# Patient Record
Sex: Male | Born: 1960 | Race: Black or African American | Hispanic: No | Marital: Single | Smoking: Current every day smoker
Health system: Southern US, Community
[De-identification: ages and names within clinical notes are randomized; demographics above are authoritative.]

## PROBLEM LIST (undated history)

## (undated) DIAGNOSIS — R569 Unspecified convulsions: Secondary | ICD-10-CM

## (undated) DIAGNOSIS — E538 Deficiency of other specified B group vitamins: Secondary | ICD-10-CM

## (undated) HISTORY — PX: NO PAST SURGERIES: SHX2092

---
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
## 2004-04-30 ENCOUNTER — Ambulatory Visit (HOSPITAL_COMMUNITY): Admission: RE | Admit: 2004-04-30 | Discharge: 2004-04-30 | Payer: Self-pay | Admitting: General Surgery

## 2004-12-07 ENCOUNTER — Emergency Department: Payer: Self-pay | Admitting: Emergency Medicine

## 2004-12-07 ENCOUNTER — Other Ambulatory Visit: Payer: Self-pay

## 2007-03-18 ENCOUNTER — Other Ambulatory Visit: Payer: Self-pay

## 2007-03-18 ENCOUNTER — Emergency Department: Payer: Self-pay | Admitting: Emergency Medicine

## 2008-07-16 ENCOUNTER — Ambulatory Visit (HOSPITAL_COMMUNITY): Admission: RE | Admit: 2008-07-16 | Discharge: 2008-07-16 | Payer: Self-pay | Admitting: Family Medicine

## 2008-07-16 IMAGING — CT CT HEAD W/O CM
1 series · 16 of 30 positions shown, 20 images · non-contrast
Comparison: None

CLINICAL DATA: Severe frontal headache.  Seizure disorder.

CT HEAD WITHOUT CONTRAST
TECHNIQUE: Contiguous axial images were obtained from the base of
the skull through the vertex without contrast

[Series 2: headseq 4.8 h37s · axial · 0.43mm/px · z∈[+132,+262]mm · 16 of 30 slices shown, 20 images]
[im 2/30  brain]
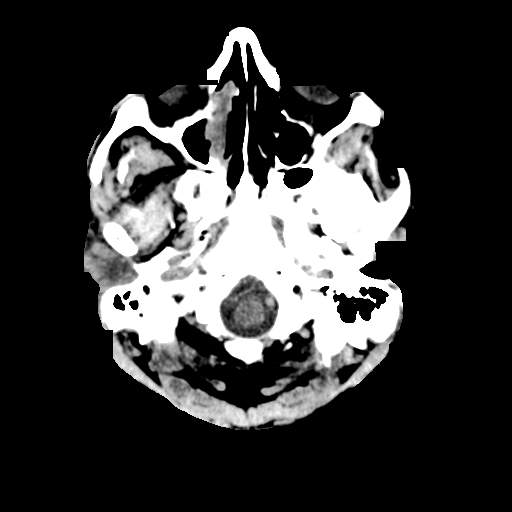
[im 2/30  bone]
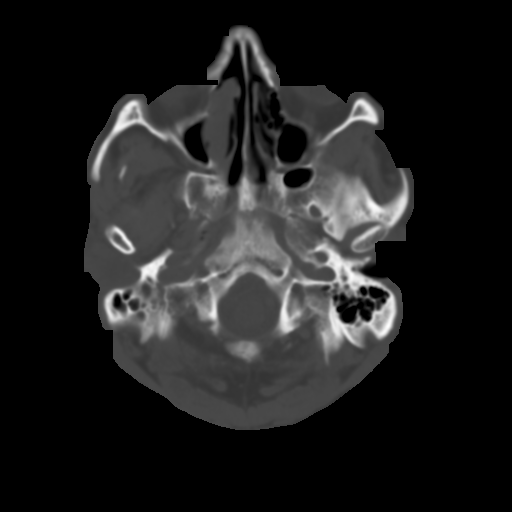
[im 4/30  brain]
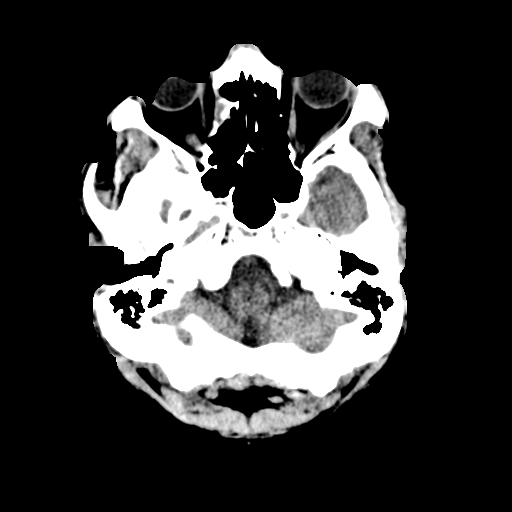
[im 6/30  brain]
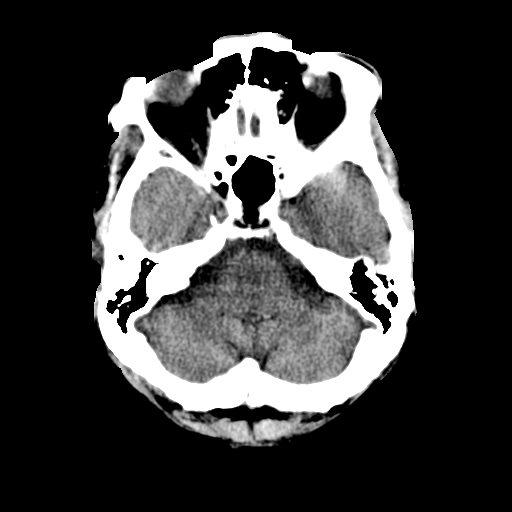
[im 8/30  brain]
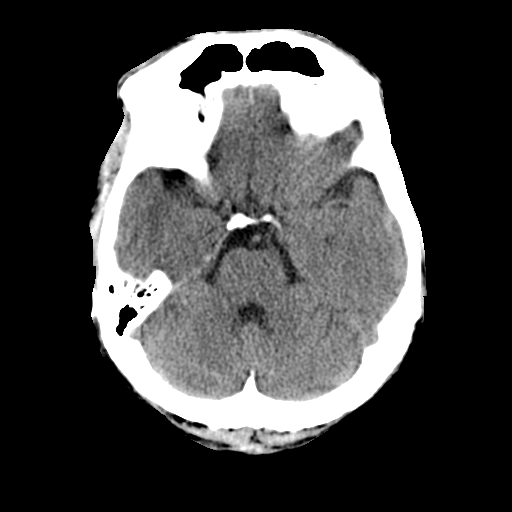
[im 9/30  brain]
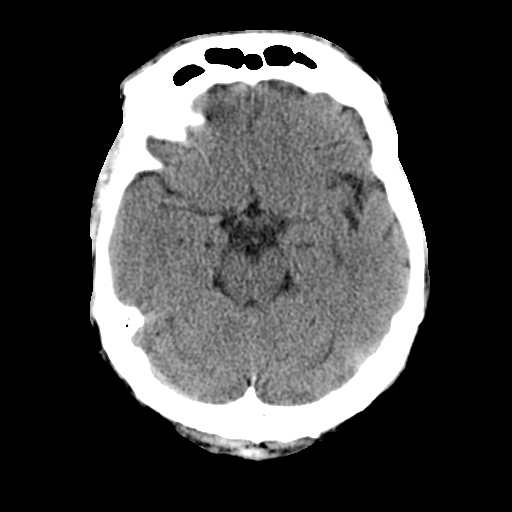
[im 9/30  bone]
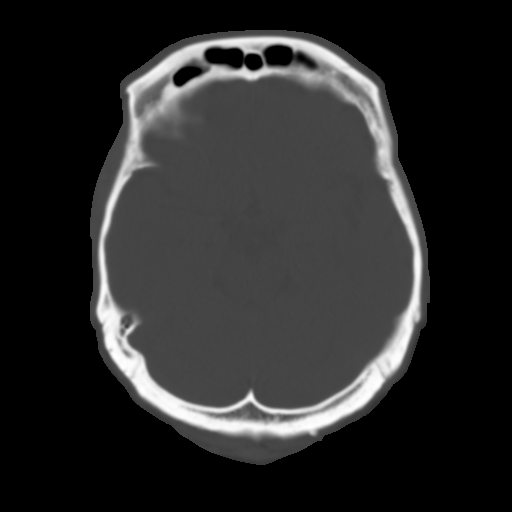
[im 11/30  brain]
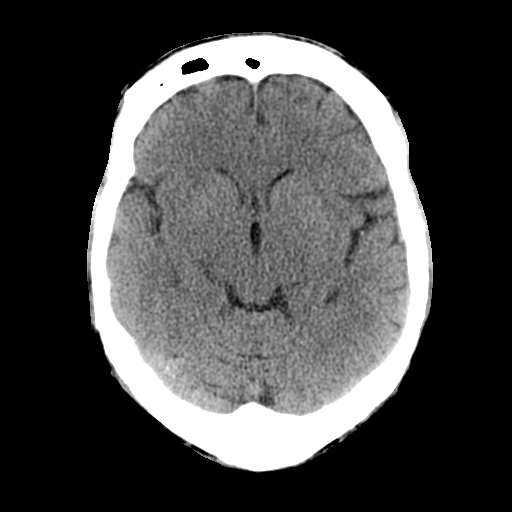
[im 13/30  brain]
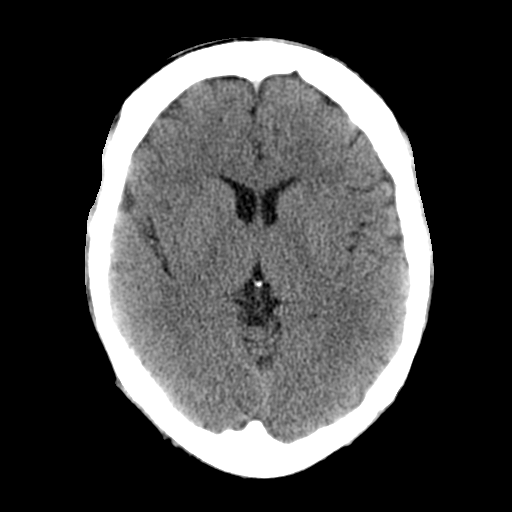
[im 15/30  brain]
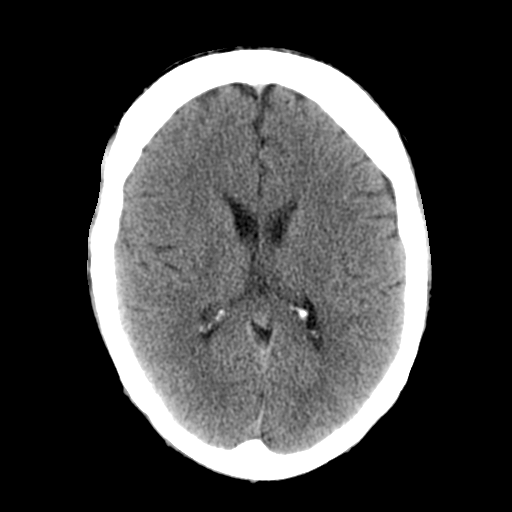
[im 16/30  brain]
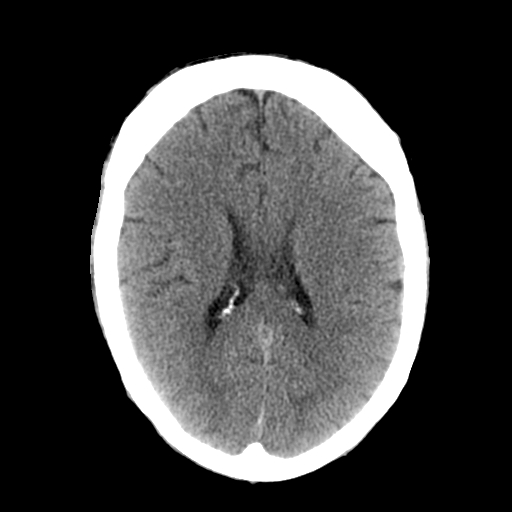
[im 16/30  bone]
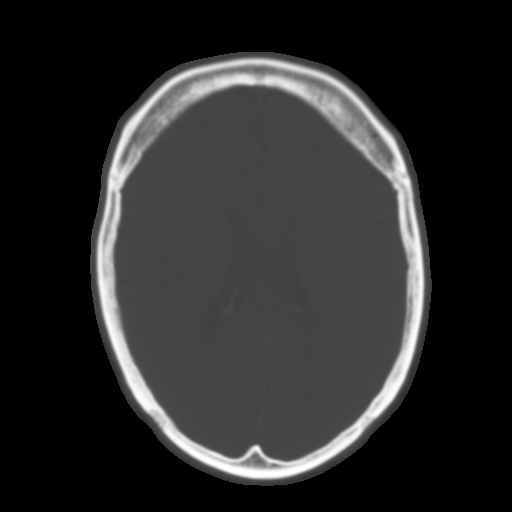
[im 18/30  brain]
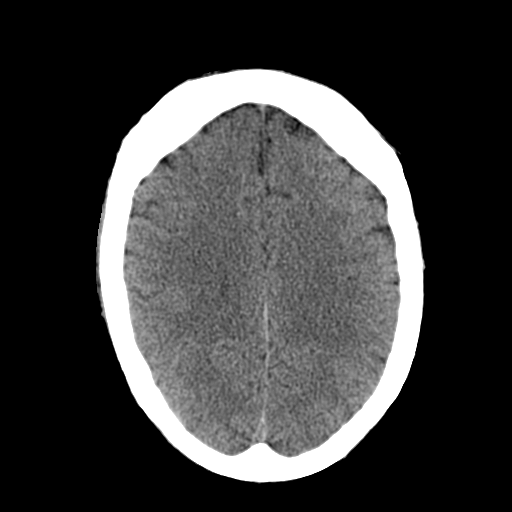
[im 20/30  brain]
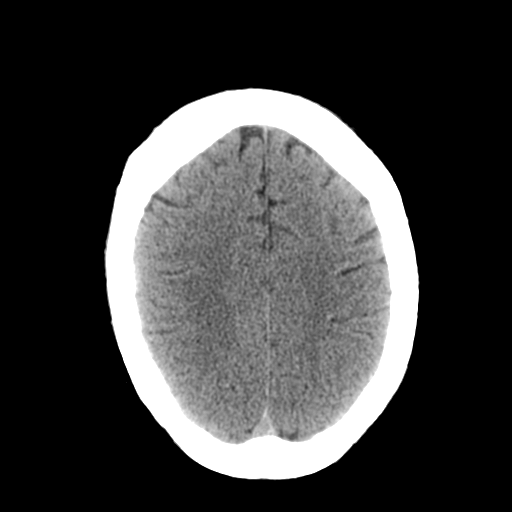
[im 22/30  brain]
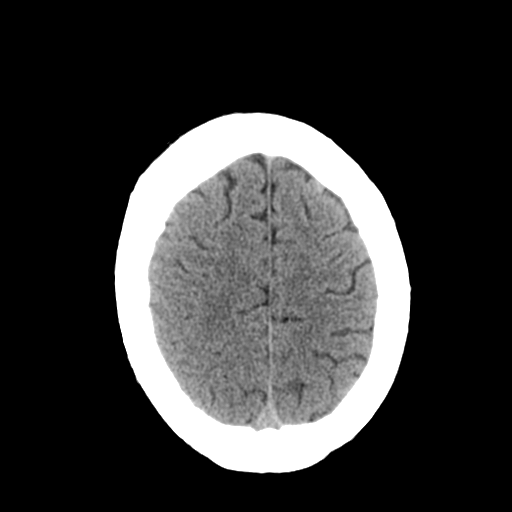
[im 23/30  brain]
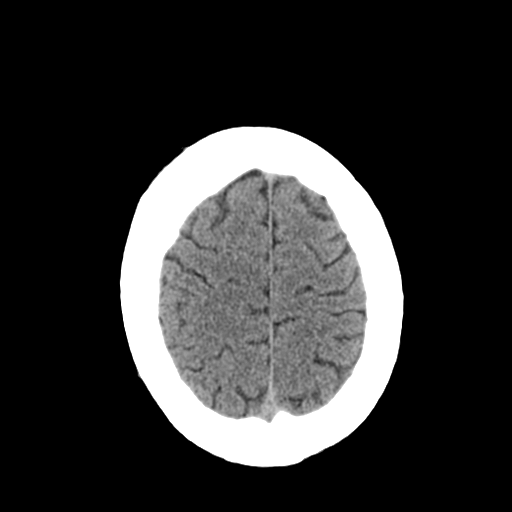
[im 23/30  bone]
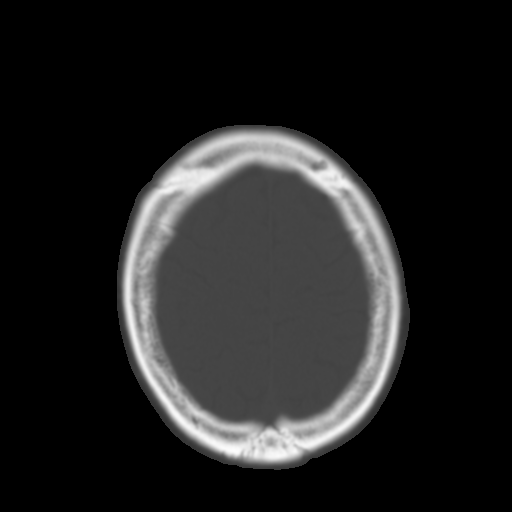
[im 25/30  brain]
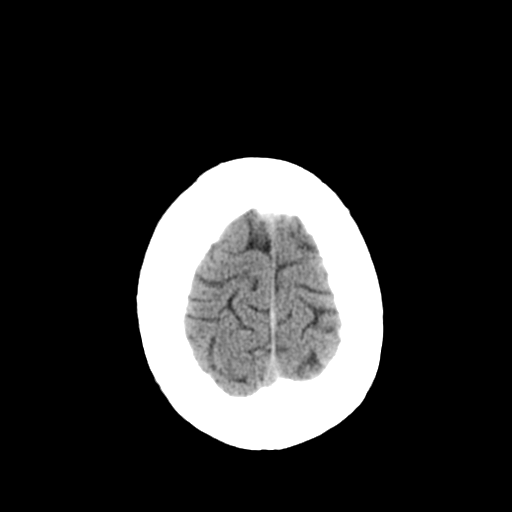
[im 27/30  brain]
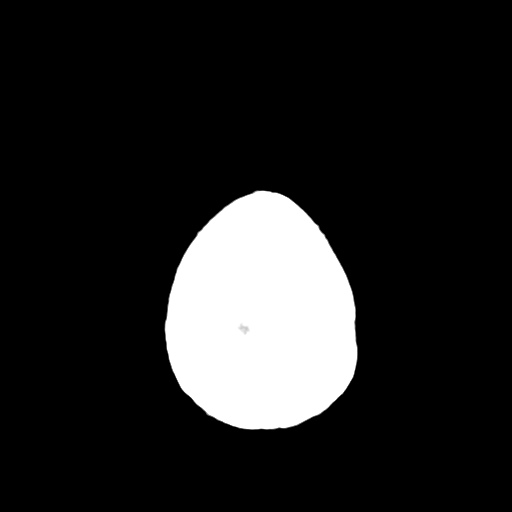
[im 29/30  brain]
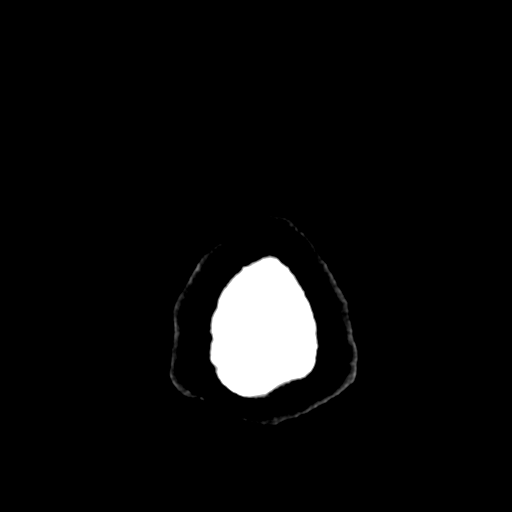

[16 of 30 positions shown; findings below may reference images not displayed]

FINDINGS: There is no evidence of intracranial hemorrhage, brain
edema, or other signs of acute infarction.  There is no evidence of
intracranial mass lesions, or mass effect.  No abnormal extraaxial
fluid collections are identified.  There is no evidence of
hydrocephalus, or other significant intracranial abnormality.  No
skull abnormality identified.
IMPRESSION: Negative non-contrast head CT.

## 2009-01-05 ENCOUNTER — Emergency Department: Payer: Self-pay | Admitting: Unknown Physician Specialty

## 2010-10-03 NOTE — H&P (Signed)
NAMEBROCKTON, MCKESSON NO.:  [id number]   MEDICAL RECORD NO.:  [id number]           PATIENT TYPE:   LOCATION:                                 FACILITY:   PHYSICIAN:  Dalia Heading, M.D.  DATE OF BIRTH:  [birth year]   DATE OF ADMISSION:  04/30/2004  DATE OF DISCHARGE:  LH                                HISTORY & PHYSICAL   CHIEF COMPLAINT:  Enlarging mass, face.   HISTORY OF PRESENT ILLNESS:  The patient is a 50 year old black male who  presents with an enlarging mass on his face.  It has been present for some  time but has recently increased in size.  No drainage has been noted.   PAST MEDICAL HISTORY:  Seizure disorder.  He has had seizures twice a month  since the age of 56.   PAST SURGICAL HISTORY:  Unremarkable.   CURRENT MEDICATIONS:  Dilantin 500 mg p.o. daily.  This was recently  increased.   ALLERGIES:  No known drug allergies.   REVIEW OF SYSTEMS:  The patient does drink and smoke.   PHYSICAL EXAMINATION:  GENERAL:  The patient is a well-developed, well-  nourished black male in no acute distress.  VITAL SIGNS:  He is afebrile, and vital signs are stable.  CHEST:  Lungs are clear to auscultation with equal breath sounds  bilaterally.  CARDIAC:  Regular rate and rhythm without S3, S4, or murmurs.  SKIN:  A 2 cm oval, subcutaneous, soft mass just lateral to the right eye.  A punctum may be present.  No drainage was noted.   IMPRESSION:  Enlarging mass, face.   PLAN:  The patient is scheduled for excision of the mass, face, on April 30, 2004.  The risks and benefits of the procedure, including bleeding,  infection, and seizures, were fully explained to the patient, who gave  informed consent.     Mark   MAJ/MEDQ  D:  04/22/2004  T:  04/22/2004  Job:  [id number]   cc:   [facility]  Fax: [phone]   Patrica Duel, M.D.  [address]  [place]  [place] [address]  Fax: [phone]

## 2010-10-03 NOTE — Op Note (Signed)
NAMERAYNER, ERMAN NO.:  [id number]   MEDICAL RECORD NO.:  [id number]          PATIENT TYPE:  AMB   LOCATION:  DAY                           FACILITY:  [facility]   PHYSICIAN:  Dalia Heading, M.D.  DATE OF BIRTH:  May 07, 1961   DATE OF PROCEDURE:  04/30/2004  DATE OF DISCHARGE:  04/30/2004                                 OPERATIVE REPORT   PREOPERATIVE DIAGNOSIS:  Enlarging mass, face.   POSTOPERATIVE DIAGNOSIS:  Inclusion cyst, face.   PROCEDURE:  Excision of cyst, face.   SURGEON:  Dr. Franky Macho   ANESTHESIA:  General.   INDICATIONS:  The patient is a 50 year old black male who has an enlarging  mass on his face, lateral to the right eye. The risks and benefits of the  procedure including bleeding, infection, and recurrence of the cyst were  fully explained to the patient, who gave informed consent.   PROCEDURE NOTE:  The patient was placed in the supine position. After  general anesthesia was administered, the right side of the face was prepped  and draped using the usual sterile technique with Betadine. Surgical site  confirmation was performed.   Incision was made over the mass. A sebaceous cyst was found. The cyst along  with its wall were removed without difficulty. The specimen was sent to  pathology for further examination.  Any bleeding was controlled using Bovie  electrocautery. The skin was reapproximated using 5-0 nylon interrupted  sutures. Neosporin ointment and dry sterile dressing were applied.   All tape and needle counts correct at the end of the procedure. The patient  was awakened and transferred to PACU in stable condition.   COMPLICATIONS:  None.   SPECIMEN:  Cyst, face.   BLOOD LOSS:  MinimalLoraine Leriche   MAJ/MEDQ  D:  06/03/2004  T:  06/03/2004  Job:  [id number]   cc:   Patrica Duel, M.D.  [address]  [place]  [place] [address]  Fax: [phone]

## 2013-02-27 ENCOUNTER — Ambulatory Visit (HOSPITAL_COMMUNITY)
Admission: RE | Admit: 2013-02-27 | Discharge: 2013-02-27 | Disposition: A | Discharge status: Home / Self Care | Payer: Medicaid Other | Source: Ambulatory Visit | Attending: Neurology | Admitting: Neurology

## 2013-02-27 ENCOUNTER — Other Ambulatory Visit: Payer: Self-pay | Admitting: Neurology

## 2013-02-27 DIAGNOSIS — M7989 Other specified soft tissue disorders: Secondary | ICD-10-CM | POA: Insufficient documentation

## 2013-02-27 DIAGNOSIS — M25562 Pain in left knee: Secondary | ICD-10-CM

## 2013-02-27 DIAGNOSIS — M79609 Pain in unspecified limb: Secondary | ICD-10-CM | POA: Insufficient documentation

## 2013-02-27 IMAGING — US US EXTREM LOW VENOUS*L*
1 series · 14 of 24 positions shown · non-contrast
Comparison: None.

CLINICAL DATA: Left leg pain and swelling

EXAM:
VENOUS DOPPLER ULTRASOUND OF LEFT LOWER EXTREMITY
TECHNIQUE: Gray-scale sonography with graded compression, as well as color
Doppler and duplex ultrasound, were performed to evaluate the deep
venous system from the level of the common femoral vein through the
popliteal and proximal calf veins. Spectral Doppler was utilized to
evaluate flow at rest and with distal augmentation maneuvers.

[Series 1: us extrem low venous*left* · 0.05mm/px · 14 of 28 slices shown]
[im 1/28]
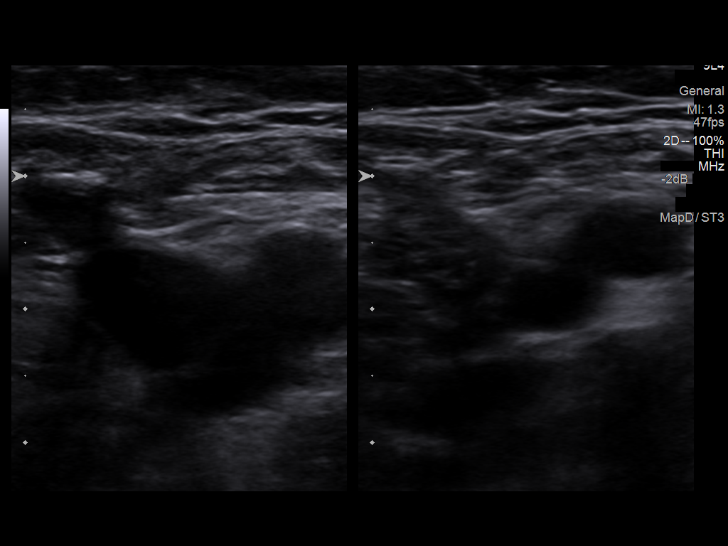
[im 3/28]
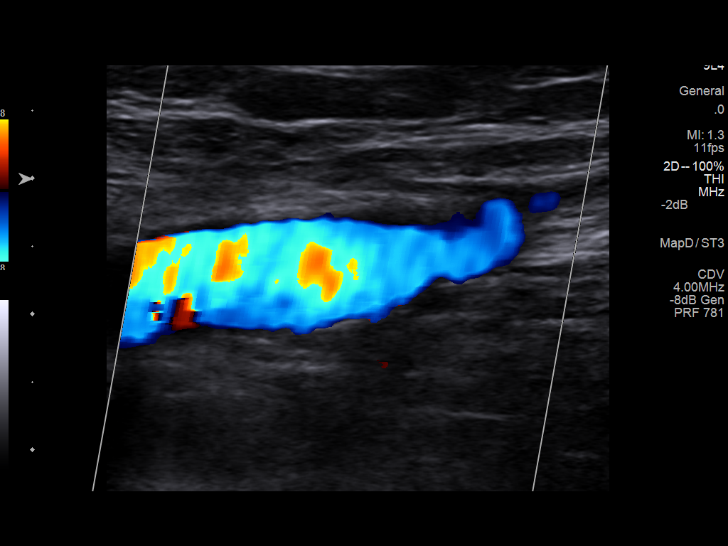
[im 5/28]
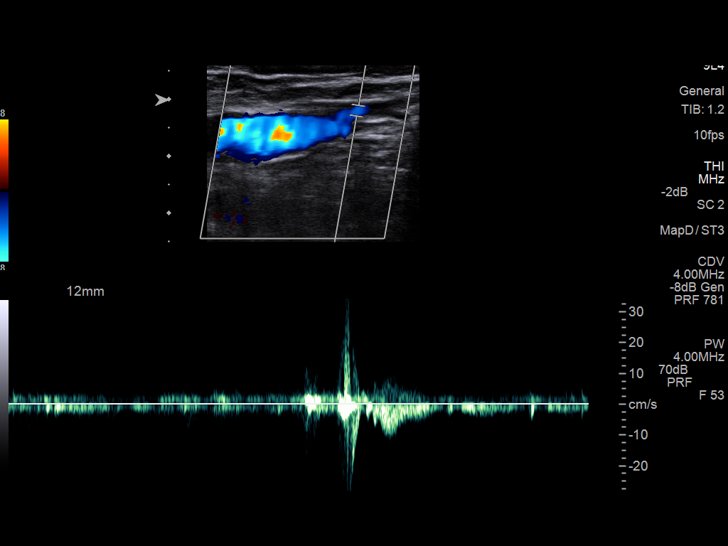
[im 8/28]
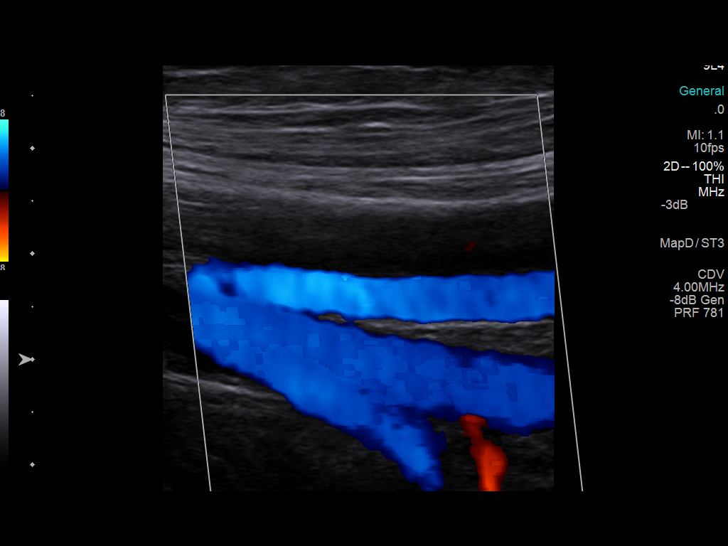
[im 9/28]
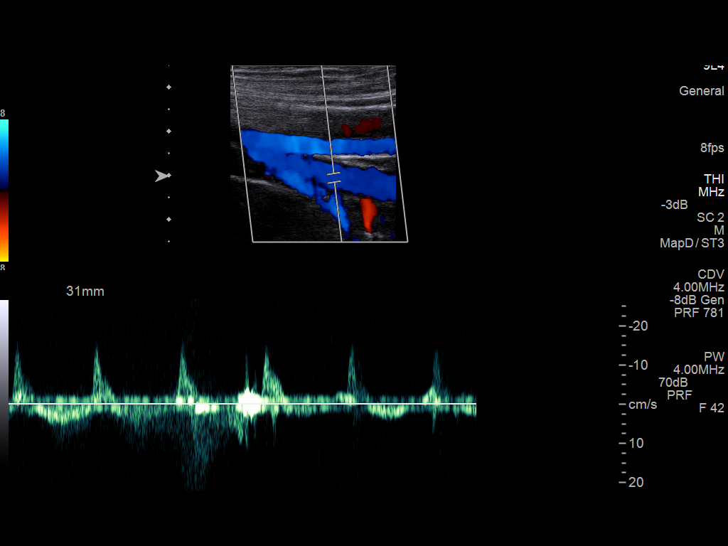
[im 11/28]
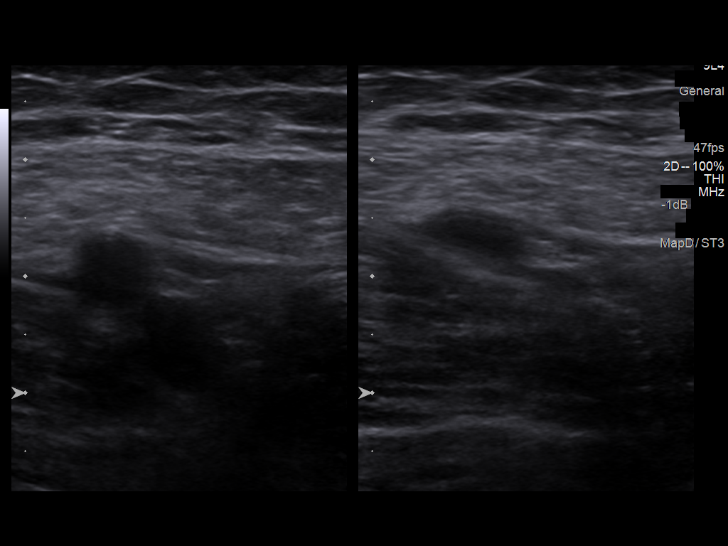
[im 13/28]
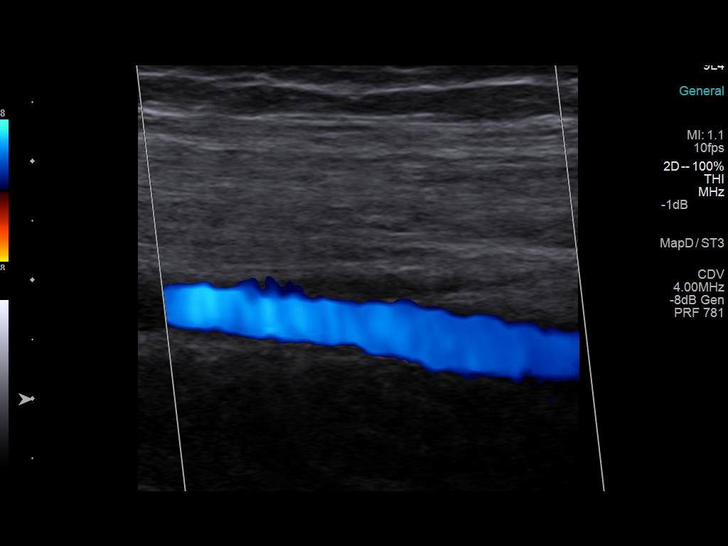
[im 15/28]
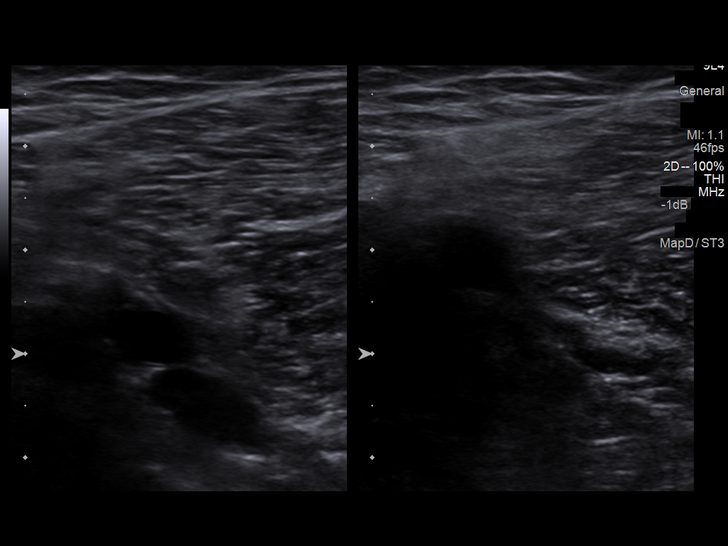
[im 17/28]
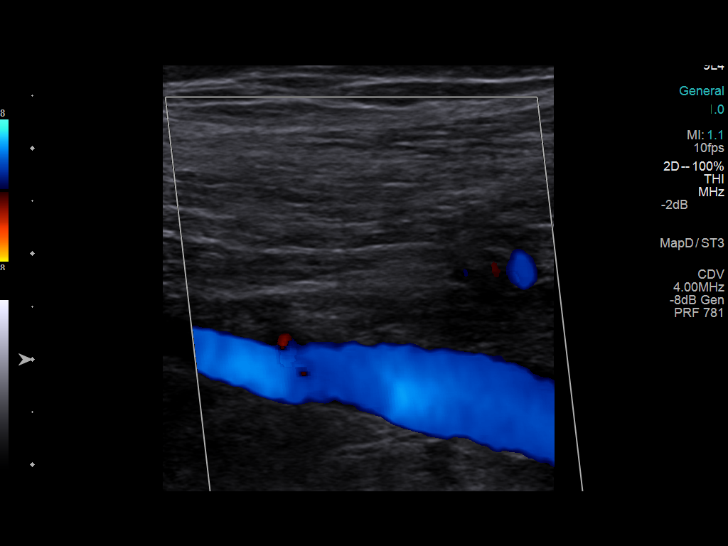
[im 19/28]
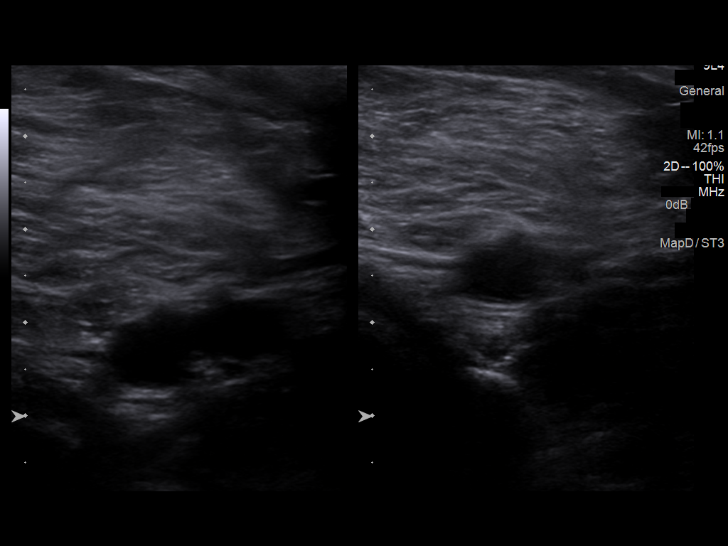
[im 22/28]
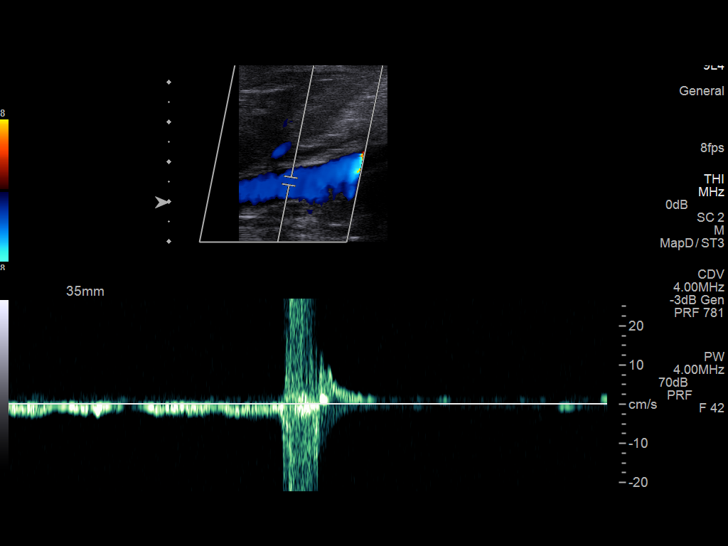
[im 23/28]
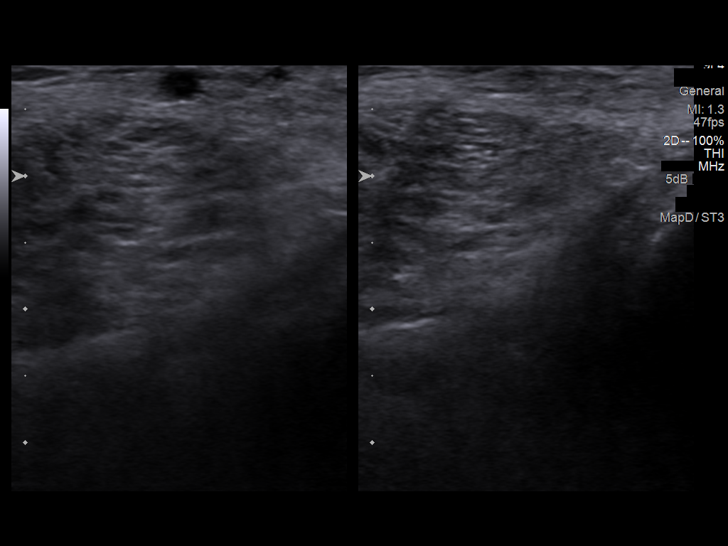
[im 25/28]
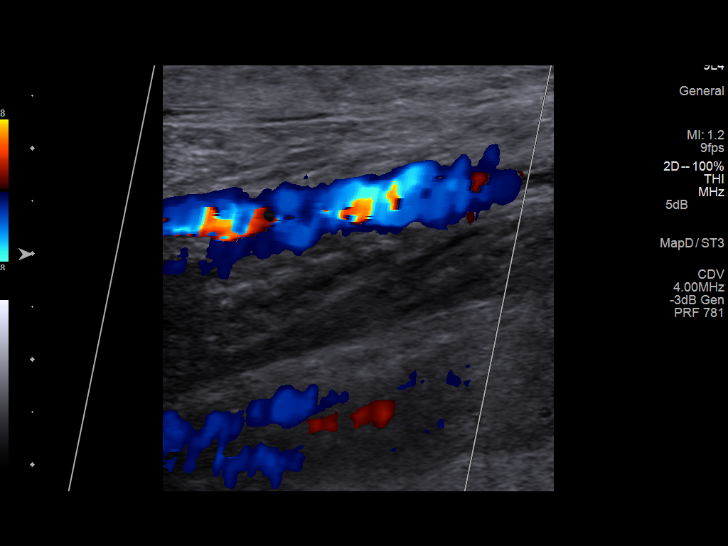
[im 28/28]
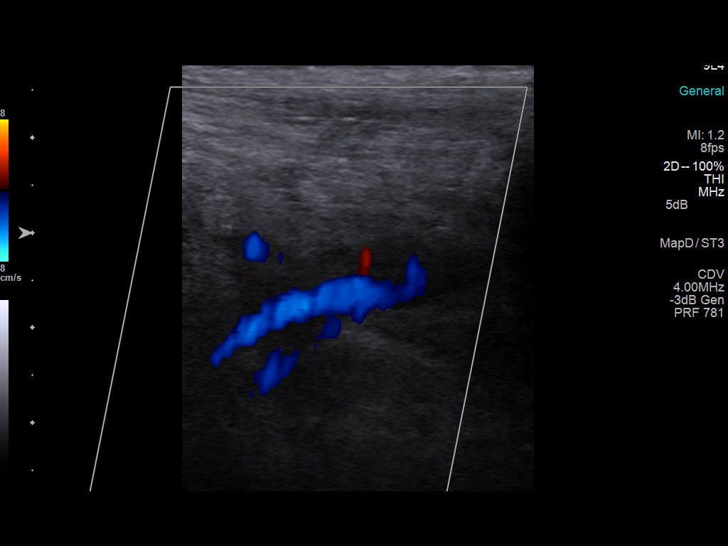

[14 of 24 positions shown; findings below may reference images not displayed]

FINDINGS: Thrombus within deep veins:  None visualized.

Compressibility of deep veins:  Normal.

Duplex waveform respiratory phasicity:  Normal.

Duplex waveform response to augmentation:  Normal.

Venous reflux:  None visualized.

Other findings:  None visualized.
IMPRESSION: No evidence of deep venous thrombosis in the left lower extremity.

## 2018-05-03 IMAGING — US RIGHT LOWER EXTREMITY VENOUS ULTRASOUND
1 series · 13 of 24 positions shown · non-contrast
Comparison: None.

CLINICAL DATA: Right lower extremity swelling for 3 weeks



[Series 1: right lower extremity venous ultrasound · 0.07mm/px · 13 of 35 slices shown]
[im 1/35]
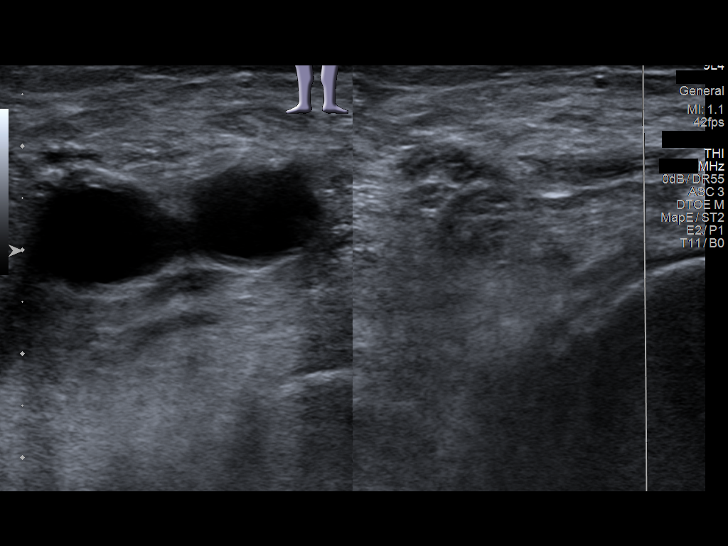
[im 3/35]
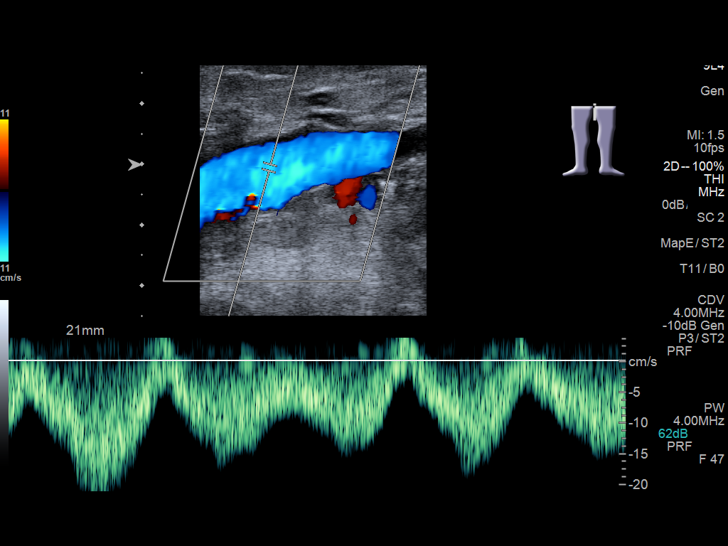
[im 6/35]
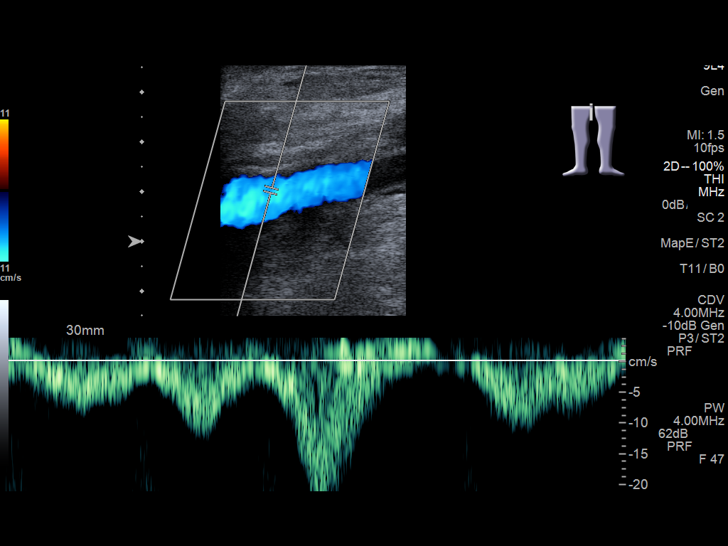
[im 9/35]
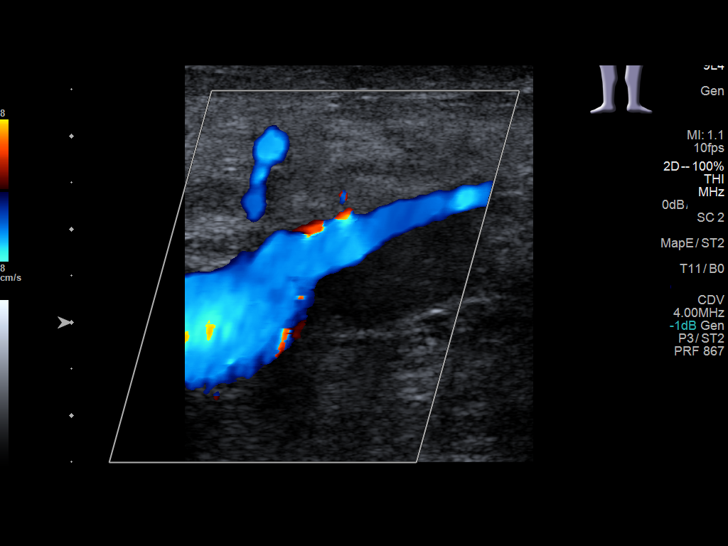
[im 12/35]
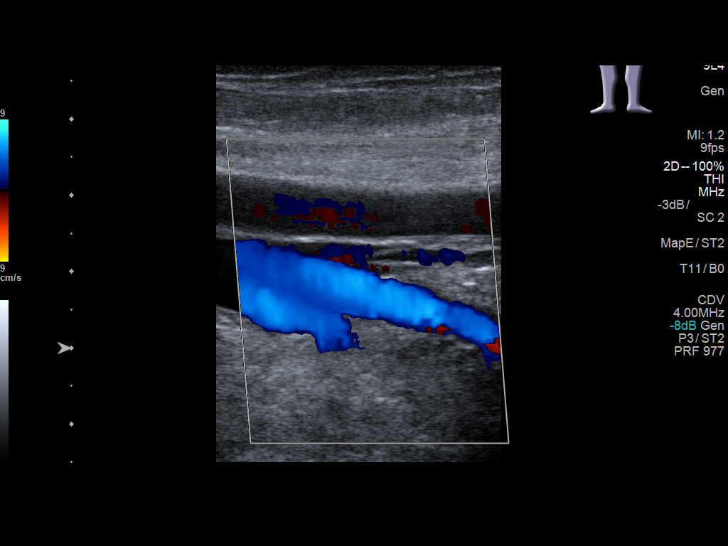
[im 15/35]
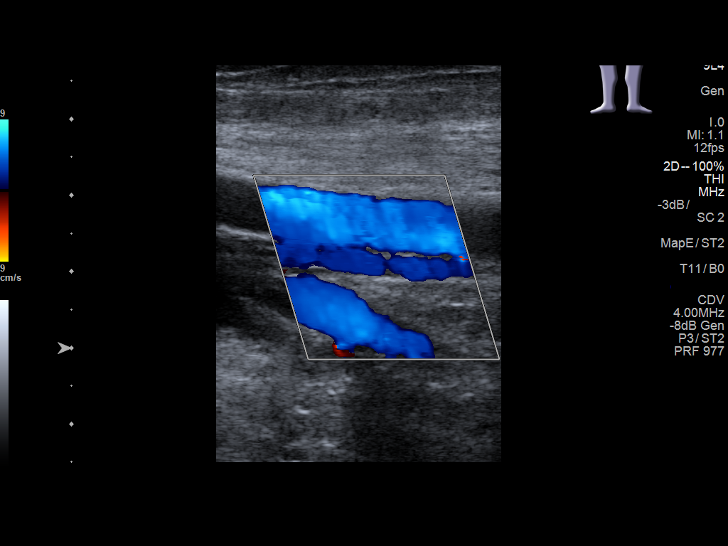
[im 18/35]
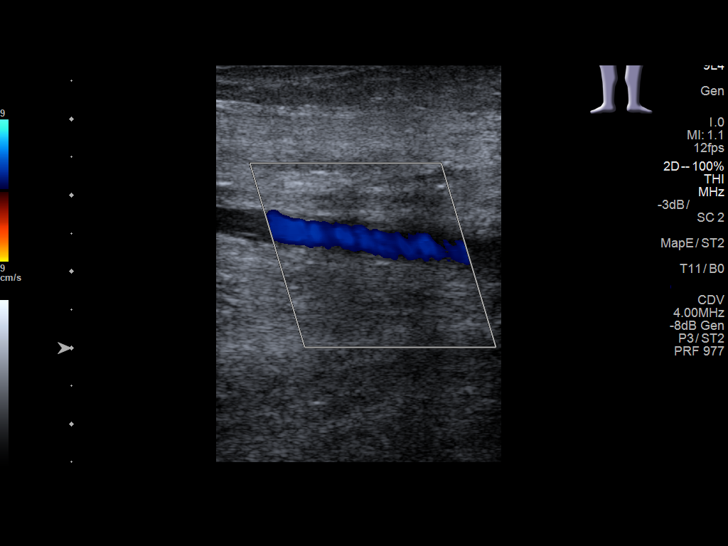
[im 20/35]
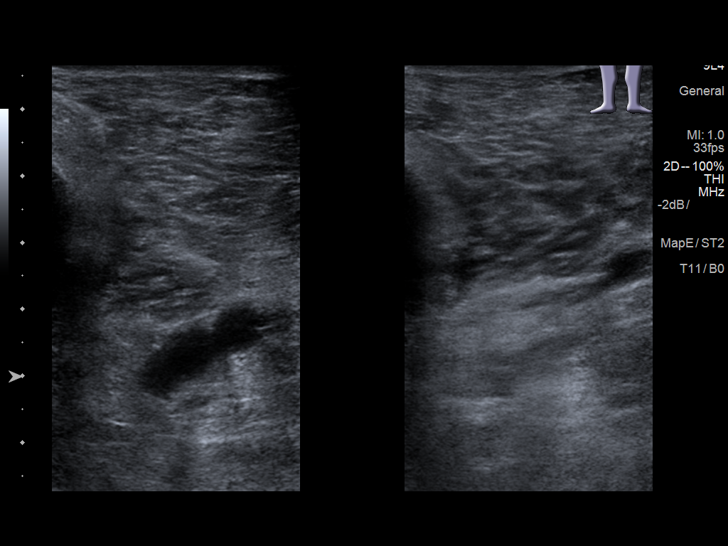
[im 23/35]
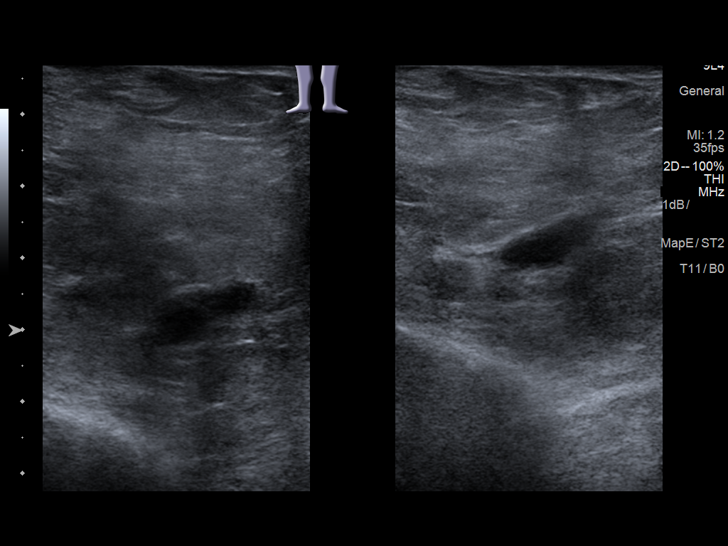
[im 26/35]
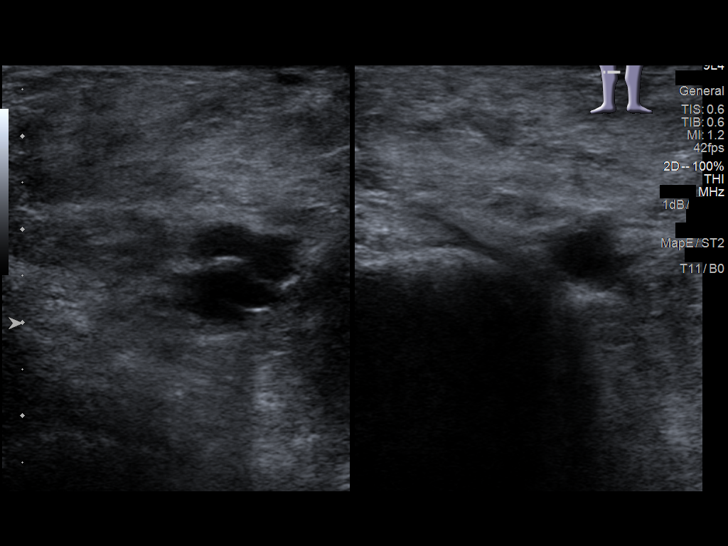
[im 29/35]
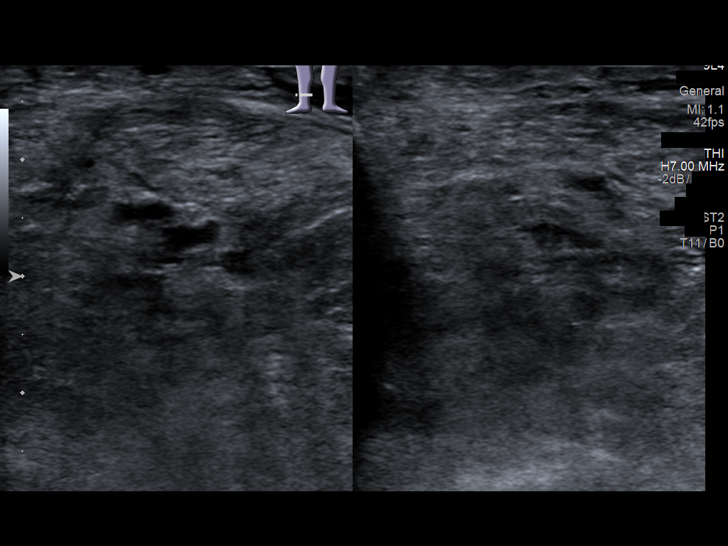
[im 32/35]
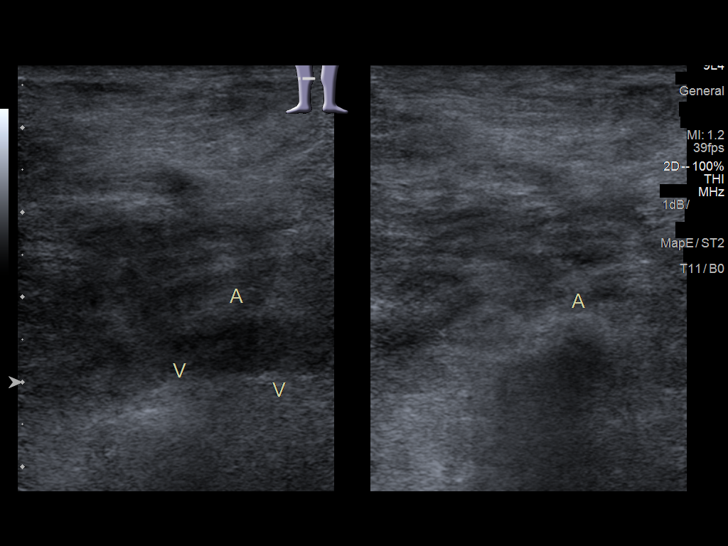
[im 35/35]
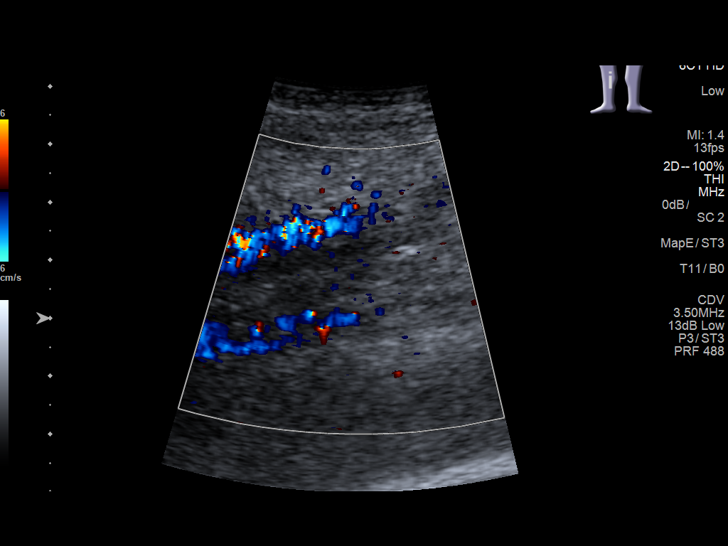

[13 of 24 positions shown; findings below may reference images not displayed]

FINDINGS: Contralateral Common Femoral Vein: Respiratory phasicity is normal
and symmetric with the symptomatic side. No evidence of thrombus.
Normal compressibility.

Common Femoral Vein: No evidence of thrombus. Normal
compressibility, respiratory phasicity and response to augmentation.

Saphenofemoral Junction: No evidence of thrombus. Normal
compressibility and flow on color Doppler imaging.

Profunda Femoral Vein: No evidence of thrombus. Normal
compressibility and flow on color Doppler imaging.

Femoral Vein: No evidence of thrombus. Normal compressibility,
respiratory phasicity and response to augmentation.

Popliteal Vein: No evidence of thrombus. Normal compressibility,
respiratory phasicity and response to augmentation.

Calf Veins: No evidence of thrombus. Normal compressibility and flow
on color Doppler imaging.

Superficial Great Saphenous Vein: No evidence of thrombus. Normal
compressibility.

Venous Reflux:  Not assessed

Other Findings:  None.
IMPRESSION: No evidence of deep venous thrombosis.

## 2018-07-08 ENCOUNTER — Emergency Department: Payer: Medicare Other

## 2018-07-08 ENCOUNTER — Inpatient Hospital Stay
Admission: EM | Admit: 2018-07-08 | Discharge: 2018-07-12 | DRG: 871 | Disposition: A | Discharge status: Home Health | Payer: Medicare Other | Attending: Internal Medicine | Admitting: Internal Medicine

## 2018-07-08 ENCOUNTER — Other Ambulatory Visit: Payer: Self-pay

## 2018-07-08 ENCOUNTER — Encounter: Payer: Self-pay | Admitting: Emergency Medicine

## 2018-07-08 DIAGNOSIS — Y92009 Unspecified place in unspecified non-institutional (private) residence as the place of occurrence of the external cause: Secondary | ICD-10-CM

## 2018-07-08 DIAGNOSIS — R402412 Glasgow coma scale score 13-15, at arrival to emergency department: Secondary | ICD-10-CM | POA: Diagnosis present

## 2018-07-08 DIAGNOSIS — K2210 Ulcer of esophagus without bleeding: Secondary | ICD-10-CM | POA: Diagnosis not present

## 2018-07-08 DIAGNOSIS — R1310 Dysphagia, unspecified: Secondary | ICD-10-CM

## 2018-07-08 DIAGNOSIS — G40909 Epilepsy, unspecified, not intractable, without status epilepticus: Secondary | ICD-10-CM | POA: Diagnosis present

## 2018-07-08 DIAGNOSIS — N390 Urinary tract infection, site not specified: Secondary | ICD-10-CM | POA: Diagnosis present

## 2018-07-08 DIAGNOSIS — E162 Hypoglycemia, unspecified: Secondary | ICD-10-CM | POA: Diagnosis present

## 2018-07-08 DIAGNOSIS — G9349 Other encephalopathy: Secondary | ICD-10-CM | POA: Diagnosis present

## 2018-07-08 DIAGNOSIS — R1319 Other dysphagia: Secondary | ICD-10-CM | POA: Diagnosis present

## 2018-07-08 DIAGNOSIS — F1020 Alcohol dependence, uncomplicated: Secondary | ICD-10-CM | POA: Diagnosis present

## 2018-07-08 DIAGNOSIS — M79651 Pain in right thigh: Secondary | ICD-10-CM | POA: Diagnosis present

## 2018-07-08 DIAGNOSIS — E876 Hypokalemia: Secondary | ICD-10-CM | POA: Diagnosis present

## 2018-07-08 DIAGNOSIS — W19XXXA Unspecified fall, initial encounter: Secondary | ICD-10-CM | POA: Diagnosis present

## 2018-07-08 DIAGNOSIS — S0181XA Laceration without foreign body of other part of head, initial encounter: Secondary | ICD-10-CM | POA: Diagnosis present

## 2018-07-08 DIAGNOSIS — Z91128 Patient's intentional underdosing of medication regimen for other reason: Secondary | ICD-10-CM

## 2018-07-08 DIAGNOSIS — H109 Unspecified conjunctivitis: Secondary | ICD-10-CM | POA: Diagnosis present

## 2018-07-08 DIAGNOSIS — A419 Sepsis, unspecified organism: Secondary | ICD-10-CM | POA: Diagnosis not present

## 2018-07-08 DIAGNOSIS — R296 Repeated falls: Secondary | ICD-10-CM | POA: Diagnosis present

## 2018-07-08 DIAGNOSIS — Z79899 Other long term (current) drug therapy: Secondary | ICD-10-CM | POA: Diagnosis not present

## 2018-07-08 DIAGNOSIS — Z8669 Personal history of other diseases of the nervous system and sense organs: Secondary | ICD-10-CM | POA: Diagnosis present

## 2018-07-08 DIAGNOSIS — J690 Pneumonitis due to inhalation of food and vomit: Secondary | ICD-10-CM | POA: Diagnosis present

## 2018-07-08 DIAGNOSIS — H1089 Other conjunctivitis: Secondary | ICD-10-CM | POA: Diagnosis present

## 2018-07-08 DIAGNOSIS — R4182 Altered mental status, unspecified: Secondary | ICD-10-CM | POA: Diagnosis not present

## 2018-07-08 DIAGNOSIS — Z8619 Personal history of other infectious and parasitic diseases: Secondary | ICD-10-CM | POA: Diagnosis present

## 2018-07-08 DIAGNOSIS — K210 Gastro-esophageal reflux disease with esophagitis: Secondary | ICD-10-CM | POA: Diagnosis present

## 2018-07-08 DIAGNOSIS — R569 Unspecified convulsions: Secondary | ICD-10-CM | POA: Diagnosis not present

## 2018-07-08 DIAGNOSIS — K7460 Unspecified cirrhosis of liver: Secondary | ICD-10-CM | POA: Diagnosis present

## 2018-07-08 DIAGNOSIS — T426X6A Underdosing of other antiepileptic and sedative-hypnotic drugs, initial encounter: Secondary | ICD-10-CM | POA: Diagnosis present

## 2018-07-08 DIAGNOSIS — R0602 Shortness of breath: Secondary | ICD-10-CM

## 2018-07-08 DIAGNOSIS — F17200 Nicotine dependence, unspecified, uncomplicated: Secondary | ICD-10-CM | POA: Diagnosis present

## 2018-07-08 DIAGNOSIS — D6959 Other secondary thrombocytopenia: Secondary | ICD-10-CM | POA: Diagnosis present

## 2018-07-08 DIAGNOSIS — K0889 Other specified disorders of teeth and supporting structures: Secondary | ICD-10-CM | POA: Diagnosis present

## 2018-07-08 DIAGNOSIS — I959 Hypotension, unspecified: Secondary | ICD-10-CM

## 2018-07-08 DIAGNOSIS — N3000 Acute cystitis without hematuria: Secondary | ICD-10-CM

## 2018-07-08 DIAGNOSIS — F1010 Alcohol abuse, uncomplicated: Secondary | ICD-10-CM | POA: Diagnosis present

## 2018-07-08 DIAGNOSIS — G9340 Encephalopathy, unspecified: Secondary | ICD-10-CM | POA: Diagnosis present

## 2018-07-08 HISTORY — DX: Unspecified convulsions: R56.9

## 2018-07-08 LAB — COMPREHENSIVE METABOLIC PANEL
ALT: 22 U/L (ref 0–44)
AST: 40 U/L (ref 15–41)
Albumin: 2.6 g/dL — ABNORMAL LOW (ref 3.5–5.0)
Alkaline Phosphatase: 104 U/L (ref 38–126)
Anion gap: 11 (ref 5–15)
BUN: 6 mg/dL (ref 6–20)
CHLORIDE: 98 mmol/L (ref 98–111)
CO2: 26 mmol/L (ref 22–32)
CREATININE: 1.02 mg/dL (ref 0.61–1.24)
Calcium: 8 mg/dL — ABNORMAL LOW (ref 8.9–10.3)
GFR calc non Af Amer: >60 mL/min (ref >60)
Glucose, Bld: 68 mg/dL — ABNORMAL LOW (ref 70–99)
POTASSIUM: 3.1 mmol/L — AB (ref 3.5–5.1)
Sodium: 135 mmol/L (ref 135–145)
Total Bilirubin: 0.8 mg/dL (ref 0.3–1.2)
Total Protein: 5.9 g/dL — ABNORMAL LOW (ref 6.5–8.1)

## 2018-07-08 LAB — URINALYSIS, COMPLETE (UACMP) WITH MICROSCOPIC
BILIRUBIN URINE: NEGATIVE
GLUCOSE, UA: 50 mg/dL — AB
Ketones, ur: 20 mg/dL — AB
Nitrite: NEGATIVE
PROTEIN: NEGATIVE mg/dL
Specific Gravity, Urine: 1.01 (ref 1.005–1.030)
pH: 5 (ref 5.0–8.0)

## 2018-07-08 LAB — CBC
HEMATOCRIT: 31 % — AB (ref 39.0–52.0)
Hemoglobin: 10 g/dL — ABNORMAL LOW (ref 13.0–17.0)
MCH: 28.7 pg (ref 26.0–34.0)
MCHC: 32.3 g/dL (ref 30.0–36.0)
MCV: 89.1 fL (ref 80.0–100.0)
Platelets: 81 10*3/uL — ABNORMAL LOW (ref 150–400)
RBC: 3.48 MIL/uL — ABNORMAL LOW (ref 4.22–5.81)
RDW: 14.2 % (ref 11.5–15.5)
WBC: 16 10*3/uL — ABNORMAL HIGH (ref 4.0–10.5)
nRBC: 0 % (ref 0.0–0.2)

## 2018-07-08 LAB — BLOOD GAS, VENOUS
Acid-base deficit: 2.9 mmol/L — ABNORMAL HIGH (ref 0.0–2.0)
Bicarbonate: 22 mmol/L (ref 20.0–28.0)
O2 Saturation: 16 %
Patient temperature: 37
pCO2, Ven: 38 mmHg — ABNORMAL LOW (ref 44.0–60.0)
pH, Ven: 7.37 (ref 7.250–7.430)
pO2, Ven: <31 mmHg — CL (ref 32.0–45.0)

## 2018-07-08 LAB — LIPASE, BLOOD: LIPASE: 16 U/L (ref 11–51)

## 2018-07-08 LAB — ETHANOL: Alcohol, Ethyl (B): <10 mg/dL (ref <10)

## 2018-07-08 LAB — GLUCOSE, CAPILLARY: Glucose-Capillary: 57 mg/dL — ABNORMAL LOW (ref 70–99)

## 2018-07-08 LAB — URINE DRUG SCREEN, QUALITATIVE (ARMC ONLY)
Amphetamines, Ur Screen: NOT DETECTED
Barbiturates, Ur Screen: NOT DETECTED
Benzodiazepine, Ur Scrn: NOT DETECTED
Cannabinoid 50 Ng, Ur ~~LOC~~: NOT DETECTED
Cocaine Metabolite,Ur ~~LOC~~: NOT DETECTED
MDMA (Ecstasy)Ur Screen: NOT DETECTED
Methadone Scn, Ur: NOT DETECTED
Opiate, Ur Screen: NOT DETECTED
Phencyclidine (PCP) Ur S: NOT DETECTED
Tricyclic, Ur Screen: NOT DETECTED

## 2018-07-08 LAB — INFLUENZA PANEL BY PCR (TYPE A & B)
Influenza A By PCR: NEGATIVE
Influenza B By PCR: NEGATIVE

## 2018-07-08 LAB — LACTIC ACID, PLASMA: Lactic Acid, Venous: 0.7 mmol/L (ref 0.5–1.9)

## 2018-07-08 LAB — TROPONIN I: Troponin I: <0.03 ng/mL (ref <0.03)

## 2018-07-08 LAB — CK: Total CK: 616 U/L — ABNORMAL HIGH (ref 49–397)

## 2018-07-08 IMAGING — CT CT HEAD W/O CM
4 of 6 series · 16 of 47 positions shown, 18 images · non-contrast
Comparison: None.

CLINICAL DATA: Unwitnessed seizure on [REDACTED]. Patient drinks
alcohol daily.

EXAM:
CT HEAD WITHOUT CONTRAST
CT CERVICAL SPINE WITHOUT CONTRAST
TECHNIQUE: Multidetector CT imaging of the head and cervical spine was
performed following the standard protocol without intravenous
contrast. Multiplanar CT image reconstructions of the cervical spine
were also generated.

[Series 2: head wo · axial · 0.42mm/px · z∈[+264,+354]mm · 5 of 28 slices shown, 7 images]
[im 5/28  brain]
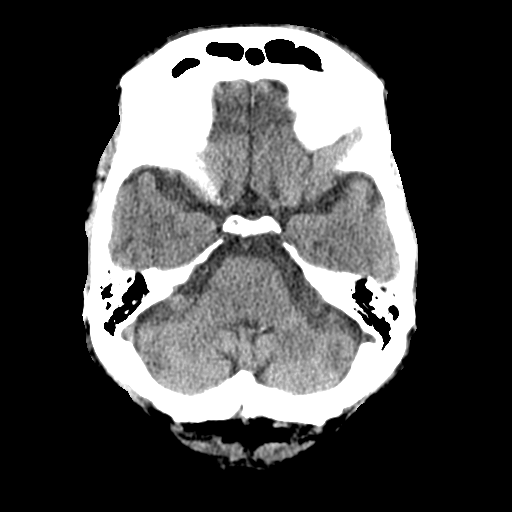
[im 5/28  bone]
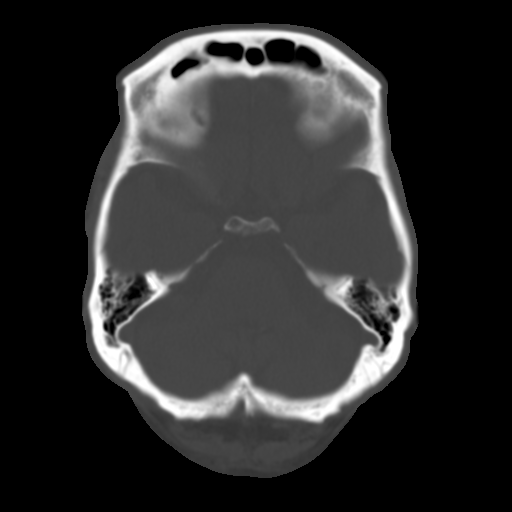
[im 10/28  brain]
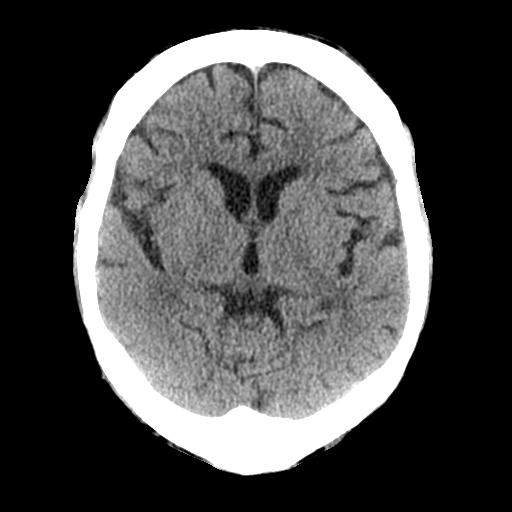
[im 14/28  brain]
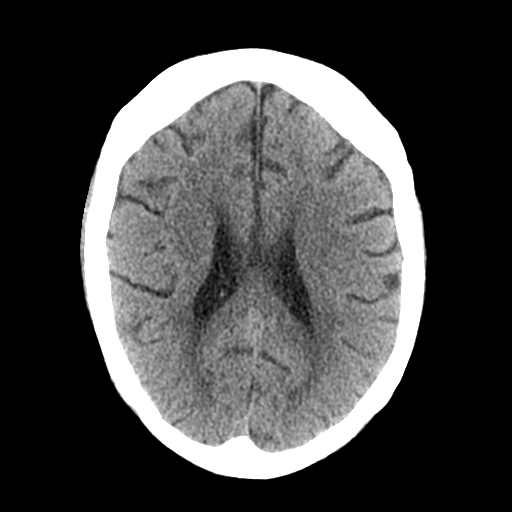
[im 19/28  brain]
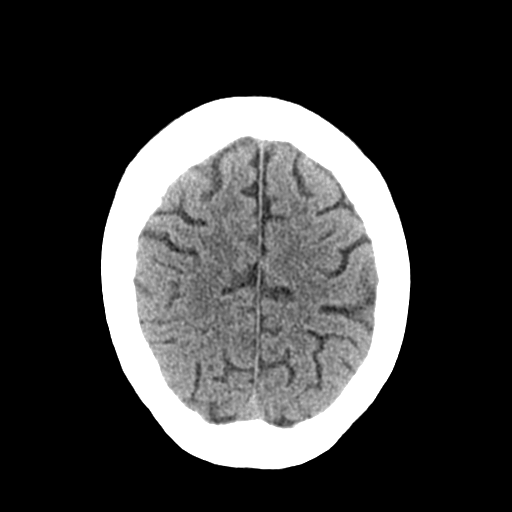
[im 23/28  brain]
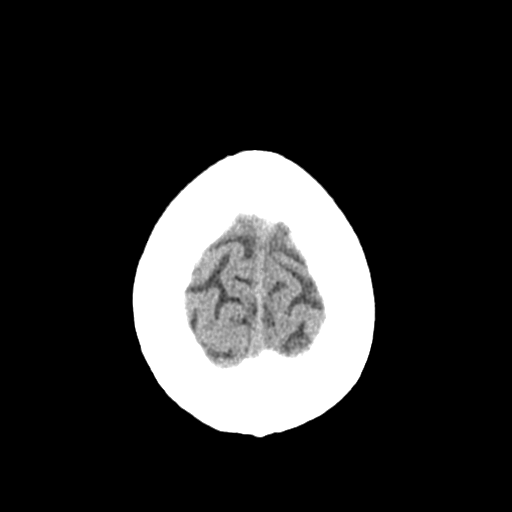
[im 23/28  bone]
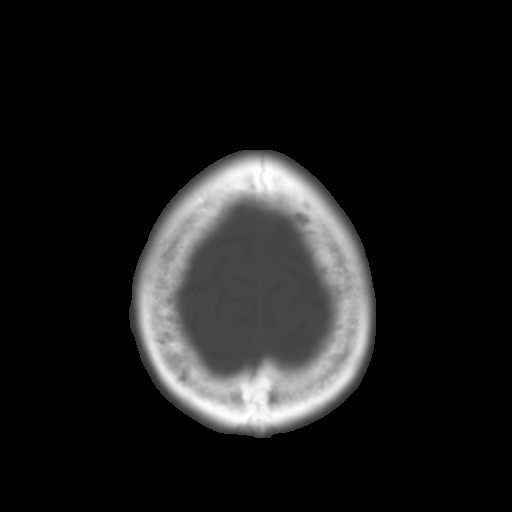

[Series 4: coronal soft tissue · coronal · 0.27mm/px · 3 of 65 slices shown]
[im 19/65  brain]
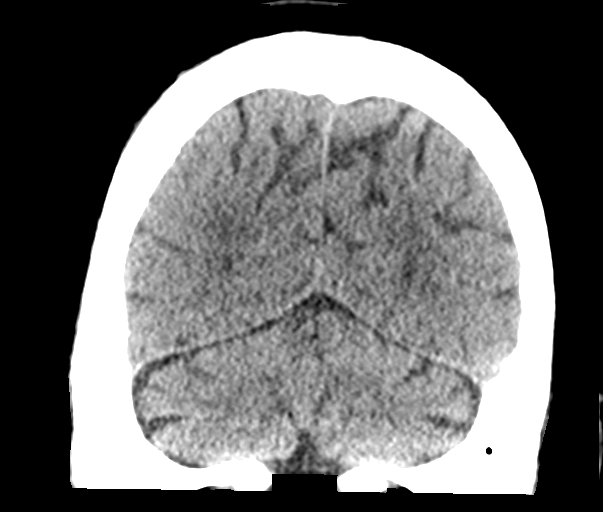
[im 28/65  brain]
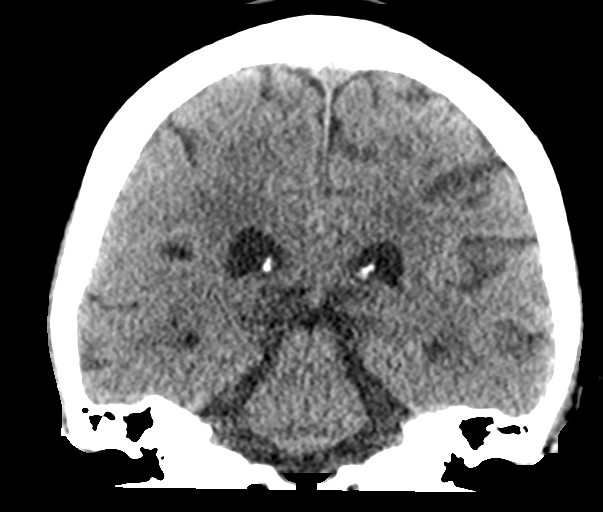
[im 37/65  brain]
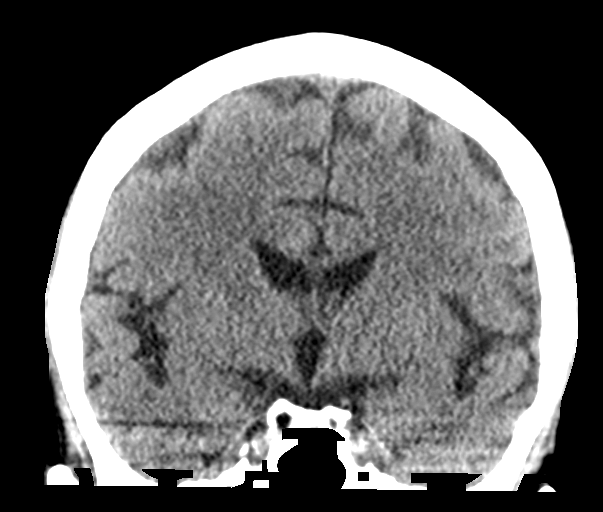

[Series 5: sagittal soft tissue · sagittal · 0.28mm/px · 1 of 52 slices shown]
[im 26/52  brain]
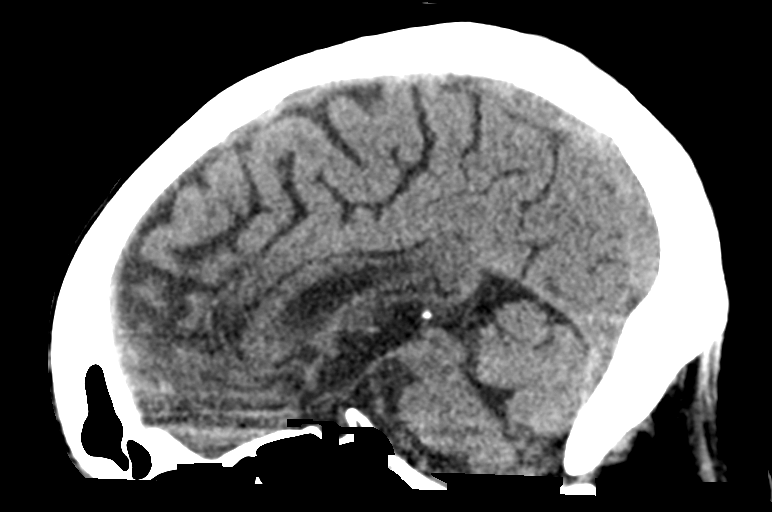

[Series 7: c spine soft · axial · 0.35mm/px · z∈[+104,+232]mm · 7 of 90 slices shown]
[im 9/90  brain]
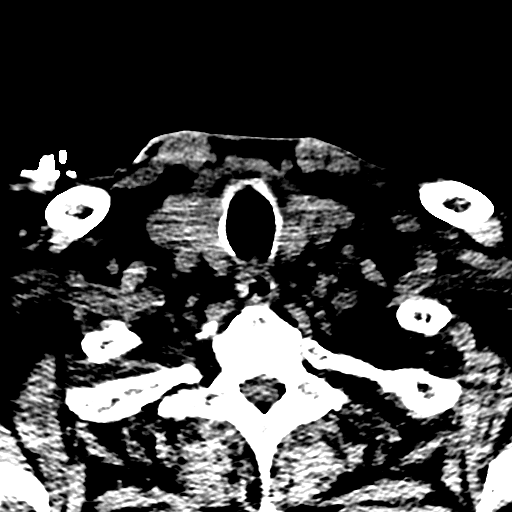
[im 17/90  brain]
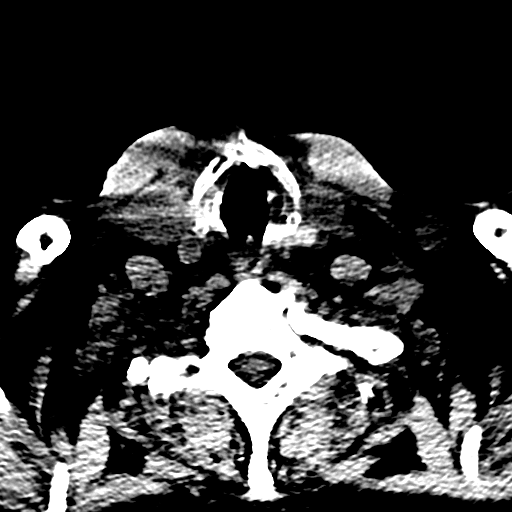
[im 29/90  brain]
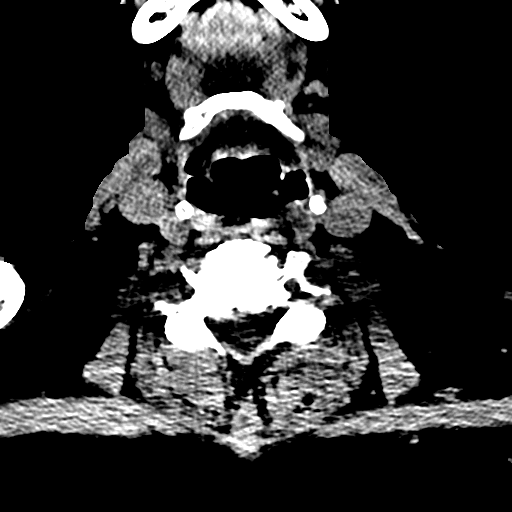
[im 41/90  brain]
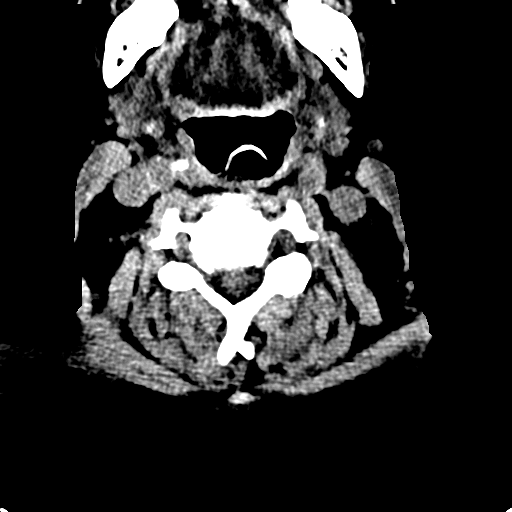
[im 49/90  brain]
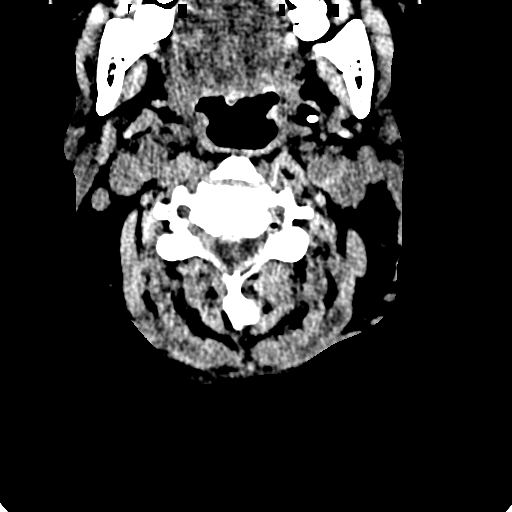
[im 61/90  brain]
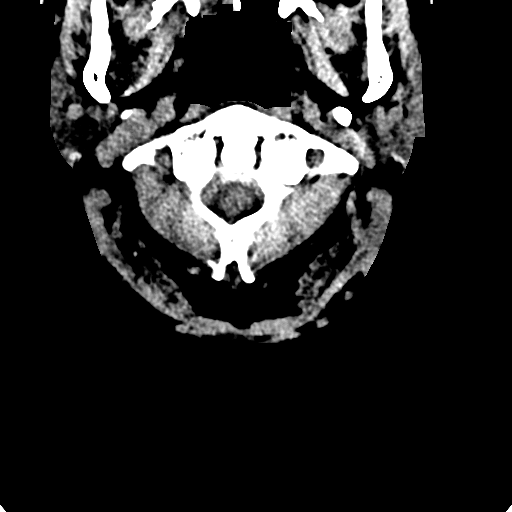
[im 73/90  brain]
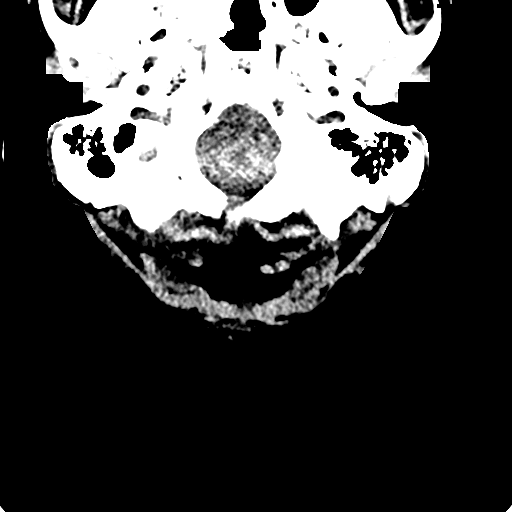

[16 of 47 positions shown; findings below may reference images not displayed]

FINDINGS: CT HEAD FINDINGS

Brain: Involutional changes the brain slightly advanced for age with
chronic appearing microvascular ischemic disease. No large vascular
territory infarct, hemorrhage, midline shift or edema. No
intra-axial mass nor extra-axial fluid. No hydrocephalus. Midline
fourth ventricle basal cisterns without effacement.

Vascular: Mild atherosclerosis of the carotid siphons. No hyperdense
vessels.

Skull: No skull fracture.

Sinuses/Orbits: Intact orbits and globes. Ethmoid sinus mucosal
thickening is noted on right.

Other: Small left frontal scalp contusion with swelling.

CT CERVICAL SPINE FINDINGS

Alignment: Reversal cervical lordosis.

Skull base and vertebrae: No acute fracture. No primary bone lesion
or focal pathologic process.

Soft tissues and spinal canal: No prevertebral fluid or swelling. No
visible canal hematoma.

Disc levels: No significant central foraminal stenosis. No jumped or
perched facets. Uncovertebral joint osteoarthritis with spurring
bilaterally at C2-3, C3-4 and C6-7.

Upper chest: Clear

Other: None
IMPRESSION: 1. Small left frontal scalp contusion with swelling. No underlying
skull fracture.
2. Chronic appearing minimal small vessel ischemic disease of
periventricular white matter. No acute intracranial abnormality.
3. No acute posttraumatic cervical spine fracture or subluxation.

## 2018-07-08 IMAGING — CR DG FEMUR 2+V*R*
4 series · 4 of 4 positions shown · non-contrast
Comparison: None.

CLINICAL DATA: Fall with leg pain

EXAM:
RIGHT FEMUR 2 VIEWS

[femur ap (1 of 2)]
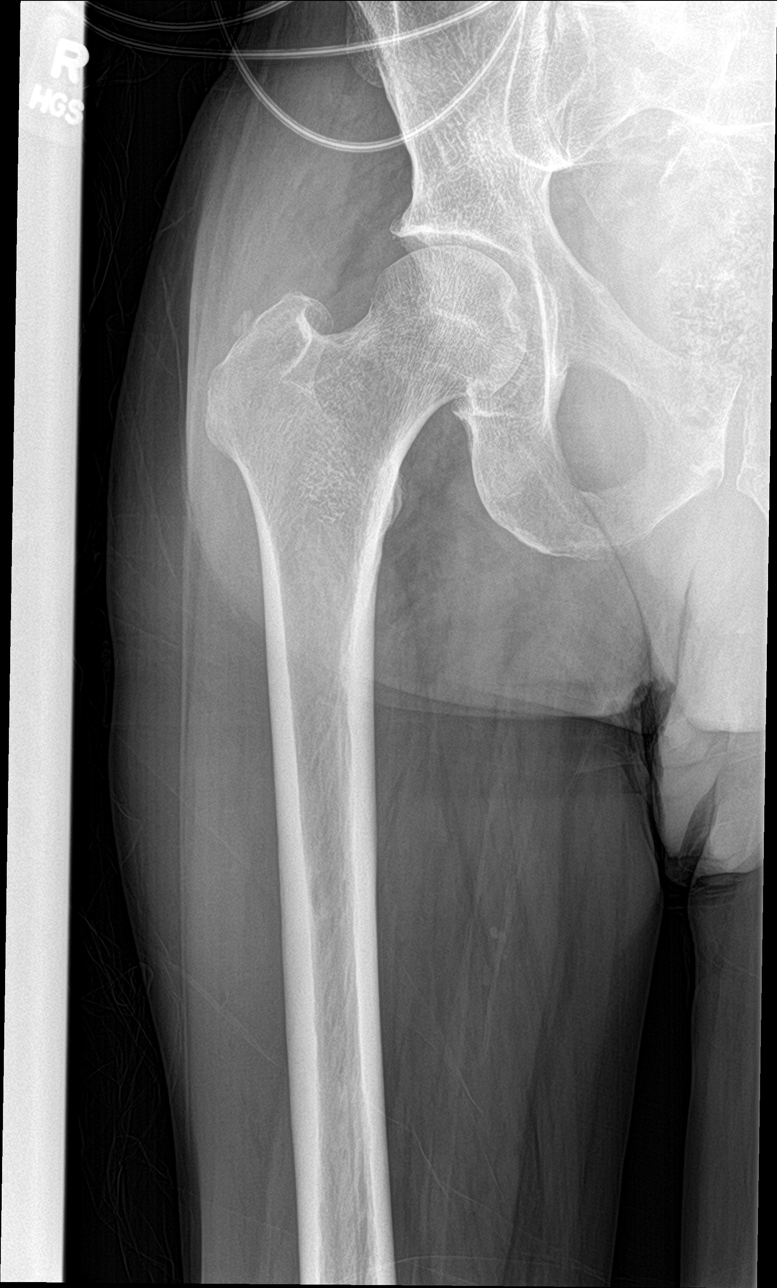

[femur ap (2 of 2)]
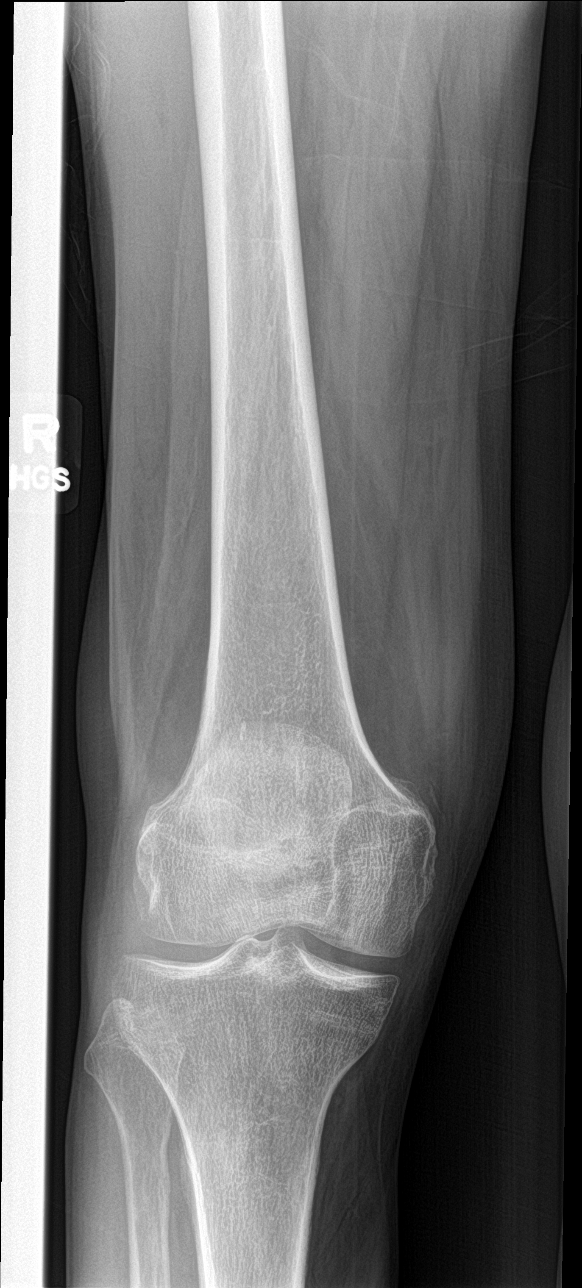

[femur lat (1 of 2)]
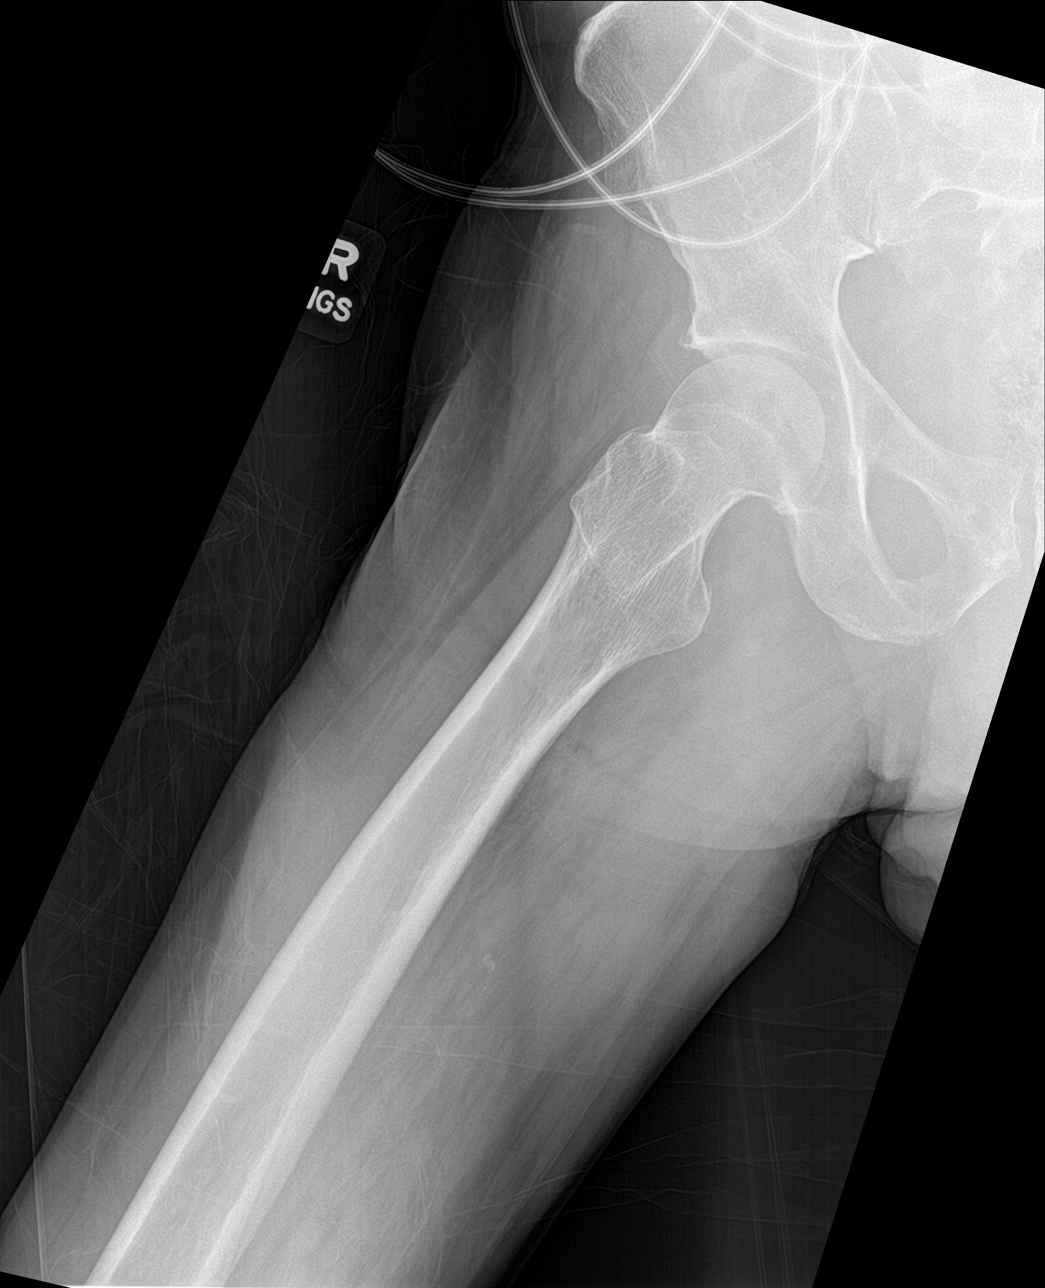

[femur lat (2 of 2)]
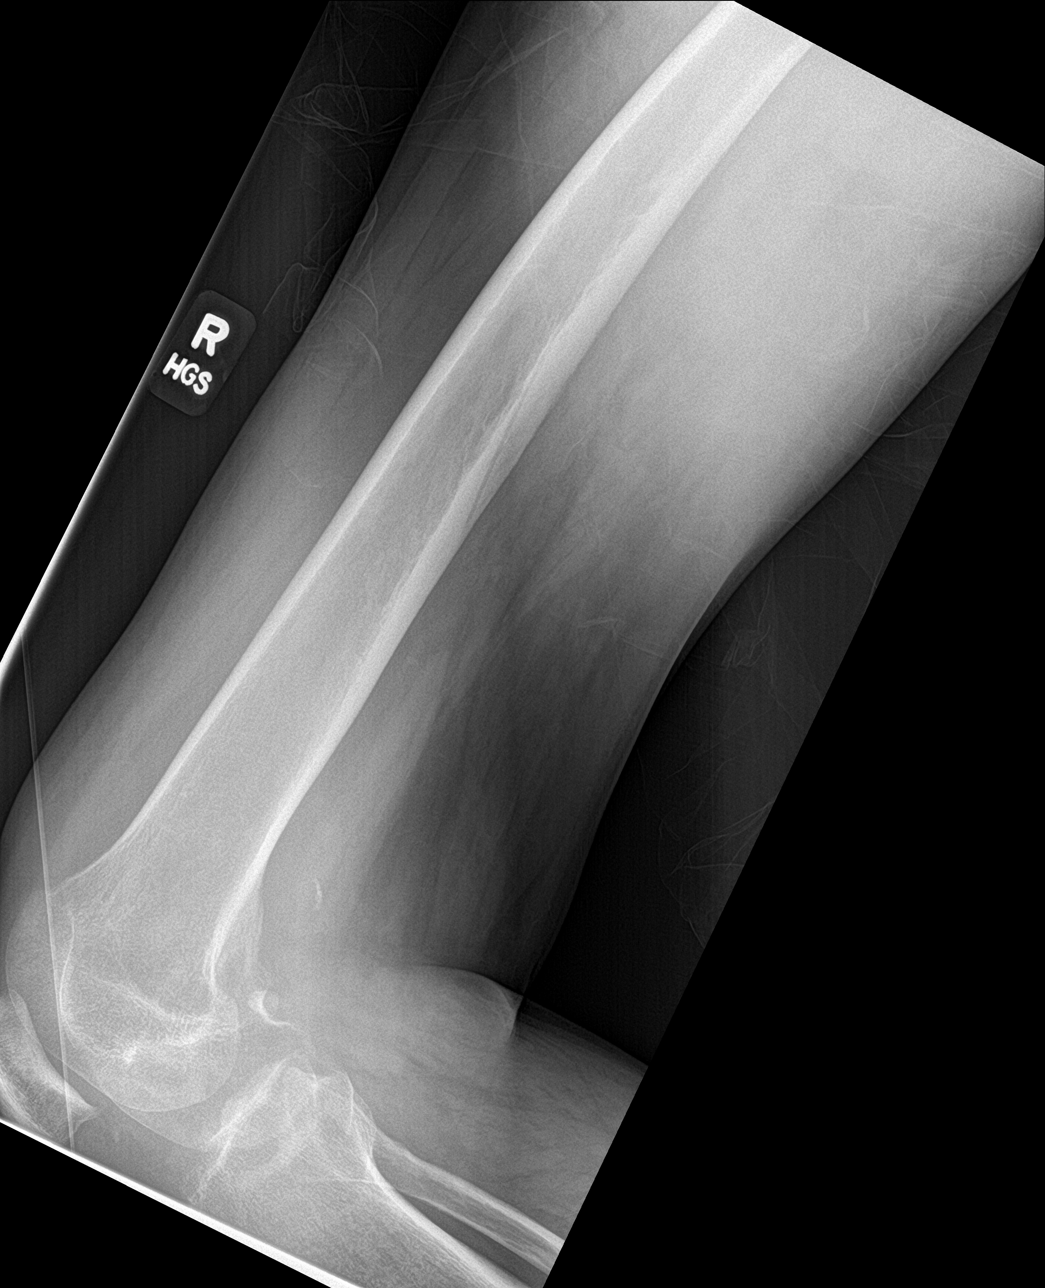

[4 of 4 positions shown; findings below may reference images not displayed]

FINDINGS: There is no evidence of fracture or other focal bone lesions. Soft
tissues are unremarkable.
IMPRESSION: Negative.

## 2018-07-08 IMAGING — CR DG CHEST 1V PORT
1 series · 1 of 1 positions shown · non-contrast
Comparison: None.

CLINICAL DATA: Altered mental status

EXAM:
PORTABLE CHEST 1 VIEW

[chest ap]
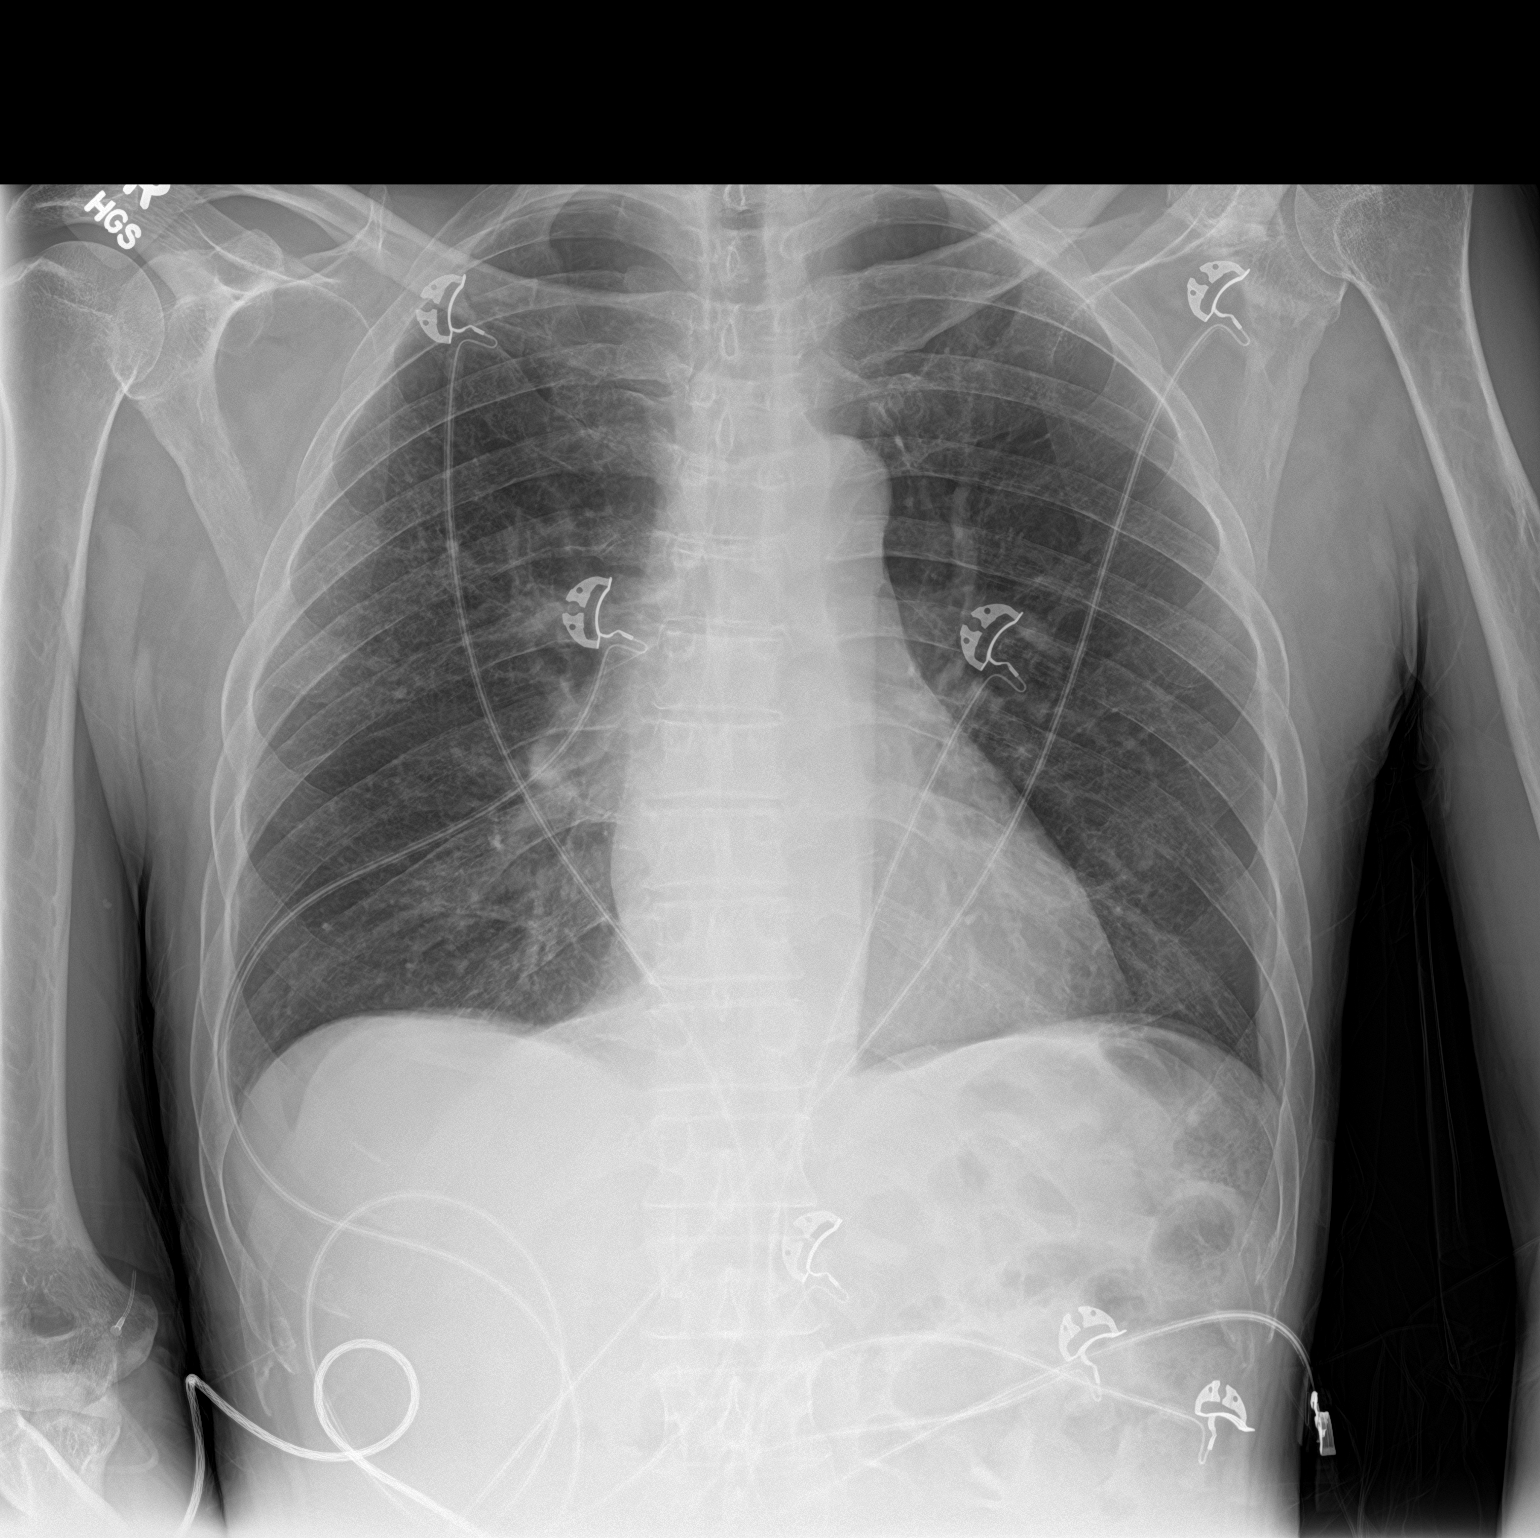

[1 of 1 positions shown; findings below may reference images not displayed]

FINDINGS: The heart size and mediastinal contours are within normal limits.
Both lungs are clear. The visualized skeletal structures are
unremarkable.
IMPRESSION: No active disease.

## 2018-07-08 MED ORDER — SODIUM CHLORIDE 0.9 % IV SOLN
1.0000 g | Freq: Once | INTRAVENOUS | Status: AC
  Administered 2018-07-08: 1 g via INTRAVENOUS
  Filled 2018-07-08: qty 10

## 2018-07-08 MED ORDER — LORAZEPAM 2 MG/ML IJ SOLN: 0.0000 mg | Freq: Four times a day (QID) | INTRAMUSCULAR | Status: AC

## 2018-07-08 MED ORDER — VITAMIN B-1 100 MG PO TABS
100.0000 mg | ORAL_TABLET | Freq: Every day | ORAL | Status: DC
  Administered 2018-07-09 – 2018-07-12 (×4): 100 mg via ORAL
  Filled 2018-07-08 (×4): qty 1

## 2018-07-08 MED ORDER — THIAMINE HCL 100 MG/ML IJ SOLN
100.0000 mg | Freq: Every day | INTRAMUSCULAR | Status: DC
  Filled 2018-07-08: qty 2

## 2018-07-08 MED ORDER — OFLOXACIN 0.3 % OP SOLN
2.0000 [drp] | Freq: Four times a day (QID) | OPHTHALMIC | Status: DC
  Administered 2018-07-09 – 2018-07-12 (×15): 2 [drp] via OPHTHALMIC
  Filled 2018-07-08: qty 5

## 2018-07-08 MED ORDER — ONDANSETRON HCL 4 MG PO TABS: 4.0000 mg | ORAL_TABLET | Freq: Four times a day (QID) | ORAL | Status: DC | PRN

## 2018-07-08 MED ORDER — FOLIC ACID 1 MG PO TABS
1.0000 mg | ORAL_TABLET | Freq: Every day | ORAL | Status: DC
  Administered 2018-07-09 – 2018-07-12 (×4): 1 mg via ORAL
  Filled 2018-07-08 (×4): qty 1

## 2018-07-08 MED ORDER — LORAZEPAM 1 MG PO TABS: 1.0000 mg | ORAL_TABLET | Freq: Four times a day (QID) | ORAL | Status: AC | PRN

## 2018-07-08 MED ORDER — ONDANSETRON HCL 4 MG/2ML IJ SOLN: 4.0000 mg | Freq: Four times a day (QID) | INTRAMUSCULAR | Status: DC | PRN

## 2018-07-08 MED ORDER — PHENYTOIN SODIUM EXTENDED 100 MG PO CAPS
300.0000 mg | ORAL_CAPSULE | Freq: Every day | ORAL | Status: DC
  Administered 2018-07-09 (×2): 300 mg via ORAL
  Filled 2018-07-08 (×3): qty 3

## 2018-07-08 MED ORDER — POTASSIUM CHLORIDE CRYS ER 20 MEQ PO TBCR
40.0000 meq | EXTENDED_RELEASE_TABLET | Freq: Once | ORAL | Status: AC
  Administered 2018-07-08: 40 meq via ORAL
  Filled 2018-07-08: qty 2

## 2018-07-08 MED ORDER — IPRATROPIUM-ALBUTEROL 0.5-2.5 (3) MG/3ML IN SOLN: 3.0000 mL | RESPIRATORY_TRACT | Status: DC | PRN

## 2018-07-08 MED ORDER — GUAIFENESIN-DM 100-10 MG/5ML PO SYRP: 5.0000 mL | ORAL_SOLUTION | ORAL | Status: DC | PRN

## 2018-07-08 MED ORDER — IBUPROFEN 400 MG PO TABS
400.0000 mg | ORAL_TABLET | Freq: Four times a day (QID) | ORAL | Status: DC | PRN
  Administered 2018-07-09: 400 mg via ORAL
  Filled 2018-07-08: qty 1

## 2018-07-08 MED ORDER — SODIUM CHLORIDE 0.9 % IV BOLUS
1000.0000 mL | Freq: Once | INTRAVENOUS | Status: AC
  Administered 2018-07-08: 1000 mL via INTRAVENOUS

## 2018-07-08 MED ORDER — LORAZEPAM 2 MG/ML IJ SOLN: 1.0000 mg | Freq: Four times a day (QID) | INTRAMUSCULAR | Status: AC | PRN

## 2018-07-08 MED ORDER — ADULT MULTIVITAMIN W/MINERALS CH
1.0000 | ORAL_TABLET | Freq: Every day | ORAL | Status: DC
  Administered 2018-07-09 – 2018-07-12 (×4): 1 via ORAL
  Filled 2018-07-08 (×4): qty 1

## 2018-07-08 MED ORDER — DEXTROSE 50 % IV SOLN
25.0000 mL | Freq: Once | INTRAVENOUS | Status: AC
  Administered 2018-07-08: 25 mL via INTRAVENOUS
  Filled 2018-07-08: qty 50

## 2018-07-08 MED ORDER — LEVETIRACETAM 500 MG PO TABS
500.0000 mg | ORAL_TABLET | Freq: Two times a day (BID) | ORAL | Status: DC
  Administered 2018-07-09 – 2018-07-12 (×8): 500 mg via ORAL
  Filled 2018-07-08 (×9): qty 1

## 2018-07-08 MED ORDER — LORAZEPAM 2 MG/ML IJ SOLN: 0.0000 mg | Freq: Two times a day (BID) | INTRAMUSCULAR | Status: DC

## 2018-07-08 MED ORDER — SODIUM CHLORIDE 0.9 % IV SOLN
1.0000 g | INTRAVENOUS | Status: DC
  Administered 2018-07-09: 1 g via INTRAVENOUS
  Filled 2018-07-08: qty 10
  Filled 2018-07-08: qty 1

## 2018-07-08 NOTE — ED Notes (Signed)
ED TO INPATIENT HANDOFF REPORT  Name/Age/Gender Trevor Lambert 58 y.o. male  Code Status   Home/SNF/Other Home  Chief Complaint R Side Back Leg Pain  Level of Care/Admitting Diagnosis ED Disposition    ED Disposition Condition Comment   Admit  Hospital [facility]: [facility] [[id number]]  Level of Care: Med-Surg [16]  Diagnosis: Sepsis [facility](HCC) [[id number]]) [[id number]]  Admitting Physician: Oralia ManisWILLIS, DAVID [[id number]]  Attending Physician: Oralia ManisWILLIS, DAVID [phone][[id number]]  Estimated length of stay: past midnight tomorrow  Certification:: I certify this patient will need inpatient services for at least 2 midnights  PT Class (Do Not Modify): Inpatient [101]  PT Acc Code (Do Not Modify): Private [1]       Medical History Past Medical History:  Diagnosis Date  . Seizures (HCC)     Allergies No Known Allergies  IV Location/Drains/Wounds Patient Lines/Drains/Airways Status   Active Line/Drains/Airways    Name:   Placement date:   Placement time:   Site:   Days:   Peripheral IV 07/08/18 Right Antecubital   07/08/18    1919    Antecubital   less than 1          Labs/Imaging Results for orders placed or performed during the hospital encounter of 07/08/18 (from the past 48 hour(s))  Lipase, blood     Status: None   Collection Time: 07/08/18  7:14 PM  Result Value Ref Range   Lipase 16 11 - 51 U/L    Comment: Performed at [facility], [address] [place], [place] [address]  Comprehensive metabolic panel     Status: Abnormal   Collection Time: 07/08/18  7:14 PM  Result Value Ref Range   Sodium 135 135 - 145 mmol/L   Potassium 3.1 (L) 3.5 - 5.1 mmol/L   Chloride 98 98 - 111 mmol/L   CO2 26 22 - 32 mmol/L   Glucose, Bld 68 (L) 70 - 99 mg/dL   BUN 6 6 - 20 mg/dL   Creatinine, Ser 3.081.02 0.61 - 1.24 mg/dL   Calcium 8.0 (L) 8.9 - 10.3 mg/dL   Total Protein 5.9 (L) 6.5 - 8.1 g/dL   Albumin 2.6 (L) 3.5 - 5.0 g/dL   AST 40 15 - 41 U/L   ALT 22 0 - 44 U/L   Alkaline  Phosphatase 104 38 - 126 U/L   Total Bilirubin 0.8 0.3 - 1.2 mg/dL   GFR calc non Af Amer >60 >60 mL/min   GFR calc Af Amer >60 >60 mL/min   Anion gap 11 5 - 15    Comment: Performed at [facility], [address] [place], [place] [address]  CBC     Status: Abnormal   Collection Time: 07/08/18  7:14 PM  Result Value Ref Range   WBC 16.0 (H) 4.0 - 10.5 K/uL   RBC 3.48 (L) 4.22 - 5.81 MIL/uL   Hemoglobin 10.0 (L) 13.0 - 17.0 g/dL   HCT 69.631.0 (L) 29.539.0 - 28.452.0 %   MCV 89.1 80.0 - 100.0 fL   MCH 28.7 26.0 - 34.0 pg   MCHC 32.3 30.0 - 36.0 g/dL   RDW 13.214.2 44.011.5 - 10.215.5 %   Platelets 81 (L) 150 - 400 K/uL    Comment: Immature Platelet Fraction may be clinically indicated, consider ordering this additional test [id number]    nRBC 0.0 0.0 - 0.2 %    Comment: Performed at [facility], [address] [place], [place] [address]  Urinalysis, Complete w Microscopic  Status: Abnormal   Collection Time: 07/08/18  7:14 PM  Result Value Ref Range   Color, Urine YELLOW (A) YELLOW   APPearance HAZY (A) CLEAR   Specific Gravity, Urine 1.010 1.005 - 1.030   pH 5.0 5.0 - 8.0   Glucose, UA 50 (A) NEGATIVE mg/dL   Hgb urine dipstick MODERATE (A) NEGATIVE   Bilirubin Urine NEGATIVE NEGATIVE   Ketones, ur 20 (A) NEGATIVE mg/dL   Protein, ur NEGATIVE NEGATIVE mg/dL   Nitrite NEGATIVE NEGATIVE   Leukocytes,Ua MODERATE (A) NEGATIVE   RBC / HPF 0-5 0 - 5 RBC/hpf   WBC, UA 6-10 0 - 5 WBC/hpf   Bacteria, UA RARE (A) NONE SEEN   Squamous Epithelial / LPF 0-5 0 - 5   Mucus PRESENT    Hyaline Casts, UA PRESENT     Comment: Performed at [facility], [address] [place], [place] [address]  Troponin I - ONCE - STAT     Status: None   Collection Time: 07/08/18  7:14 PM  Result Value Ref Range   Troponin I <0.03 <0.03 ng/mL    Comment: Performed at [facility], [address] [place], [place] [address]  Ethanol     Status: None   Collection Time: 07/08/18   7:14 PM  Result Value Ref Range   Alcohol, Ethyl (B) <10 <10 mg/dL    Comment: (NOTE) Lowest detectable limit for serum alcohol is 10 mg/dL. For medical purposes only. Performed at [facility], [address], [place], [place] [address]   Urine Drug Screen, Qualitative [facility] only)     Status: None   Collection Time: 07/08/18  7:14 PM  Result Value Ref Range   Tricyclic, Ur Screen NONE DETECTED NONE DETECTED   Amphetamines, Ur Screen NONE DETECTED NONE DETECTED   MDMA (Ecstasy)Ur Screen NONE DETECTED NONE DETECTED   Cocaine Metabolite,Ur [place] NONE DETECTED NONE DETECTED   Opiate, Ur Screen NONE DETECTED NONE DETECTED   Phencyclidine (PCP) Ur S NONE DETECTED NONE DETECTED   Cannabinoid 50 Ng, Ur [place] NONE DETECTED NONE DETECTED   Barbiturates, Ur Screen NONE DETECTED NONE DETECTED   Benzodiazepine, Ur Scrn NONE DETECTED NONE DETECTED   Methadone Scn, Ur NONE DETECTED NONE DETECTED    Comment: (NOTE) Tricyclics + metabolites, urine    Cutoff 1000 ng/mL Amphetamines + metabolites, urine  Cutoff 1000 ng/mL MDMA (Ecstasy), urine              Cutoff 500 ng/mL Cocaine Metabolite, urine          Cutoff 300 ng/mL Opiate + metabolites, urine        Cutoff 300 ng/mL Phencyclidine (PCP), urine         Cutoff 25 ng/mL Cannabinoid, urine                 Cutoff 50 ng/mL Barbiturates + metabolites, urine  Cutoff 200 ng/mL Benzodiazepine, urine              Cutoff 200 ng/mL Methadone, urine                   Cutoff 300 ng/mL The urine drug screen provides only a preliminary, unconfirmed analytical test result and should not be used for non-medical purposes. Clinical consideration and professional judgment should be applied to any positive drug screen result due to possible interfering substances. A more specific alternate chemical method must be used in order to obtain a confirmed analytical result. Gas chromatography /  mass spectrometry (GC/MS) is the preferred confirmat ory  method. Performed at [facility], [address] [place], [place] [address]   CK     Status: Abnormal   Collection Time: 07/08/18  7:14 PM  Result Value Ref Range   Total CK 616 (H) 49 - 397 U/L    Comment: Performed at [facility], [address] [place], [place] [address]  Blood gas, venous ([facility], [facility], [facility])     Status: Abnormal   Collection Time: 07/08/18  7:26 PM  Result Value Ref Range   pH, Ven 7.37 7.250 - 7.430   pCO2, Ven 38 (L) 44.0 - 60.0 mmHg   pO2, Ven <31.0 (LL) 32.0 - 45.0 mmHg   Bicarbonate 22.0 20.0 - 28.0 mmol/L   Acid-base deficit 2.9 (H) 0.0 - 2.0 mmol/L   O2 Saturation 16.0 %   Patient temperature 37.0    Collection site VENOUS    Sample type VENOUS     Comment: Performed at [facility], [address] [place], [place] [address]  Glucose, capillary     Status: Abnormal   Collection Time: 07/08/18  7:26 PM  Result Value Ref Range   Glucose-Capillary 57 (L) 70 - 99 mg/dL  Lactic acid, plasma     Status: None   Collection Time: 07/08/18 10:01 PM  Result Value Ref Range   Lactic Acid, Venous 0.7 0.5 - 1.9 mmol/L    Comment: Performed at [facility], [address], [place], [place] [address]  Influenza panel by PCR (type A & B)     Status: None   Collection Time: 07/08/18 10:01 PM  Result Value Ref Range   Influenza A By PCR NEGATIVE NEGATIVE   Influenza B By PCR NEGATIVE NEGATIVE    Comment: (NOTE) The Xpert Xpress Flu assay is intended as an aid in the diagnosis of  influenza and should not be used as a sole basis for treatment.  This  assay is FDA approved for nasopharyngeal swab specimens only. Nasal  washings and aspirates are unacceptable for Xpert Xpress Flu testing. Performed at [facility], [address] [place], [place] [address]    Ct Head Wo Contrast  Result Date: 07/08/2018 CLINICAL DATA:  Unwitnessed seizure on Sunday. Patient drinks alcohol daily. EXAM: CT HEAD WITHOUT CONTRAST CT  CERVICAL SPINE WITHOUT CONTRAST TECHNIQUE: Multidetector CT imaging of the head and cervical spine was performed following the standard protocol without intravenous contrast. Multiplanar CT image reconstructions of the cervical spine were also generated. COMPARISON:  None. FINDINGS: CT HEAD FINDINGS Brain: Involutional changes the brain slightly advanced for age with chronic appearing microvascular ischemic disease. No large vascular territory infarct, hemorrhage, midline shift or edema. No intra-axial mass nor extra-axial fluid. No hydrocephalus. Midline fourth ventricle basal cisterns without effacement. Vascular: Mild atherosclerosis of the carotid siphons. No hyperdense vessels. Skull: No skull fracture. Sinuses/Orbits: Intact orbits and globes. Ethmoid sinus mucosal thickening is noted on right. Other: Small left frontal scalp contusion with swelling. CT CERVICAL SPINE FINDINGS Alignment: Reversal cervical lordosis. Skull base and vertebrae: No acute fracture. No primary bone lesion or focal pathologic process. Soft tissues and spinal canal: No prevertebral fluid or swelling. No visible canal hematoma. Disc levels: No significant central foraminal stenosis. No jumped or perched facets. 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 Upper chest: Clear Other: None IMPRESSION: 1. Small left frontal scalp contusion with swelling. No underlying skull fracture. 2. 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 No acute intracranial abnormality. 3. No acute posttraumatic cervical spine fracture or subluxation. Electronically Signed   By: Tollie Eth M.D.   On: 07/08/2018 20:39   Ct Cervical Spine Wo Contrast  Result Date: 07/08/2018 CLINICAL DATA:  Unwitnessed seizure on Sunday. Patient drinks alcohol daily. EXAM: CT HEAD WITHOUT CONTRAST CT CERVICAL SPINE WITHOUT CONTRAST TECHNIQUE: Multidetector CT imaging of the head and cervical  spine was performed following the standard protocol without intravenous contrast. Multiplanar CT image reconstructions of the cervical spine were also generated. COMPARISON:  None. FINDINGS: CT HEAD FINDINGS Brain: Involutional changes the brain slightly advanced for age with chronic appearing microvascular ischemic disease. No large vascular territory infarct, hemorrhage, midline shift or edema. No intra-axial mass nor extra-axial fluid. No hydrocephalus. Midline fourth ventricle basal cisterns without effacement. Vascular: Mild atherosclerosis of the carotid siphons. No hyperdense vessels. Skull: No skull fracture. Sinuses/Orbits: Intact orbits and globes. Ethmoid sinus mucosal thickening is noted on right. Other: Small left frontal scalp contusion with swelling. CT CERVICAL SPINE FINDINGS Alignment: Reversal cervical lordosis. Skull base and vertebrae: No acute fracture. No primary bone lesion or focal pathologic process. Soft tissues and spinal canal: No prevertebral fluid or swelling. No visible canal hematoma. Disc levels: No significant central foraminal stenosis. No jumped or perched facets. 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 Upper chest: Clear Other: None IMPRESSION: 1. Small left frontal scalp contusion with swelling. No underlying skull fracture. 2. Chronic appearing minimal small vessel ischemic disease of periventricular white matter. No acute intracranial abnormality. 3. No acute posttraumatic cervical spine fracture or subluxation. Electronically Signed   By: David  Kwon M.D.   On: 07/08/2018 20:39   Dg Chest Port 1 View  Result Date: 07/08/2018 CLINICAL DATA:  Altered mental status EXAM: PORTABLE CHEST 1 VIEW COMPARISON:  None. FINDINGS: The heart size and mediastinal contours are within normal limits. Both lungs are clear. The visualized skeletal structures are unremarkable. IMPRESSION: No active disease. Electronically Signed   By: Kim  Fujinaga  M.D.   On: 07/08/2018 20:30   Dg Femur Min 2 Views Right  Result Date: 07/08/2018 CLINICAL DATA:  Fall with leg pain EXAM: RIGHT FEMUR 2 VIEWS COMPARISON:  None. FINDINGS: There is no evidence of fracture or other focal bone lesions. Soft tissues are unremarkable. IMPRESSION: Negative. Electronically Signed   By: Kim  Fujinaga M.D.   On: 07/08/2018 20:31    Pending Labs Unresulted Labs (From admission, onward)    Start     Ordered   07/08/18 2248  Phenytoin level, free and total  Add-on,   AD     07/08/18 2247   07/08/18 1926  Lactic acid, plasma  Now then every 2 hours,   STAT     07/08/18 1926   07/08/18 1926  Urine culture  ONCE - STAT,   STAT     02 /21/20 1926   Signed and Held  HIV antibody (Routine Testing)  Once,   R     Signed and Held   Signed and Held  Comprehensive metabolic panel  Tomorrow morning,   R     Signed and Held   Signed and Held  CBC  Tomorrow morning,   R     Signed and Held          Vitals/Pain Today's Vitals   07/08/18 2215 07/08/18 2230 07/08/18 2245 07/08/18 2300  BP:      Pulse:  Resp: (!) 22 (!) 22 (!) 24 (!) 28  Temp:      TempSrc:      SpO2:        Isolation Precautions Droplet precaution  Medications Medications  sodium chloride 0.9 % bolus 1,000 mL (0 mLs Intravenous Stopped 07/08/18 2205)  sodium chloride 0.9 % bolus 1,000 mL (1,000 mLs Intravenous New Bag/Given 07/08/18 2108)  dextrose 50 % solution 25 mL (25 mLs Intravenous Given 07/08/18 2034)  cefTRIAXone (ROCEPHIN) 1 g in sodium chloride 0.9 % 100 mL IVPB (1 g Intravenous New Bag/Given 07/08/18 2119)  potassium chloride SA (K-DUR,KLOR-CON) CR tablet 40 mEq (40 mEq Oral Given 07/08/18 2119)    Mobility walks

## 2018-07-08 NOTE — ED Triage Notes (Signed)
Pt reports he had an unwitnessed seizure on Sunday, hit head on wooden TV stand. Pt has not taken Keppra x2 weeks. Pts family member sts pt has not been eating nor drink and has had increased weakness. Pt drinks alcohol daily.

## 2018-07-08 NOTE — ED Triage Notes (Signed)
First Nurse Note:  C/O right side pain, worsening since Monday.  Patient's daughter states that patient has not had anything to eat or drink today and has not taken any of his medications, to include seizure medication.   Patient is AAOx3.  Skin warm and dry. NAD

## 2018-07-08 NOTE — H&P (Signed)
[facility] - [place] at [facility]   PATIENT NAME: Trevor Lambert    MR#:  [id number]  DATE OF BIRTH:  05-May-1961  DATE OF ADMISSION:  07/08/2018  PRIMARY CARE PHYSICIAN: Trevor Lambert   REQUESTING/REFERRING PHYSICIAN: Sharma Covert, Lambert  CHIEF COMPLAINT:   Chief Complaint  Patient presents with  . Abdominal Pain  . Seizures  . Fall    HISTORY OF PRESENT ILLNESS:  Trevor Lambert  is a 58 y.o. male who presents with chief complaint as above.  Patient presents to the ED brought by family after having falls at home.  He has a healing laceration on his face from where he fell.  Evaluation here in the ED shows UA mildly suspicious for UTI, the patient denies any symptoms of UTI.  He does have a history of seizure disorder since childhood, and recently states he has not been taking all of his seizure medications.  He also has a significant alcohol use history, but over the last couple of days has not been drinking as much.  Of note, patient also has significant purulent drainage from both eyes suspicious for bacterial conjunctivitis.  Hospitalist were called for admission and further evaluation  PAST MEDICAL HISTORY:   Past Medical History:  Diagnosis Date  . Seizures (HCC)      PAST SURGICAL HISTORY:   Past Surgical History:  Procedure Laterality Date  . NO PAST SURGERIES       SOCIAL HISTORY:   Social History   Tobacco Use  . Smoking status: Current Every Day Smoker  . Smokeless tobacco: Never Used  Substance Use Topics  . Alcohol use: Yes     FAMILY HISTORY:    Family history reviewed and is non-contributory DRUG ALLERGIES:  No Known Allergies  MEDICATIONS AT HOME:   Prior to Admission medications   Medication Sig Start Date End Date Taking? Authorizing Provider  DILANTIN 100 MG ER capsule Take 300 mg by mouth at bedtime. 06/29/18  Yes Provider, Historical, Lambert  levETIRAcetam (KEPPRA) 500 MG tablet Take 500 mg by mouth. 07/08/18  Yes  Provider, Historical, Lambert  simvastatin (ZOCOR) 20 MG tablet Take 20 mg by mouth daily. 07/03/18   Provider, Historical, Lambert    REVIEW OF SYSTEMS:  Review of Systems  Constitutional: Negative for chills, fever, malaise/fatigue and weight loss.  HENT: Negative for ear pain, hearing loss and tinnitus.   Eyes: Negative for blurred vision, double vision, pain and redness.  Respiratory: Negative for cough, hemoptysis and shortness of breath.   Cardiovascular: Negative for chest pain, palpitations, orthopnea and leg swelling.  Gastrointestinal: Negative for abdominal pain, constipation, diarrhea, nausea and vomiting.  Genitourinary: Negative for dysuria, frequency and hematuria.  Musculoskeletal: Positive for falls. Negative for back pain, joint pain and neck pain.  Skin:       No acne, rash, or lesions  Neurological: Positive for loss of consciousness. Negative for dizziness, tremors, focal weakness and weakness.  Endo/Heme/Allergies: Negative for polydipsia. Does not bruise/bleed easily.  Psychiatric/Behavioral: Negative for depression. The patient is not nervous/anxious and does not have insomnia.      VITAL SIGNS:   Vitals:   07/08/18 1945 07/08/18 2045 07/08/18 2100 07/08/18 2115  BP: (!) 81/53 (!) 89/64 101/90 92/65  Pulse: 100 98 (!) 103 93  Resp: (!) 31 17 (!) 27 17  Temp:      TempSrc:      SpO2: 100% 100% 100% 99%   Wt Readings from Last 3 Encounters:  No data found for Wt    PHYSICAL EXAMINATION:  Physical Exam  Vitals reviewed. Constitutional: He is oriented to person, place, and time. He appears well-developed and well-nourished. No distress.  HENT:  Head: Normocephalic and atraumatic.  Mouth/Throat: Oropharynx is clear and moist.  Eyes: Pupils are equal, round, and reactive to light. EOM are normal. No scleral icterus.  Bilateral purulent discharge  Neck: Normal range of motion. Neck supple. No JVD present. No thyromegaly present.  Cardiovascular: Normal rate,  regular rhythm and intact distal pulses. Exam reveals no gallop and no friction rub.  No murmur heard. Respiratory: Effort normal and breath sounds normal. No respiratory distress. He has no wheezes. He has no rales.  GI: Soft. Bowel sounds are normal. He exhibits no distension. There is no abdominal tenderness.  Musculoskeletal: Normal range of motion.        General: No edema.     Comments: No arthritis, no gout  Lymphadenopathy:    He has no cervical adenopathy.  Neurological: He is alert and oriented to person, place, and time. No cranial nerve deficit.  No dysarthria, no aphasia  Skin: Skin is warm and dry. No rash noted. No erythema.  Psychiatric: He has a normal mood and affect. His behavior is normal. Judgment and thought content normal.    LABORATORY PANEL:   CBC Recent Labs  Lab 07/08/18 1914  WBC 16.0*  HGB 10.0*  HCT 31.0*  PLT 81*   ------------------------------------------------------------------------------------------------------------------  Chemistries  Recent Labs  Lab 07/08/18 1914  NA 135  K 3.1*  CL 98  CO2 26  GLUCOSE 68*  BUN 6  CREATININE 1.02  CALCIUM 8.0*  AST 40  ALT 22  ALKPHOS 104  BILITOT 0.8   ------------------------------------------------------------------------------------------------------------------  Cardiac Enzymes Recent Labs  Lab 07/08/18 1914  TROPONINI <0.03   ------------------------------------------------------------------------------------------------------------------  RADIOLOGY:  Ct Head Wo Contrast  Result Date: 07/08/2018 CLINICAL DATA:  Unwitnessed seizure on Sunday. Patient drinks alcohol daily. EXAM: CT HEAD WITHOUT CONTRAST CT CERVICAL SPINE WITHOUT CONTRAST TECHNIQUE: Multidetector CT imaging of the head and cervical spine was performed following the standard protocol without intravenous contrast. Multiplanar CT image reconstructions of the cervical spine were also generated. COMPARISON:  None.  FINDINGS: CT HEAD FINDINGS Brain: Involutional changes the brain slightly advanced for age with chronic appearing microvascular ischemic disease. No large vascular territory infarct, hemorrhage, midline shift or edema. No intra-axial mass nor extra-axial fluid. No hydrocephalus. Midline fourth ventricle basal cisterns without effacement. Vascular: Mild atherosclerosis of the carotid siphons. No hyperdense vessels. Skull: No skull fracture. Sinuses/Orbits: Intact orbits and globes. Ethmoid sinus mucosal thickening is noted on right. Other: Small left frontal scalp contusion with swelling. CT CERVICAL SPINE FINDINGS Alignment: Reversal cervical lordosis. Skull base and vertebrae: No acute fracture. No primary bone lesion or focal pathologic process. Soft tissues and spinal canal: No prevertebral fluid or swelling. No visible canal hematoma. Disc levels: No significant central foraminal stenosis. No jumped or perched facets. 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 Upper chest: Clear Other: None IMPRESSION: 1. Small left frontal scalp contusion with swelling. No underlying skull fracture. 2. Chronic appearing minimal small vessel ischemic disease of periventricular white matter. No acute intracranial abnormality. 3. No acute posttraumatic cervical spine fracture or subluxation. Electronically Signed   By: Tollie Eth M.D.   On: 07/08/2018 20:39   Ct Cervical Spine Wo Contrast  Result Date: 07/08/2018 CLINICAL DATA:  Unwitnessed seizure on Sunday. Patient drinks alcohol daily. EXAM:  CT HEAD WITHOUT CONTRAST CT CERVICAL SPINE WITHOUT CONTRAST TECHNIQUE: Multidetector CT imaging of the head and cervical spine was performed following the standard protocol without intravenous contrast. Multiplanar CT image reconstructions of the cervical spine were also generated. COMPARISON:  None. FINDINGS: CT HEAD FINDINGS Brain: Involutional changes the brain slightly advanced for age with  chronic appearing microvascular ischemic disease. No large vascular territory infarct, hemorrhage, midline shift or edema. No intra-axial mass nor extra-axial fluid. No hydrocephalus. Midline fourth ventricle basal cisterns without effacement. Vascular: Mild atherosclerosis of the carotid siphons. No hyperdense vessels. Skull: No skull fracture. Sinuses/Orbits: Intact orbits and globes. Ethmoid sinus mucosal thickening is noted on right. Other: Small left frontal scalp contusion with swelling. CT CERVICAL SPINE FINDINGS Alignment: Reversal cervical lordosis. Skull base and vertebrae: No acute fracture. No primary bone lesion or focal pathologic process. Soft tissues and spinal canal: No prevertebral fluid or swelling. No visible canal hematoma. Disc levels: No significant central foraminal stenosis. No jumped or perched facets. 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 Upper chest: Clear Other: None IMPRESSION: 1. Small left frontal scalp contusion with swelling. No underlying skull fracture. 2. Chronic appearing minimal small vessel ischemic disease of periventricular white matter. No acute intracranial abnormality. 3. No acute posttraumatic cervical spine fracture or subluxation. Electronically Signed   By: Tollie Eth M.D.   On: 07/08/2018 20:39   Dg Chest Port 1 View  Result Date: 07/08/2018 CLINICAL DATA:  Altered mental status EXAM: PORTABLE CHEST 1 VIEW COMPARISON:  None. FINDINGS: The heart size and mediastinal contours are within normal limits. Both lungs are clear. The visualized skeletal structures are unremarkable. IMPRESSION: No active disease. Electronically Signed   By: Jasmine Pang M.D.   On: 07/08/2018 20:30   Dg Femur Min 2 Views Right  Result Date: 07/08/2018 CLINICAL DATA:  Fall with leg pain EXAM: RIGHT FEMUR 2 VIEWS COMPARISON:  None. FINDINGS: There is no evidence of fracture or other focal bone lesions. Soft tissues are unremarkable.  IMPRESSION: Negative. Electronically Signed   By: Jasmine Pang M.D.   On: 07/08/2018 20:31    EKG:   Orders placed or performed during the hospital encounter of 07/08/18  . ED EKG  . ED EKG  . EKG 12-Lead  . EKG 12-Lead    IMPRESSION AND PLAN:  Principal Problem:   Sepsis (HCC) -patient was given IV antibiotics in the ED.  He did technically meet sepsis criteria with tachycardia and leukocytosis, source would be most likely UTI.  However, given his lack of symptoms I am somewhat suspicious of urine infection.  That being said he is on antibiotics for now.  His lactic acid was within normal limits.  His blood pressure was stable.  I have ordered a procalcitonin to help identify if he has a bacterial infection and guide antibiotic therapy.  Cultures sent Active Problems:   Acute encephalopathy -due to his UTI versus his seizures.  Treatment as above and below   UTI (urinary tract infection) -antibiotics as above, culture sent   Alcohol abuse -CIWA protocol, alcohol level was negative in the ED, this could potentially have lowered his seizure threshold   Seizures (HCC) -restart home dose antiepileptics, check phenytoin levels, neurology consult   Conjunctivitis -antibiotic eyedrops ordered  Chart review performed and case discussed with ED provider. Labs, imaging and/or ECG reviewed by provider and discussed with patient/family. Management plans discussed with the patient and/or family.  DVT PROPHYLAXIS: Mechanical only  GI PROPHYLAXIS:  None  ADMISSION STATUS: Inpatient     CODE STATUS: Full  TOTAL TIME TAKING CARE OF THIS PATIENT: 45 minutes.   Barney DrainDavid F Jaionna Weisse 07/08/2018, 10:47 PM  [facility]  Office  [phone]  CC: Primary care physician; Trevor Classechurch, Hanley HaysFrances E, Lambert  Note:  This document was prepared using Dragon voice recognition software and may include unintentional dictation errors.

## 2018-07-08 NOTE — ED Provider Notes (Addendum)
[facility] Emergency Department Provider Note  ____________________________________________  Time seen: Approximately 7:26 PM  I have reviewed the triage vital signs and the nursing notes.   HISTORY  Chief Complaint Abdominal Pain; Seizures; and Fall    HPI Trevor Lambert is a 58 y.o. male with a history of alcohol dependence, seizures, brought by his sister-in-law for altered mental status.  Some of the history is obtained by the patient but he is not completely oriented; the remainder is obtained from his sister-in-law.  The patient reports that he fell on Sunday, resulting in a laceration over the left eyebrow, but cannot give any details of the history of the fall, why he fell, or if he lost consciousness.  Family reports they think he had a seizure, and that he has not taken his Keppra for the past 2 weeks.  He was able to get up and did not seek medical attention.  Since then, it has been noted that the patient is more confused than baseline.  At baseline, his sister-in-law states that he would know the year, the month, but would not know who the president of the [place] is in today he does not know the month.  Patient does drink daily alcohol and reports his last drink was last night.  He reports mid right thigh pain.  Also reports decreased p.o. intake.  Upon arrival to the emergency department, the patient is noted to have a blood pressure of 66/46 with a heart rate of 103 and a temperature of 99.2.  Past Medical History:  Diagnosis Date  . Seizures (HCC)     There are no active problems to display for this patient.       Allergies Patient has no known allergies.  No family history on file.  Social History Social History   Tobacco Use  . Smoking status: Current Every Day Smoker  . Smokeless tobacco: Never Used  Substance Use Topics  . Alcohol use: Not on file  . Drug use: Not on file    Review of Systems Review of systems may be  limited due to patient altered mental status. Constitutional: No fever/chills.  Positive fall with possible seizure. Eyes: No visual changes.  Positive bilateral eye discharge. ENT: No sore throat. No congestion or rhinorrhea. Cardiovascular: Denies chest pain. Denies palpitations. Respiratory: Denies shortness of breath.  No cough. Gastrointestinal: No abdominal pain.  No nausea, no vomiting.  No diarrhea.  No constipation.  Decreased appetite with decreased p.o. intake. Genitourinary: Negative for dysuria. Musculoskeletal: Negative for back pain.  + the right mid thigh pain.  Denies neck pain. Skin: Negative for rash. Neurological: Negative for headaches. No focal numbness, tingling or weakness.     ____________________________________________   PHYSICAL EXAM:  VITAL SIGNS: ED Triage Vitals [07/08/18 1909]  Enc Vitals Group     BP (!) 66/46     Pulse Rate (!) 116     Resp 20     Temp 99.2 F (37.3 C)     Temp Source Oral     SpO2 99 %     Weight      Height      Head Circumference      Peak Flow      Pain Score      Pain Loc      Pain Edu?      Excl. in GC?     Constitutional: Patient is alert to person, place, year but not month.  He does  not know who the president of the [place] is is.  GCS is 15 and the patient is able to follow commands normally. Malodorous urine smell. Eyes: Conjunctivae are normal.  EOMI. PERRLA.  No raccoon eyes.  Positive bilateral eye discharge.  No scleral icterus. Head: 3 cm scabbed laceration vertically in the medial left eyebrow without any surrounding erythema, purulence..  Negative battle sign. Nose: No congestion/rhinnorhea.  No swelling over the nose or septal hematoma. Mouth/Throat: Mucous membranes are very dry.  Neck: No stridor.  Supple.  No JVD.  No meningismus. Cardiovascular: Normal rate, regular rhythm. No murmurs, rubs or gallops.  Respiratory: Normal respiratory effort.  No accessory muscle use or retractions. Lungs CTAB.  No  wheezes, rales or ronchi. Gastrointestinal: Soft, nontender and nondistended.  No guarding or rebound.  No peritoneal signs. Musculoskeletal: No midline C, T or L-spine tenderness to palpation, step-offs or deformities.  The pelvis is stable.  The patient has full range of motion of the bilateral hips, knees and ankles without pain.  Upon palpation, the patient has tenderness over the mid femur without any overlying ecchymosis or other obvious deformity.  No LE edema. No ttp in the calves or palpable cords.  Negative Homan's sign. Neurologic: Alert.  Speech is clear.  Face and smile are symmetric.  EOMI.  Moves all extremities well. Skin:  Skin is warm. No rash noted. Psychiatric: Mood and affect are normal.   ____________________________________________   LABS (all labs ordered are listed, but only abnormal results are displayed)  Labs Reviewed  COMPREHENSIVE METABOLIC PANEL - Abnormal; Notable for the following components:      Result Value   Potassium 3.1 (*)    Glucose, Bld 68 (*)    Calcium 8.0 (*)    Total Protein 5.9 (*)    Albumin 2.6 (*)    All other components within normal limits  CBC - Abnormal; Notable for the following components:   WBC 16.0 (*)    RBC 3.48 (*)    Hemoglobin 10.0 (*)    HCT 31.0 (*)    Platelets 81 (*)    All other components within normal limits  URINALYSIS, COMPLETE (UACMP) WITH MICROSCOPIC - Abnormal; Notable for the following components:   Color, Urine YELLOW (*)    APPearance HAZY (*)    Glucose, UA 50 (*)    Hgb urine dipstick MODERATE (*)    Ketones, ur 20 (*)    Leukocytes,Ua MODERATE (*)    Bacteria, UA RARE (*)    All other components within normal limits  GLUCOSE, CAPILLARY - Abnormal; Notable for the following components:   Glucose-Capillary 57 (*)    All other components within normal limits  URINE CULTURE  LIPASE, BLOOD  TROPONIN I  ETHANOL  LACTIC ACID, PLASMA  LACTIC ACID, PLASMA  BLOOD GAS, VENOUS  INFLUENZA PANEL BY PCR  (TYPE A & B)  URINE DRUG SCREEN, QUALITATIVE ([facility] ONLY)  CK  CBG MONITORING, ED   ____________________________________________  EKG  ED ECG REPORT I, Anne-Caroline Sharma CovertNorman, the attending physician, personally viewed and interpreted this ECG.   Date: 07/08/2018  EKG Time: 1909  Rate: 116  Rhythm: sinus tachycardia  Axis: normal  Intervals:none  ST&T Change: No STEMI; nonspecific T wave inversion in V4 and V5.  ED ECG REPORT I, Anne-Caroline Sharma CovertNorman, the attending physician, personally viewed and interpreted this ECG.   Date: 07/08/2018  EKG Time: 1919  Rate: 103  Rhythm: sinus tachycardia  Axis: normal  Intervals:none  ST&T  Change: No STEMI   ____________________________________________  RADIOLOGY  Ct Head Wo Contrast  Result Date: 07/08/2018 CLINICAL DATA:  Unwitnessed seizure on Sunday. Patient drinks alcohol daily. EXAM: CT HEAD WITHOUT CONTRAST CT CERVICAL SPINE WITHOUT CONTRAST TECHNIQUE: Multidetector CT imaging of the head and cervical spine was performed following the standard protocol without intravenous contrast. Multiplanar CT image reconstructions of the cervical spine were also generated. COMPARISON:  None. FINDINGS: CT HEAD FINDINGS Brain: Involutional changes the brain slightly advanced for age with chronic appearing microvascular ischemic disease. No large vascular territory infarct, hemorrhage, midline shift or edema. No intra-axial mass nor extra-axial fluid. No hydrocephalus. Midline fourth ventricle basal cisterns without effacement. Vascular: Mild atherosclerosis of the carotid siphons. No hyperdense vessels. Skull: No skull fracture. Sinuses/Orbits: Intact orbits and globes. Ethmoid sinus mucosal thickening is noted on right. Other: Small left frontal scalp contusion with swelling. CT CERVICAL SPINE FINDINGS Alignment: Reversal cervical lordosis. Skull base and vertebrae: No acute fracture. No primary bone lesion or focal pathologic process. Soft tissues and  spinal canal: No prevertebral fluid or swelling. No visible canal hematoma. Disc levels: No significant central foraminal stenosis. No jumped or perched facets. 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 Upper chest: Clear Other: None IMPRESSION: 1. Small left frontal scalp contusion with swelling. No underlying skull fracture. 2. Chronic appearing minimal small vessel ischemic disease of periventricular white matter. No acute intracranial abnormality. 3. No acute posttraumatic cervical spine fracture or subluxation. Electronically Signed   By: Tollie Eth M.D.   On: 07/08/2018 20:39   Ct Cervical Spine Wo Contrast  Result Date: 07/08/2018 CLINICAL DATA:  Unwitnessed seizure on Sunday. Patient drinks alcohol daily. EXAM: CT HEAD WITHOUT CONTRAST CT CERVICAL SPINE WITHOUT CONTRAST TECHNIQUE: Multidetector CT imaging of the head and cervical spine was performed following the standard protocol without intravenous contrast. Multiplanar CT image reconstructions of the cervical spine were also generated. COMPARISON:  None. FINDINGS: CT HEAD FINDINGS Brain: Involutional changes the brain slightly advanced for age with chronic appearing microvascular ischemic disease. No large vascular territory infarct, hemorrhage, midline shift or edema. No intra-axial mass nor extra-axial fluid. No hydrocephalus. Midline fourth ventricle basal cisterns without effacement. Vascular: Mild atherosclerosis of the carotid siphons. No hyperdense vessels. Skull: No skull fracture. Sinuses/Orbits: Intact orbits and globes. Ethmoid sinus mucosal thickening is noted on right. Other: Small left frontal scalp contusion with swelling. CT CERVICAL SPINE FINDINGS Alignment: Reversal cervical lordosis. Skull base and vertebrae: No acute fracture. No primary bone lesion or focal pathologic process. Soft tissues and spinal canal: No prevertebral fluid or swelling. No visible canal hematoma. Disc levels: No  significant central foraminal stenosis. No jumped or perched facets. 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 Upper chest: Clear Other: None IMPRESSION: 1. Small left frontal scalp contusion with swelling. No underlying skull fracture. 2. Chronic appearing minimal small vessel ischemic disease of periventricular white matter. No acute intracranial abnormality. 3. No acute posttraumatic cervical spine fracture or subluxation. Electronically Signed   By: Tollie Eth M.D.   On: 07/08/2018 20:39   Dg Chest Port 1 View  Result Date: 07/08/2018 CLINICAL DATA:  Altered mental status EXAM: PORTABLE CHEST 1 VIEW COMPARISON:  None. FINDINGS: The heart size and mediastinal contours are within normal limits. Both lungs are clear. The visualized skeletal structures are unremarkable. IMPRESSION: No active disease. Electronically Signed   By: Jasmine Pang M.D.   On: 07/08/2018 20:30   Dg Femur Min 2  Views Right  Result Date: 07/08/2018 CLINICAL DATA:  Fall with leg pain EXAM: RIGHT FEMUR 2 VIEWS COMPARISON:  None. FINDINGS: There is no evidence of fracture or other focal bone lesions. Soft tissues are unremarkable. IMPRESSION: Negative. Electronically Signed   By: Jasmine Pang M.D.   On: 07/08/2018 20:31    ____________________________________________   PROCEDURES  Procedure(s) performed: None  Procedures  Critical Care performed: Yes, see critical care note(s) ____________________________________________   INITIAL IMPRESSION / ASSESSMENT AND PLAN / ED COURSE  Pertinent labs & imaging results that were available during my care of the patient were reviewed by me and considered in my medical decision making (see chart for details).  58 y.o. male with a history of alcohol dependence and seizure disorder presenting with confusion, found to be hypotensive and tachycardic here.  Overall, I am concerned about multiple possible etiologies.  He will have a traumatic  work-up given the obvious injury to his head with CT of the head and C-spine.  He has a laceration that is greater than 24 hours old and healing well without any evidence of infection.  Given his alcohol history, I am concerned about either alcohol intoxication or withdrawal seizure as a cause for his fall.  We will check electrolytes.  I am concerned about his profound hypotension; it is possible this is due to severe hypovolemia and dehydration given his dry mucous membranes, but we need to evaluate for infection.  At this time, I do not see a source although his urine is malodorous.  I will follow his laboratory studies very closely and initiate antibiotics as soon as we have more information if needed.  The patient will need to be admitted to the hospital.  ----------------------------------------- 8:03 PM on 07/08/2018 -----------------------------------------  The patient is hypoglycemic and half an amp of dextrose has been ordered.  If his CT scan is clear, will do a swallow study to see if he is able to eat to prevent further hypoglycemia.  ----------------------------------------- 8:40 PM on 07/08/2018 -----------------------------------------  Reevaluated the patient, who is feeling better and interacting more at this time.  He has a repeat blood pressure in the 90s, after 800 cc of intravenous fluids.  Chest x-ray does not show infection, but he does have hypoglycemia with a white blood cell count of 16 so I will initiate Rocephin given that this may be related to a UTI.  ----------------------------------------- 9:36 PM on 07/08/2018 -----------------------------------------  Patient's UA is consistent with a UTI and he has received treatment.  At this time, his blood pressure continues to improve with intravenous fluids.  He will be admitted to the hospitalist for continued evaluation and treatment.   CRITICAL CARE Performed by: Rockne Menghini   Total critical care time:  35 minutes  Critical care time was exclusive of separately billable procedures and treating other patients.  Critical care was necessary to treat or prevent imminent or life-threatening deterioration.  Critical care was time spent personally by me on the following activities: development of treatment plan with patient and/or surrogate as well as nursing, discussions with consultants, evaluation of patient's response to treatment, examination of patient, obtaining history from patient or surrogate, ordering and performing treatments and interventions, ordering and review of laboratory studies, ordering and review of radiographic studies, pulse oximetry and re-evaluation of patient's condition.   ____________________________________________  FINAL CLINICAL IMPRESSION(S) / ED DIAGNOSES  Final diagnoses:  Hypoglycemia  Hypotension, unspecified hypotension type  Seizure (HCC)  Facial laceration, initial encounter  Hypokalemia  Acute cystitis without hematuria         NEW MEDICATIONS STARTED DURING THIS VISIT:  New Prescriptions   No medications on file      Rockne Menghini, MD 07/08/18 2004    Rockne Menghini, MD 07/08/18 6720    Rockne Menghini, MD 07/08/18 2137

## 2018-07-09 ENCOUNTER — Inpatient Hospital Stay: Payer: Medicare Other

## 2018-07-09 ENCOUNTER — Other Ambulatory Visit: Payer: Self-pay

## 2018-07-09 ENCOUNTER — Encounter: Payer: Self-pay | Admitting: *Deleted

## 2018-07-09 DIAGNOSIS — R569 Unspecified convulsions: Secondary | ICD-10-CM

## 2018-07-09 LAB — COMPREHENSIVE METABOLIC PANEL
ALT: 21 U/L (ref 0–44)
ANION GAP: 8 (ref 5–15)
AST: 46 U/L — ABNORMAL HIGH (ref 15–41)
Albumin: 2 g/dL — ABNORMAL LOW (ref 3.5–5.0)
Alkaline Phosphatase: 76 U/L (ref 38–126)
BUN: 9 mg/dL (ref 6–20)
CO2: 23 mmol/L (ref 22–32)
Calcium: 7 mg/dL — ABNORMAL LOW (ref 8.9–10.3)
Chloride: 103 mmol/L (ref 98–111)
Creatinine, Ser: 0.96 mg/dL (ref 0.61–1.24)
GFR calc Af Amer: >60 mL/min (ref >60)
GFR calc non Af Amer: >60 mL/min (ref >60)
Glucose, Bld: 66 mg/dL — ABNORMAL LOW (ref 70–99)
Potassium: 3.6 mmol/L (ref 3.5–5.1)
Sodium: 134 mmol/L — ABNORMAL LOW (ref 135–145)
Total Bilirubin: 0.5 mg/dL (ref 0.3–1.2)
Total Protein: 4.9 g/dL — ABNORMAL LOW (ref 6.5–8.1)

## 2018-07-09 LAB — CBC
HCT: 25.2 % — ABNORMAL LOW (ref 39.0–52.0)
Hemoglobin: 8.1 g/dL — ABNORMAL LOW (ref 13.0–17.0)
MCH: 28.6 pg (ref 26.0–34.0)
MCHC: 32.1 g/dL (ref 30.0–36.0)
MCV: 89 fL (ref 80.0–100.0)
PLATELETS: 80 10*3/uL — AB (ref 150–400)
RBC: 2.83 MIL/uL — AB (ref 4.22–5.81)
RDW: 14.6 % (ref 11.5–15.5)
WBC: 9.2 10*3/uL (ref 4.0–10.5)
nRBC: 0 % (ref 0.0–0.2)

## 2018-07-09 LAB — MRSA PCR SCREENING: MRSA by PCR: NEGATIVE

## 2018-07-09 LAB — GLUCOSE, CAPILLARY: Glucose-Capillary: 87 mg/dL (ref 70–99)

## 2018-07-09 LAB — PROCALCITONIN: PROCALCITONIN: 23.09 ng/mL

## 2018-07-09 IMAGING — DX DG CHEST 1V
1 series · 1 of 1 positions shown · non-contrast
Comparison: [DATE]

CLINICAL DATA: Shortness of breath.  Ex-smoker.

EXAM:
CHEST  1 VIEW

[chest ap]
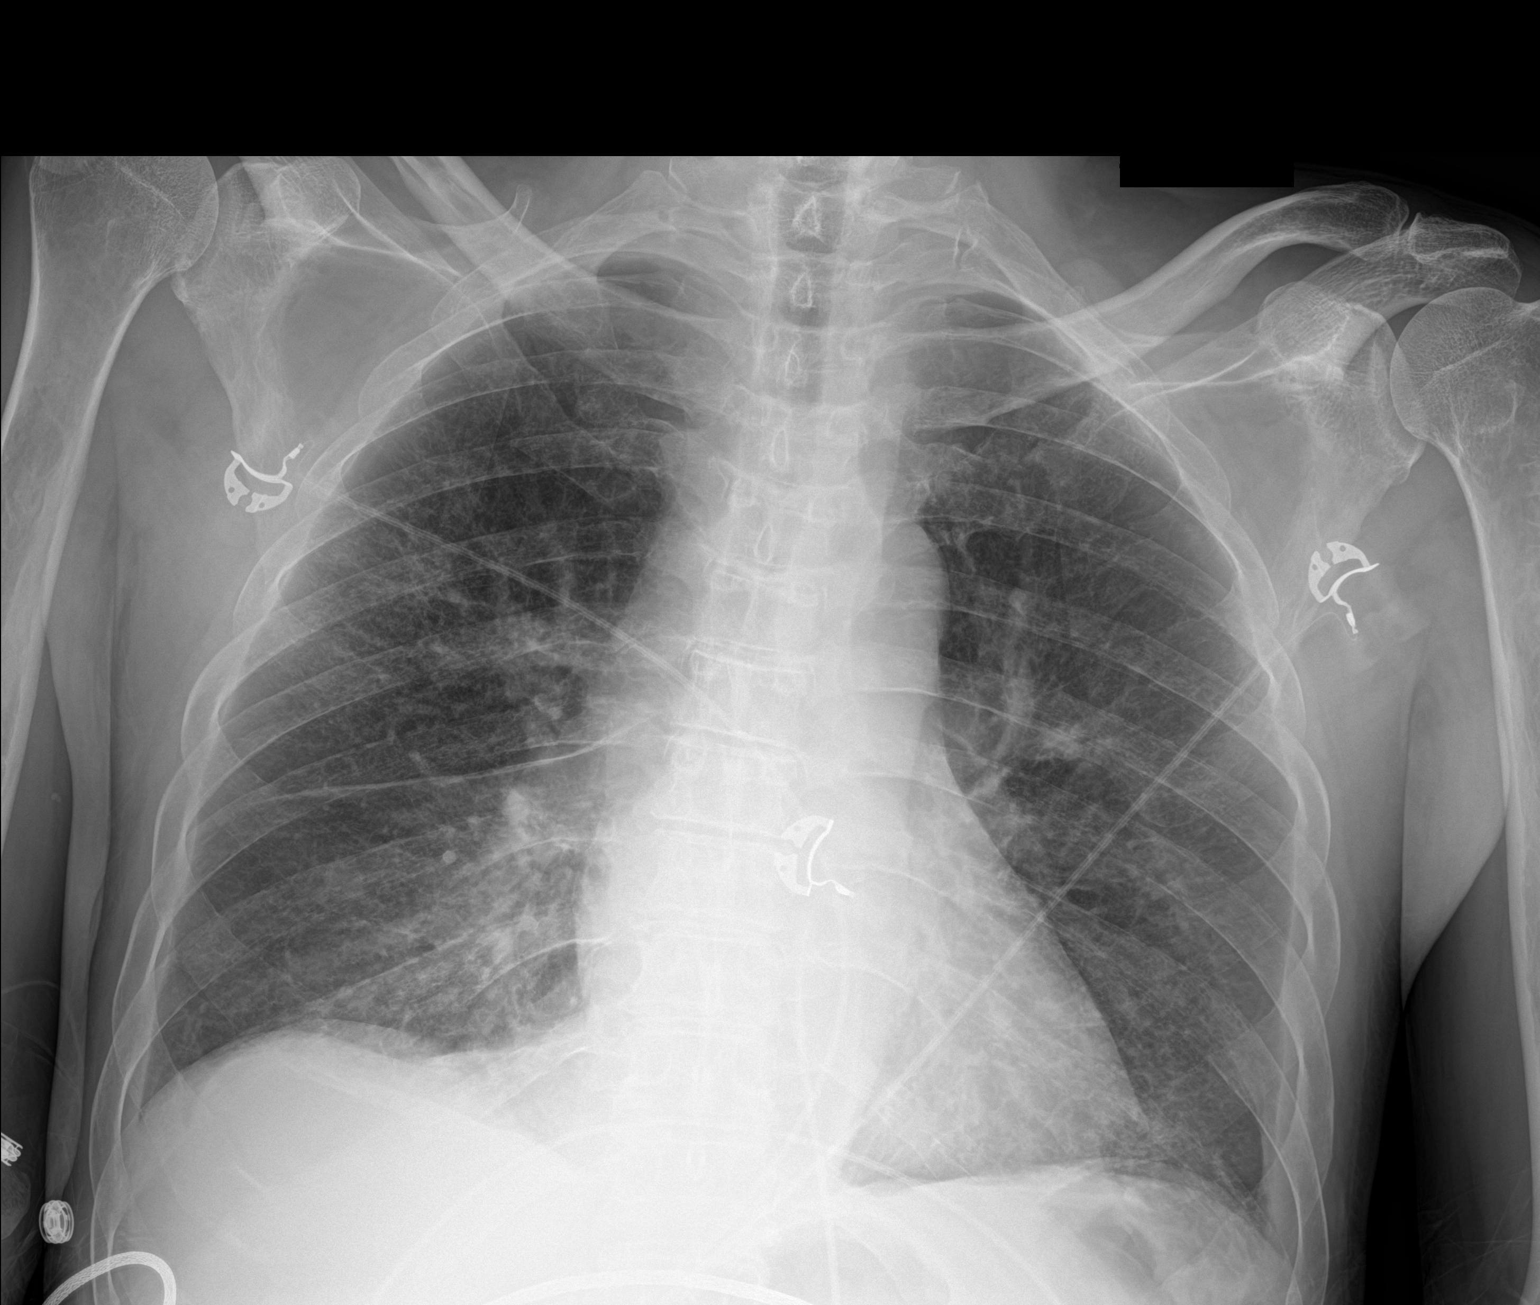

[1 of 1 positions shown; findings below may reference images not displayed]

FINDINGS: Midline trachea. Normal heart size and mediastinal contours. No
pleural effusion or pneumothorax. Developing medial right lower lobe
and possible left retrocardiac airspace disease. There is also
increased right upper lobe opacity just above the major fissure.
IMPRESSION: Developing right and possible left-sided airspace disease. Favor
infection or aspiration. Asymmetric pulmonary edema felt less
likely.

## 2018-07-09 IMAGING — US US ABDOMEN LIMITED
1 series · 14 of 25 positions shown · non-contrast
Comparison: None.

CLINICAL DATA: Evaluate for cirrhosis.

EXAM:
ULTRASOUND ABDOMEN LIMITED RIGHT UPPER QUADRANT

[Series 1: us abdomen limited · 14 of 68 slices shown]
[im 1/68]
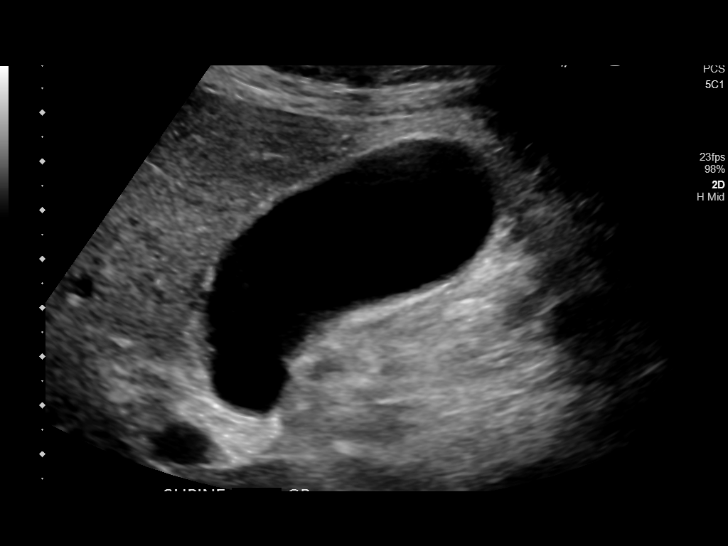
[im 6/68]
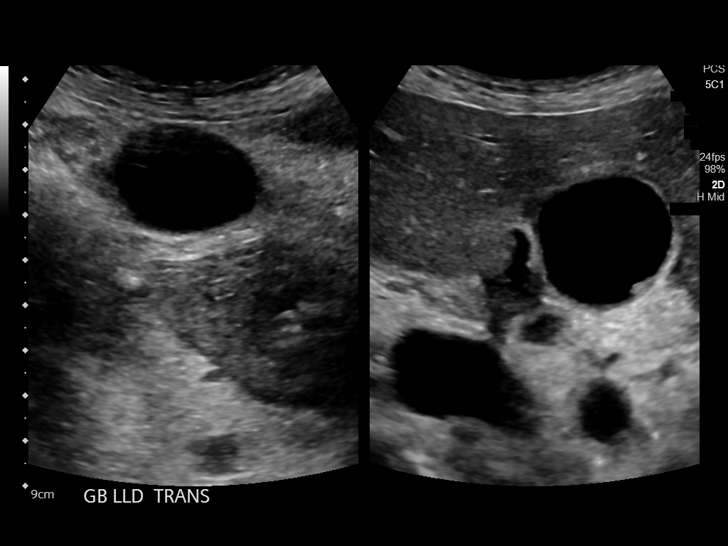
[im 12/68]
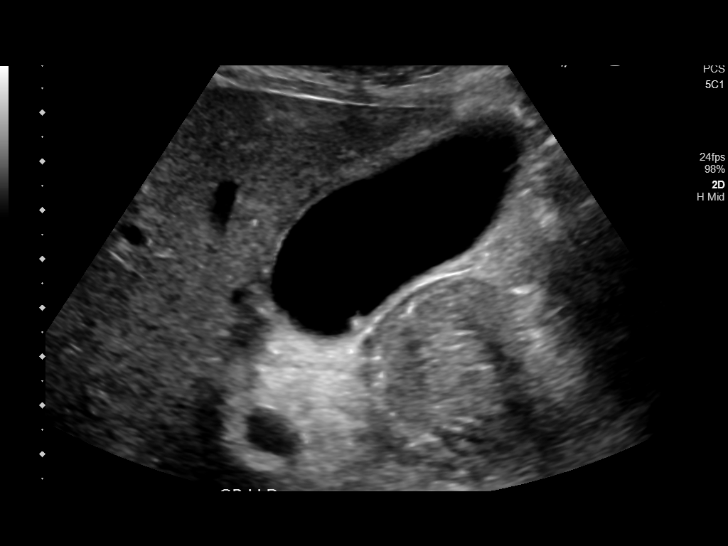
[im 17/68]
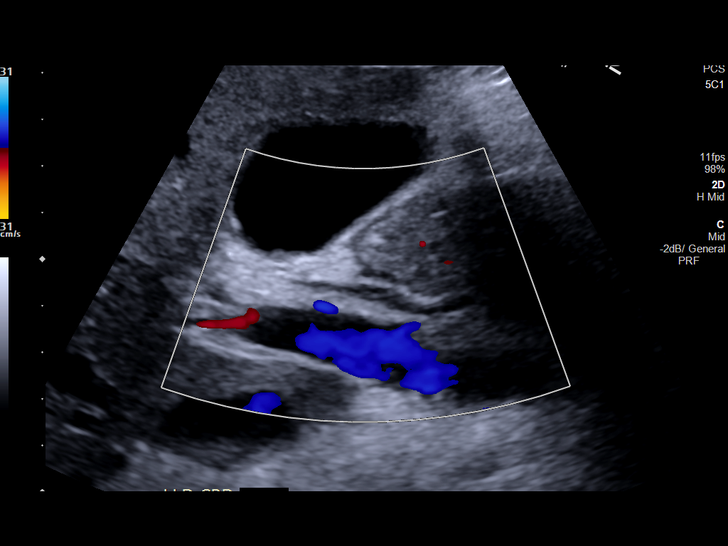
[im 23/68]
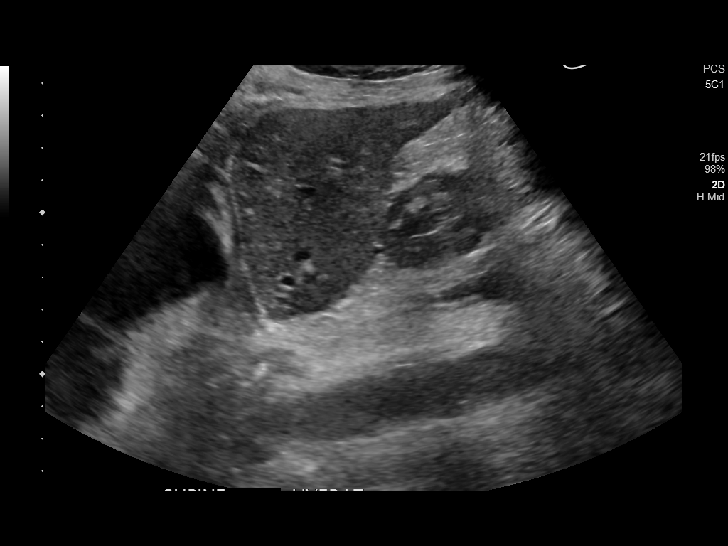
[im 26/68]
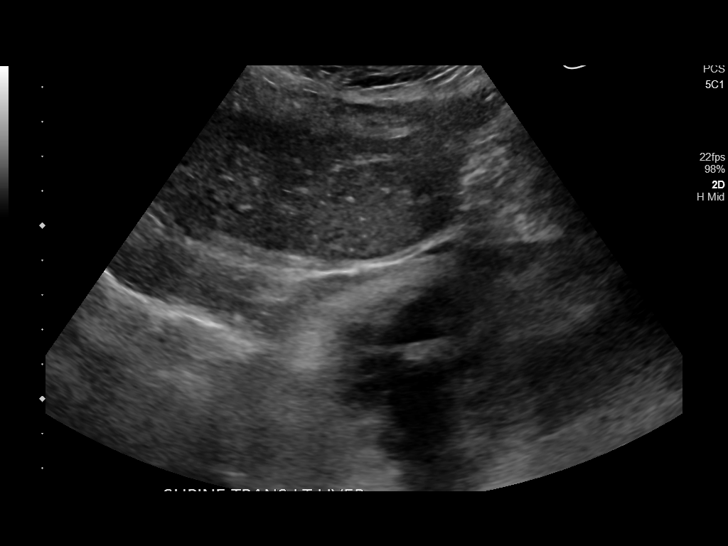
[im 31/68]
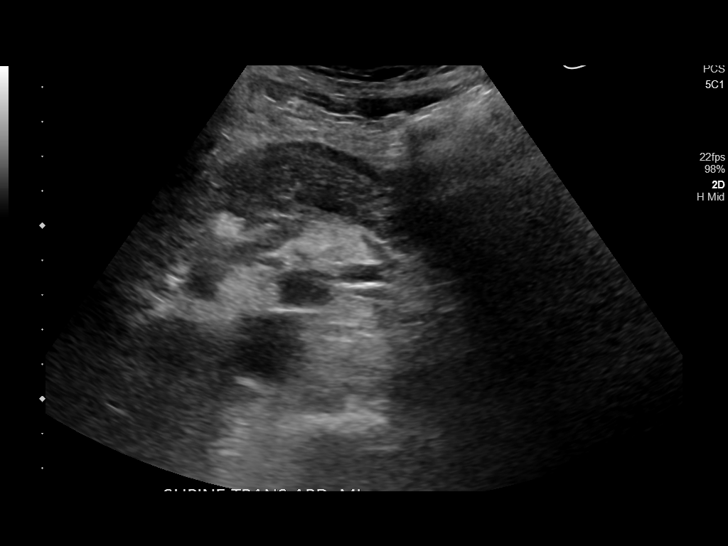
[im 37/68]
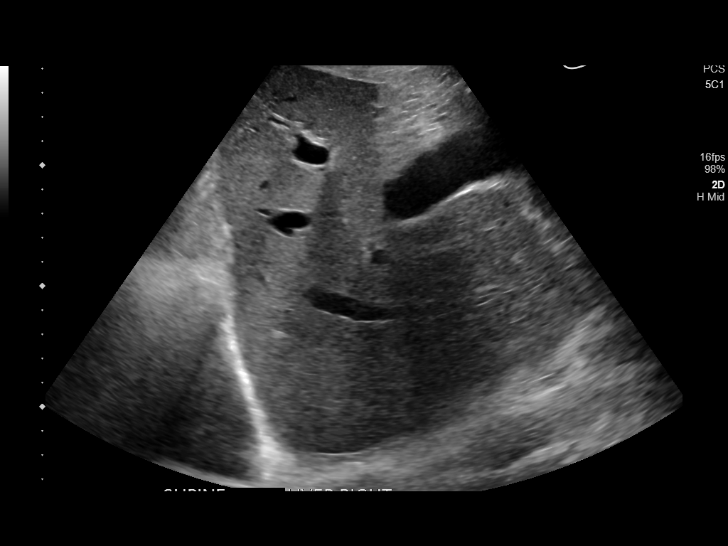
[im 42/68]
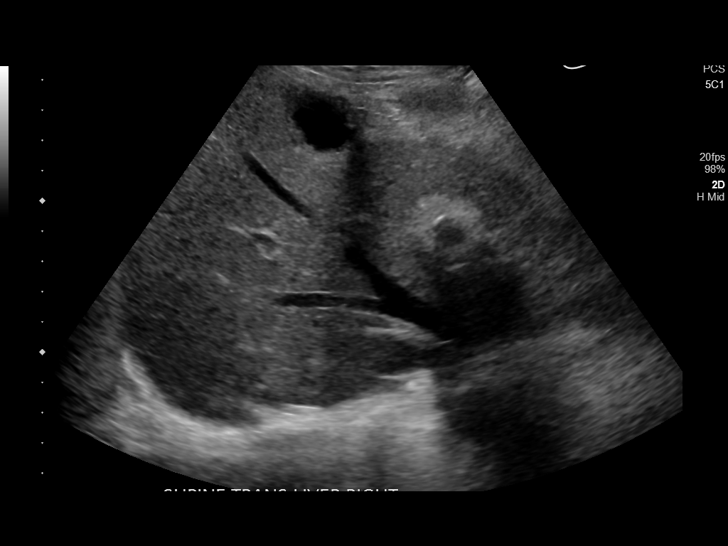
[im 45/68]
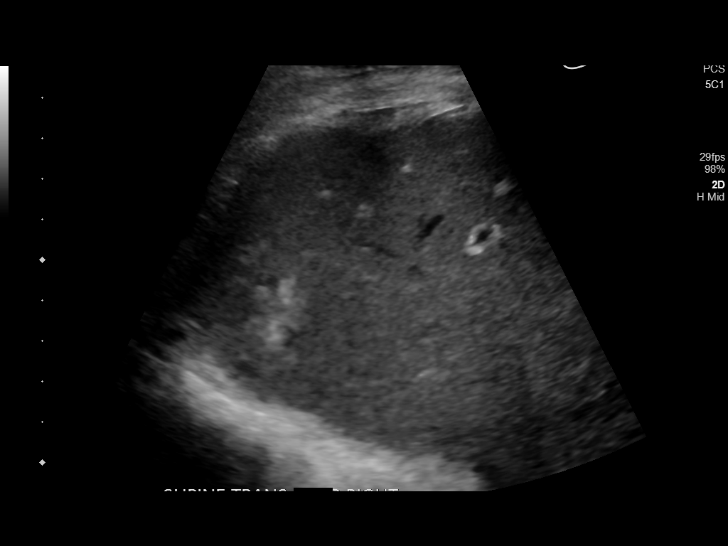
[im 51/68]
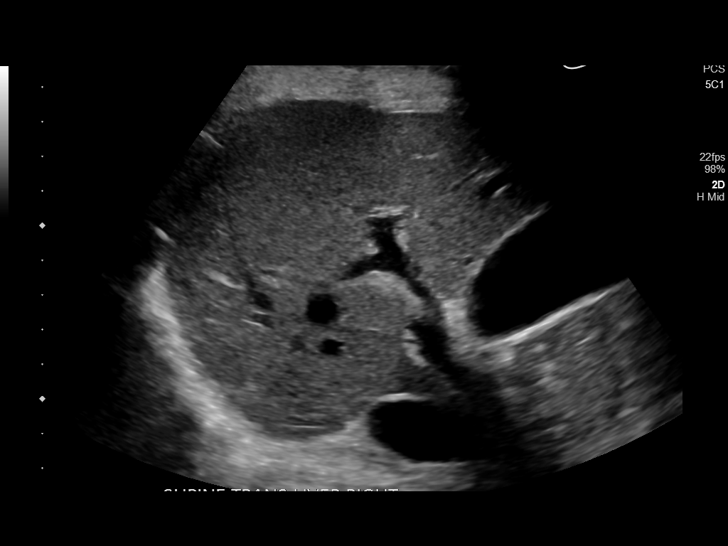
[im 56/68]
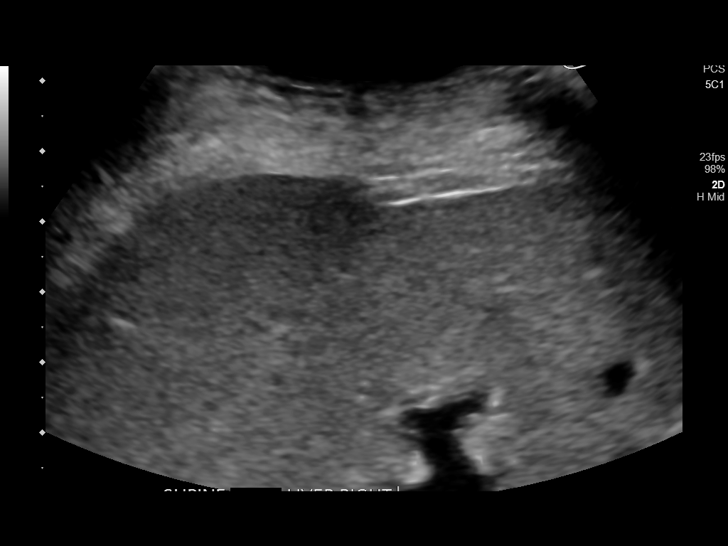
[im 62/68]
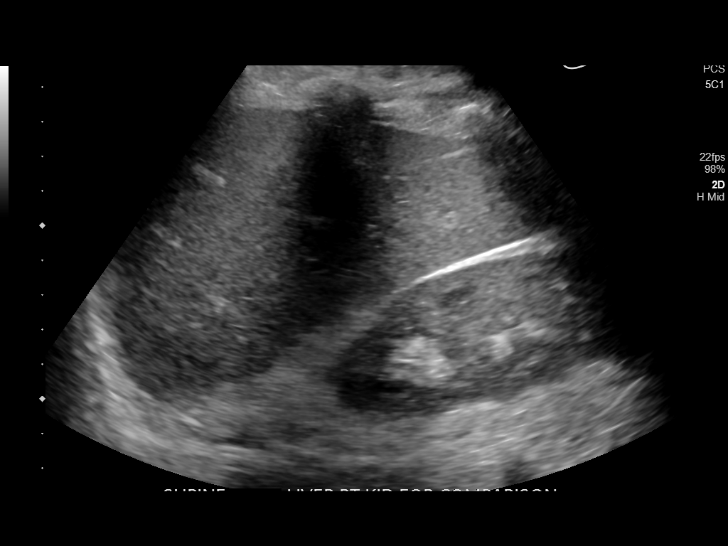
[im 68/68]
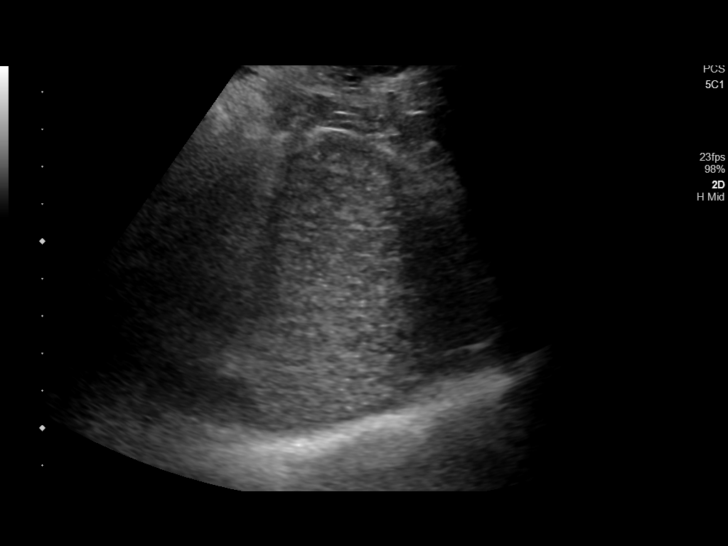

[14 of 25 positions shown; findings below may reference images not displayed]

FINDINGS: Gallbladder:

No gallstones or wall thickening visualized. Two polyps are
identified, the largest measuring 6 mm. No sonographic Murphy sign
noted by sonographer.

Common bile duct:

Diameter: 3 mm, normal.

Liver:

1.7 x 1.4 x 0.9 cm hyperechoic lesion in the right hepatic lobe.
Nodular contour with coarsened parenchymal echogenicity. Portal vein
is patent on color Doppler imaging with normal direction of blood
flow towards the liver.

Trace ascites.
IMPRESSION: 1. Findings consistent with cirrhosis.
2. 1.7 cm hyperechoic lesion in the right hepatic lobe. When the
patient is clinically stable and able to follow directions and hold
their breath (preferably as an outpatient) further evaluation with
dedicated abdominal MRI with and without contrast is recommended.
3. Two gallbladder polyps measuring up to 6 mm. No further follow-up
recommended. This recommendation follows ACR consensus guidelines:
White Paper of the ACR Incidental Findings Committee II on
Gallbladder and Biliary Findings. [HOSPITAL] [ZE]:;[DATE].

## 2018-07-09 MED ORDER — SODIUM CHLORIDE 0.9 % IV BOLUS
500.0000 mL | Freq: Once | INTRAVENOUS | Status: AC
  Administered 2018-07-09: 500 mL via INTRAVENOUS

## 2018-07-09 MED ORDER — VANCOMYCIN HCL 10 G IV SOLR
1500.0000 mg | INTRAVENOUS | Status: DC
  Filled 2018-07-09: qty 1500

## 2018-07-09 MED ORDER — SODIUM CHLORIDE 0.9 % IV SOLN
INTRAVENOUS | Status: DC
  Administered 2018-07-09 – 2018-07-10 (×3): via INTRAVENOUS

## 2018-07-09 MED ORDER — VANCOMYCIN HCL IN DEXTROSE 750-5 MG/150ML-% IV SOLN
750.0000 mg | Freq: Two times a day (BID) | INTRAVENOUS | Status: DC
  Administered 2018-07-10: 750 mg via INTRAVENOUS
  Filled 2018-07-09 (×2): qty 150

## 2018-07-09 MED ORDER — VANCOMYCIN HCL 10 G IV SOLR
1750.0000 mg | Freq: Once | INTRAVENOUS | Status: AC
  Administered 2018-07-09: 1750 mg via INTRAVENOUS
  Filled 2018-07-09: qty 1750

## 2018-07-09 MED ORDER — SODIUM CHLORIDE 0.9 % IV BOLUS
1000.0000 mL | Freq: Once | INTRAVENOUS | Status: AC
  Administered 2018-07-09: 1000 mL via INTRAVENOUS

## 2018-07-09 MED ORDER — VANCOMYCIN HCL 10 G IV SOLR
1500.0000 mg | Freq: Once | INTRAVENOUS | Status: DC
  Filled 2018-07-09: qty 1500

## 2018-07-09 NOTE — Consult Note (Signed)
Reason for Consult:Seizure Referring Physician: Anne Hahn  CC: Seizure  HPI: Trevor Lambert is an 58 y.o. male with a history of seizures and medication noncompliance who presents with breakthrough seizures. Patient has a fall of unknown origin on 1/16.  The family feels that he had a seizure.  Has been altered since that time.  Patient has a history of ETOH abuse as well.  It appears he has abruptly stopped drinking recently (for the past 2 days) and stopped taking his anticonvulsants about 2 weeks ago.    Past Medical History:  Diagnosis Date  . Seizures (HCC)     Past Surgical History:  Procedure Laterality Date  . NO PAST SURGERIES      No family history on file.  Social History:  reports that he has been smoking. He has never used smokeless tobacco. He reports current alcohol use. No history on file for drug.  No Known Allergies  Medications:  I have reviewed the patient's current medications. Prior to Admission:  Medications Prior to Admission  Medication Sig Dispense Refill Last Dose  . DILANTIN 100 MG ER capsule Take 300 mg by mouth at bedtime.   07/07/2018 at 1300  . levETIRAcetam (KEPPRA) 500 MG tablet Take 500 mg by mouth.   Past Month at Unknown time  . simvastatin (ZOCOR) 20 MG tablet Take 20 mg by mouth daily.      Scheduled: . folic acid  1 mg Oral Daily  . levETIRAcetam  500 mg Oral BID  . LORazepam  0-4 mg Intravenous Q6H   Followed by  . [START ON 07/11/2018] LORazepam  0-4 mg Intravenous Q12H  . multivitamin with minerals  1 tablet Oral Daily  . ofloxacin  2 drop Both Eyes QID  . phenytoin  300 mg Oral QHS  . thiamine  100 mg Oral Daily   Or  . thiamine  100 mg Intravenous Daily    ROS: History obtained from the patient  General ROS: negative for - chills, fatigue, fever, night sweats, weight gain or weight loss Psychological ROS: negative for - behavioral disorder, hallucinations, memory difficulties, mood swings or suicidal ideation Ophthalmic ROS:  purulent discharge from eyes ENT ROS: negative for - epistaxis, nasal discharge, oral lesions, sore throat, tinnitus or vertigo Allergy and Immunology ROS: negative for - hives or itchy/watery eyes Hematological and Lymphatic ROS: negative for - bleeding problems, bruising or swollen lymph nodes Endocrine ROS: negative for - galactorrhea, hair pattern changes, polydipsia/polyuria or temperature intolerance Respiratory ROS: negative for - cough, hemoptysis, shortness of breath or wheezing Cardiovascular ROS: negative for - chest pain, dyspnea on exertion, edema or irregular heartbeat Gastrointestinal ROS: negative for - abdominal pain, diarrhea, hematemesis, nausea/vomiting or stool incontinence Genito-Urinary ROS: negative for - dysuria, hematuria, incontinence or urinary frequency/urgency Musculoskeletal ROS: right leg pain, back pain Neurological ROS: as noted in HPI Dermatological ROS: negative for rash and skin lesion changes  Physical Examination: Blood pressure (!) 82/56, pulse 81, temperature (!) 97.4 F (36.3 C), temperature source Oral, resp. rate 17, height 5\' 5"  (1.651 m), weight 72.6 kg, SpO2 99 %.  HEENT-  Normocephalic, no lesions, without obvious abnormality.  Purulent discharge from eyes bilaterally. Normal external nose, mucus membranes and septum.  Normal pharynx. Cardiovascular- S1, S2 normal, pulses palpable throughout   Lungs- chest clear, no wheezing, rales, normal symmetric air entry, Heart exam - S1, S2 normal, no murmur, no gallop, rate regular Abdomen- soft, non-tender; bowel sounds normal; no masses,  no organomegaly Extremities- no edema  Lymph-no adenopathy palpable Musculoskeletal-pain on palpation of the right leg Skin-warm and dry, no hyperpigmentation, vitiligo, or suspicious lesions  Neurological Examination   Mental Status: Alert, oriented, thought content appropriate.  Speech fluent without evidence of aphasia.  Able to follow 3 step commands without  difficulty. Cranial Nerves: II: Discs flat bilaterally; Visual fields grossly normal, pupils equal, round, reactive to light and accommodation III,IV, VI: ptosis not present, extra-ocular motions intact bilaterally V,VII: smile symmetric, facial light touch sensation normal bilaterally VIII: hearing normal bilaterally IX,X: gag reflex present XI: bilateral shoulder shrug XII: midline tongue extension Motor: Right : Upper extremity   5/5    Left:     Upper extremity   5/5  Lower extremity   5/5     Lower extremity   5/5 Tone and bulk:normal tone throughout; no atrophy noted Sensory: Pinprick and light touch intact throughout, bilaterally Deep Tendon Reflexes: 2+ and symmetric with absent AJ's bilaterally Plantars: Right: mute   Left: mute Cerebellar: Normal finger-to-nose testing bilaterally Gait: not tested due to safety concerns    Laboratory Studies:   Basic Metabolic Panel: Recent Labs  Lab 07/08/18 1914 07/09/18 0630  NA 135 134*  K 3.1* 3.6  CL 98 103  CO2 26 23  GLUCOSE 68* 66*  BUN 6 9  CREATININE 1.02 0.96  CALCIUM 8.0* 7.0*    Liver Function Tests: Recent Labs  Lab 07/08/18 1914 07/09/18 0630  AST 40 46*  ALT 22 21  ALKPHOS 104 76  BILITOT 0.8 0.5  PROT 5.9* 4.9*  ALBUMIN 2.6* 2.0*   Recent Labs  Lab 07/08/18 1914  LIPASE 16   No results for input(s): AMMONIA in the last 168 hours.  CBC: Recent Labs  Lab 07/08/18 1914 07/09/18 0630  WBC 16.0* 9.2  HGB 10.0* 8.1*  HCT 31.0* 25.2*  MCV 89.1 89.0  PLT 81* 80*    Cardiac Enzymes: Recent Labs  Lab 07/08/18 1914  CKTOTAL 616*  TROPONINI <0.03    BNP: Invalid input(s): POCBNP  CBG: Recent Labs  Lab 07/08/18 1926  GLUCAP 57*    Microbiology: No results found for this or any previous visit.  Coagulation Studies: No results for input(s): LABPROT, INR in the last 72 hours.  Urinalysis:  Recent Labs  Lab 07/08/18 1914  COLORURINE YELLOW*  LABSPEC 1.010  PHURINE 5.0   GLUCOSEU 50*  HGBUR MODERATE*  BILIRUBINUR NEGATIVE  KETONESUR 20*  PROTEINUR NEGATIVE  NITRITE NEGATIVE  LEUKOCYTESUR MODERATE*    Lipid Panel:  No results found for: CHOL, TRIG, HDL, CHOLHDL, VLDL, LDLCALC  HgbA1C: No results found for: HGBA1C  Urine Drug Screen:      Component Value Date/Time   LABOPIA NONE DETECTED 07/08/2018 1914   COCAINSCRNUR NONE DETECTED 07/08/2018 1914   LABBENZ NONE DETECTED 07/08/2018 1914   AMPHETMU NONE DETECTED 07/08/2018 1914   THCU NONE DETECTED 07/08/2018 1914   LABBARB NONE DETECTED 07/08/2018 1914    Alcohol Level:  Recent Labs  Lab 07/08/18 1914  ETH <10    Other results: EKG: sinus tachycardia at 103 bpm.  Imaging: Ct Head Wo Contrast  Result Date: 07/08/2018 CLINICAL DATA:  Unwitnessed seizure on Sunday. Patient drinks alcohol daily. EXAM: CT HEAD WITHOUT CONTRAST CT CERVICAL SPINE WITHOUT CONTRAST TECHNIQUE: Multidetector CT imaging of the head and cervical spine was performed following the standard protocol without intravenous contrast. Multiplanar CT image reconstructions of the cervical spine were also generated. COMPARISON:  None. FINDINGS: CT HEAD FINDINGS Brain: Involutional changes the brain  slightly advanced for age with chronic appearing microvascular ischemic disease. No large vascular territory infarct, hemorrhage, midline shift or edema. No intra-axial mass nor extra-axial fluid. No hydrocephalus. Midline fourth ventricle basal cisterns without effacement. Vascular: Mild atherosclerosis of the carotid siphons. No hyperdense vessels. Skull: No skull fracture. Sinuses/Orbits: Intact orbits and globes. Ethmoid sinus mucosal thickening is noted on right. Other: Small left frontal scalp contusion with swelling. CT CERVICAL SPINE FINDINGS Alignment: Reversal cervical lordosis. Skull base and vertebrae: No acute fracture. No primary bone lesion or focal pathologic process. Soft tissues and spinal canal: No prevertebral fluid or  swelling. No visible canal hematoma. Disc levels: No significant central foraminal stenosis. No jumped or perched facets. 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 Upper chest: Clear Other: None IMPRESSION: 1. Small left frontal scalp contusion with swelling. No underlying skull fracture. 2. Chronic appearing minimal small vessel ischemic disease of periventricular white matter. No acute intracranial abnormality. 3. No acute posttraumatic cervical spine fracture or subluxation. Electronically Signed   By: Tollie Eth M.D.   On: 07/08/2018 20:39   Ct Cervical Spine Wo Contrast  Result Date: 07/08/2018 CLINICAL DATA:  Unwitnessed seizure on Sunday. Patient drinks alcohol daily. EXAM: CT HEAD WITHOUT CONTRAST CT CERVICAL SPINE WITHOUT CONTRAST TECHNIQUE: Multidetector CT imaging of the head and cervical spine was performed following the standard protocol without intravenous contrast. Multiplanar CT image reconstructions of the cervical spine were also generated. COMPARISON:  None. FINDINGS: CT HEAD FINDINGS Brain: Involutional changes the brain slightly advanced for age with chronic appearing microvascular ischemic disease. No large vascular territory infarct, hemorrhage, midline shift or edema. No intra-axial mass nor extra-axial fluid. No hydrocephalus. Midline fourth ventricle basal cisterns without effacement. Vascular: Mild atherosclerosis of the carotid siphons. No hyperdense vessels. Skull: No skull fracture. Sinuses/Orbits: Intact orbits and globes. Ethmoid sinus mucosal thickening is noted on right. Other: Small left frontal scalp contusion with swelling. CT CERVICAL SPINE FINDINGS Alignment: Reversal cervical lordosis. Skull base and vertebrae: No acute fracture. No primary bone lesion or focal pathologic process. Soft tissues and spinal canal: No prevertebral fluid or swelling. No visible canal hematoma. Disc levels: No significant central foraminal stenosis. No  jumped or perched facets. 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 Upper chest: Clear Other: None IMPRESSION: 1. Small left frontal scalp contusion with swelling. No underlying skull fracture. 2. Chronic appearing minimal small vessel ischemic disease of periventricular white matter. No acute intracranial abnormality. 3. No acute posttraumatic cervical spine fracture or subluxation. Electronically Signed   By: Tollie Eth M.D.   On: 07/08/2018 20:39   Dg Chest Port 1 View  Result Date: 07/08/2018 CLINICAL DATA:  Altered mental status EXAM: PORTABLE CHEST 1 VIEW COMPARISON:  None. FINDINGS: The heart size and mediastinal contours are within normal limits. Both lungs are clear. The visualized skeletal structures are unremarkable. IMPRESSION: No active disease. Electronically Signed   By: Jasmine Pang M.D.   On: 07/08/2018 20:30   Dg Femur Min 2 Views Right  Result Date: 07/08/2018 CLINICAL DATA:  Fall with leg pain EXAM: RIGHT FEMUR 2 VIEWS COMPARISON:  None. FINDINGS: There is no evidence of fracture or other focal bone lesions. Soft tissues are unremarkable. IMPRESSION: Negative. Electronically Signed   By: Jasmine Pang M.D.   On: 07/08/2018 20:31     Assessment/Plan: 58 year old male with a history of seizures presenting with breakthrough seizures, noncompliant with medications.  Patient restarted on his home  medications.  Has had no further seizures since admission.  Head CT reviewed and shows no acute changes.    Recommendations: 1.  Continue Keppra at 500mg  BID and Dilantin at 300mg  daily 2.  Dilantin level 3.  Seizure precautions 4.  Ativan prn breakthrough seizure activity 5.  Patient unable to drive, operate heavy machinery, perform activities at heights and participate in water activities until release by outpatient physician. 6.  CIWA  Thana Farr, MD Neurology [phone] 07/09/2018, 11:20 AM

## 2018-07-09 NOTE — Progress Notes (Signed)
MD notified of MEWS score and low BP (pt appears to be asymptomatic). Orders for 1L NS bolus x 1. Bolus given, will continue to monitor.

## 2018-07-09 NOTE — Progress Notes (Signed)
[facility] - [place] [facility]   PATIENT NAME: Trevor Lambert    MR#:  [id number]  DATE OF BIRTH:  05/23/1960  SUBJECTIVE:  CHIEF COMPLAINT:   Chief Complaint  Patient presents with  . Abdominal Pain  . Seizures  . Fall   No new complaints this morning.  Patient however reported to be hypotensive.  Patient has received a total of 3 L of IV fluids today and most recent blood pressure was 77/61.  Patient however remains asymptomatic.  Decision made to transfer patient to ICU in case patient request to be placed on pressors. Updated patient's mother at bedside on treatment on transfer plans.   REVIEW OF SYSTEMS:  Review of Systems  Constitutional: Negative for chills and fever.  HENT: Negative for hearing loss and tinnitus.   Eyes: Negative for double vision.       Mucopurulent discharge from the eyes.  Respiratory: Negative for cough, hemoptysis and shortness of breath.   Cardiovascular: Negative for chest pain.  Gastrointestinal: Negative for heartburn and nausea.  Genitourinary: Negative for dysuria and urgency.  Musculoskeletal: Negative for myalgias and neck pain.  Skin: Negative for rash.  Neurological: Negative for dizziness and headaches. Seizures:    Psychiatric/Behavioral: Negative for depression and hallucinations.    DRUG ALLERGIES:  No Known Allergies VITALS:  Blood pressure (!) 77/61, pulse 86, temperature (!) 97.4 F (36.3 C), temperature source Oral, resp. rate 18, height 5\' 5"  (1.651 m), weight 72.6 kg, SpO2 100 %. PHYSICAL EXAMINATION:  Constitutional: He is oriented to person, place, and time. He appears well-developed and well-nourished. No distress.  HENT:  Head: Normocephalic and atraumatic.  Mouth/Throat: Oropharynx is clear and moist.  Eyes: Pupils are equal, round, and reactive to light. EOM are normal. No scleral icterus.  Bilateral purulent discharge  Neck: Normal range of motion. Neck supple. No JVD present. No  thyromegaly present.  Cardiovascular: Normal rate, regular rhythm and intact distal pulses. Exam reveals no gallop and no friction rub.  No murmur heard. Respiratory: Effort normal and breath sounds normal. No respiratory distress. He has no wheezes. He has no rales.  GI: Soft. Bowel sounds are normal. He exhibits no distension. There is no abdominal tenderness.  Musculoskeletal: Normal range of motion. Neurological: He is alert and oriented to person, place, and time. No cranial nerve deficit.  No dysarthria, no aphasia  Skin: Skin is warm and dry. No rash noted. No erythema.  Psychiatric: He has a normal mood and affect. His behavior is normal. Judgment and thought content normal.   LABORATORY PANEL:  Male CBC Recent Labs  Lab 07/09/18 0630  WBC 9.2  HGB 8.1*  HCT 25.2*  PLT 80*   ------------------------------------------------------------------------------------------------------------------ Chemistries  Recent Labs  Lab 07/09/18 0630  NA 134*  K 3.6  CL 103  CO2 23  GLUCOSE 66*  BUN 9  CREATININE 0.96  CALCIUM 7.0*  AST 46*  ALT 21  ALKPHOS 76  BILITOT 0.5   RADIOLOGY:  Ct Head Wo Contrast  Result Date: 07/08/2018 CLINICAL DATA:  Unwitnessed seizure on Sunday. Patient drinks alcohol daily. EXAM: CT HEAD WITHOUT CONTRAST CT CERVICAL SPINE WITHOUT CONTRAST TECHNIQUE: Multidetector CT imaging of the head and cervical spine was performed following the standard protocol without intravenous contrast. Multiplanar CT image reconstructions of the cervical spine were also generated. COMPARISON:  None. FINDINGS: CT HEAD FINDINGS Brain: Involutional changes the brain slightly advanced for age with chronic appearing microvascular ischemic disease. No large vascular territory  infarct, hemorrhage, midline shift or edema. No intra-axial mass nor extra-axial fluid. No hydrocephalus. Midline fourth ventricle basal cisterns without effacement. Vascular: Mild atherosclerosis of the  carotid siphons. No hyperdense vessels. Skull: No skull fracture. Sinuses/Orbits: Intact orbits and globes. Ethmoid sinus mucosal thickening is noted on right. Other: Small left frontal scalp contusion with swelling. CT CERVICAL SPINE FINDINGS Alignment: Reversal cervical lordosis. Skull base and vertebrae: No acute fracture. No primary bone lesion or focal pathologic process. Soft tissues and spinal canal: No prevertebral fluid or swelling. No visible canal hematoma. Disc levels: No significant central foraminal stenosis. No jumped or perched facets. 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 Upper chest: Clear Other: None IMPRESSION: 1. Small left frontal scalp contusion with swelling. No underlying skull fracture. 2. Chronic appearing minimal small vessel ischemic disease of periventricular white matter. No acute intracranial abnormality. 3. No acute posttraumatic cervical spine fracture or subluxation. Electronically Signed   By: Tollie Eth M.D.   On: 07/08/2018 20:39   Ct Cervical Spine Wo Contrast  Result Date: 07/08/2018 CLINICAL DATA:  Unwitnessed seizure on Sunday. Patient drinks alcohol daily. EXAM: CT HEAD WITHOUT CONTRAST CT CERVICAL SPINE WITHOUT CONTRAST TECHNIQUE: Multidetector CT imaging of the head and cervical spine was performed following the standard protocol without intravenous contrast. Multiplanar CT image reconstructions of the cervical spine were also generated. COMPARISON:  None. FINDINGS: CT HEAD FINDINGS Brain: Involutional changes the brain slightly advanced for age with chronic appearing microvascular ischemic disease. No large vascular territory infarct, hemorrhage, midline shift or edema. No intra-axial mass nor extra-axial fluid. No hydrocephalus. Midline fourth ventricle basal cisterns without effacement. Vascular: Mild atherosclerosis of the carotid siphons. No hyperdense vessels. Skull: No skull fracture. Sinuses/Orbits: Intact orbits  and globes. Ethmoid sinus mucosal thickening is noted on right. Other: Small left frontal scalp contusion with swelling. CT CERVICAL SPINE FINDINGS Alignment: Reversal cervical lordosis. Skull base and vertebrae: No acute fracture. No primary bone lesion or focal pathologic process. Soft tissues and spinal canal: No prevertebral fluid or swelling. No visible canal hematoma. Disc levels: No significant central foraminal stenosis. No jumped or perched facets. 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 Upper chest: Clear Other: None IMPRESSION: 1. Small left frontal scalp contusion with swelling. No underlying skull fracture. 2. Chronic appearing minimal small vessel ischemic disease of periventricular white matter. No acute intracranial abnormality. 3. No acute posttraumatic cervical spine fracture or subluxation. Electronically Signed   By: Tollie Eth M.D.   On: 07/08/2018 20:39   Dg Chest Port 1 View  Result Date: 07/08/2018 CLINICAL DATA:  Altered mental status EXAM: PORTABLE CHEST 1 VIEW COMPARISON:  None. FINDINGS: The heart size and mediastinal contours are within normal limits. Both lungs are clear. The visualized skeletal structures are unremarkable. IMPRESSION: No active disease. Electronically Signed   By: Jasmine Pang M.D.   On: 07/08/2018 20:30   Dg Femur Min 2 Views Right  Result Date: 07/08/2018 CLINICAL DATA:  Fall with leg pain EXAM: RIGHT FEMUR 2 VIEWS COMPARISON:  None. FINDINGS: There is no evidence of fracture or other focal bone lesions. Soft tissues are unremarkable. IMPRESSION: Negative. Electronically Signed   By: Jasmine Pang M.D.   On: 07/08/2018 20:31   US Abdomen Limited Ruq  Result Date: 07/09/2018 CLINICAL DATA:  Evaluate for cirrhosis. EXAM: ULTRASOUND ABDOMEN LIMITED RIGHT UPPER QUADRANT COMPARISON:  None. FINDINGS: Gallbladder: No gallstones or wall thickening visualized. Two polyps are identified, the largest measuring  6 mm. No  sonographic Murphy sign noted by sonographer. Common bile duct: Diameter: 3 mm, normal. Liver: 1.7 x 1.4 x 0.9 cm hyperechoic lesion in the right hepatic lobe. Nodular contour with coarsened parenchymal echogenicity. Portal vein is patent on color Doppler imaging with normal direction of blood flow towards the liver. Trace ascites. IMPRESSION: 1. Findings consistent with cirrhosis. 2. 1.7 cm hyperechoic lesion in the right hepatic lobe. When the patient is clinically stable and able to follow directions and hold their breath (preferably as an outpatient) further evaluation with dedicated abdominal MRI with and without contrast is recommended. 3. Two gallbladder polyps measuring up to 6 mm. No further follow-up recommended. This recommendation follows ACR consensus guidelines: White Paper of the ACR Incidental Findings Committee II on Gallbladder and Biliary Findings. J Am Coll Radiol 2013:;10:953-956. Electronically Signed   By: Obie Dredge M.D.   On: 07/09/2018 11:41   ASSESSMENT AND PLAN:   1.  Sepsis secondary to probable urinary tract infection.   Concern for impending septic shock.  Patient has received total of 3 L of IV fluids today and most recent blood pressure of 77/61.  Patient however asymptomatic.   Being transferred to ICU for closer monitoring.  Continue IV fluids.   Repeat stat chest x-ray done to ensure no evidence of fluid overload.   Patient was initially started on IV Rocephin.  Added IV vancomycin for gram-positive coverage pending results of blood cultures.    2.  Acute encephalopathy -due to UTI versus his seizures.  Improved.  Patient awake and alert and oriented this morning.  3.  Seizures Patient presented with breakthrough seizures secondary to noncompliance with meds. Continue Keppra and Dilantin.  Seizure precautions.  Dilantin level.  4.  Alcohol abuse -CIWA protocol, alcohol level was negative in the ED, this could potentially have lowered his seizure  threshold Continue multivitamin, thiamine and folic acid   5.  Bilateral bacterial conjunctivitis Continue ofloxacin eyedrops  6.  Liver cirrhosis Patient had liver ultrasound done today which revealed findings consistent with cirrhosis. 1.7 cm hyperechoic lesion in the right hepatic lobe. When thepatient is clinically stable and able to follow directions and hold their breath (preferably as an outpatient) further evaluation with dedicated abdominal MRI with and without contrast is recommended. Updated mother at bedside on findings  7.  Thrombocytopenia Most likely secondary to chronic alcohol abuse.  Monitor.  DVT prophylaxis; SCDs   All the records are reviewed and case discussed with Care Management/Social Worker. Management plans discussed with the patient, family and they are in agreement.  CODE STATUS: Full Code  TOTAL TIME TAKING CARE OF THIS PATIENT: 38 minutes.   More than 50% of the time was spent in counseling/coordination of care: YES  POSSIBLE D/C IN 3 DAYS, DEPENDING ON CLINICAL CONDITION.   Lynne Takemoto M.D on 07/09/2018 at 2:13 PM  Between 7am to 6pm - Pager - [phone]  After 6pm go to [email or web address] - Social research officer, government  [facility] [place] [facility]  Office  [phone]  CC: Primary care physician; Josefine Class, Hanley Hays, MD  Note: This dictation was prepared with Dragon dictation along with smaller phrase technology. Any transcriptional errors that result from this process are unintentional.

## 2018-07-09 NOTE — Consult Note (Signed)
Pharmacy Antibiotic Note  Trevor Lambert is a 58 y.o. male admitted on 07/08/2018 with sepsis.  Pharmacy has been consulted for Vancomycin dosing. Patient is also receiving Ceftriaxone.   Plan: Will order vancomycin 1750mg  IV x 1 dose followed by Vancomycin 750 mg IV Q 12 hrs. Goal AUC 400-550. Expected AUC: 431 SCr used: 0.96   Height: 5\' 5"  (165.1 cm) Weight: 160 lb (72.6 kg) IBW/kg (Calculated) : 61.5  Temp (24hrs), Avg:98.5 F (36.9 C), Min:97.4 F (36.3 C), Max:99.2 F (37.3 C)  Recent Labs  Lab 07/08/18 1914 07/08/18 2201 07/09/18 0630  WBC 16.0*  --  9.2  CREATININE 1.02  --  0.96  LATICACIDVEN  --  0.7  --     Estimated Creatinine Clearance: 74.7 mL/min (by C-G formula based on SCr of 0.96 mg/dL).    No Known Allergies  Antimicrobials this admission: 2/21 ceftriaxone  >>  2/22 vancomycin >>   Dose adjustments this admission:  Microbiology results: 2/22 UCx: pending  Thank you for allowing pharmacy to be a part of this patient's care.  Gardner Candle, PharmD, BCPS Clinical Pharmacist 07/09/2018 2:23 PM

## 2018-07-10 LAB — URINE CULTURE: Culture: <10000 — AB

## 2018-07-10 LAB — BASIC METABOLIC PANEL
Anion gap: 4 — ABNORMAL LOW (ref 5–15)
BUN: 10 mg/dL (ref 6–20)
CO2: 21 mmol/L — AB (ref 22–32)
Calcium: 6.5 mg/dL — ABNORMAL LOW (ref 8.9–10.3)
Chloride: 110 mmol/L (ref 98–111)
Creatinine, Ser: 0.76 mg/dL (ref 0.61–1.24)
GFR calc Af Amer: >60 mL/min (ref >60)
GFR calc non Af Amer: >60 mL/min (ref >60)
Glucose, Bld: 100 mg/dL — ABNORMAL HIGH (ref 70–99)
POTASSIUM: 3.1 mmol/L — AB (ref 3.5–5.1)
Sodium: 135 mmol/L (ref 135–145)

## 2018-07-10 LAB — CBC
HCT: 24.5 % — ABNORMAL LOW (ref 39.0–52.0)
Hemoglobin: 7.8 g/dL — ABNORMAL LOW (ref 13.0–17.0)
MCH: 29.1 pg (ref 26.0–34.0)
MCHC: 31.8 g/dL (ref 30.0–36.0)
MCV: 91.4 fL (ref 80.0–100.0)
Platelets: 71 10*3/uL — ABNORMAL LOW (ref 150–400)
RBC: 2.68 MIL/uL — AB (ref 4.22–5.81)
RDW: 14.6 % (ref 11.5–15.5)
WBC: 6.6 10*3/uL (ref 4.0–10.5)
nRBC: 0 % (ref 0.0–0.2)

## 2018-07-10 LAB — PHENYTOIN LEVEL, TOTAL: Phenytoin Lvl: 21.4 ug/mL — ABNORMAL HIGH (ref 10.0–20.0)

## 2018-07-10 LAB — GLUCOSE, CAPILLARY: GLUCOSE-CAPILLARY: 85 mg/dL (ref 70–99)

## 2018-07-10 LAB — MAGNESIUM: Magnesium: 1.2 mg/dL — ABNORMAL LOW (ref 1.7–2.4)

## 2018-07-10 MED ORDER — TRAMADOL HCL 50 MG PO TABS
50.0000 mg | ORAL_TABLET | Freq: Four times a day (QID) | ORAL | Status: DC | PRN
  Administered 2018-07-10 – 2018-07-12 (×5): 50 mg via ORAL
  Filled 2018-07-10 (×5): qty 1

## 2018-07-10 MED ORDER — MAGNESIUM SULFATE 4 GM/100ML IV SOLN
4.0000 g | Freq: Once | INTRAVENOUS | Status: AC
  Administered 2018-07-10: 4 g via INTRAVENOUS
  Filled 2018-07-10 (×2): qty 100

## 2018-07-10 MED ORDER — AMOXICILLIN-POT CLAVULANATE 875-125 MG PO TABS
1.0000 | ORAL_TABLET | Freq: Two times a day (BID) | ORAL | Status: DC
  Administered 2018-07-10 – 2018-07-12 (×5): 1 via ORAL
  Filled 2018-07-10 (×5): qty 1

## 2018-07-10 MED ORDER — NICOTINE 21 MG/24HR TD PT24
21.0000 mg | MEDICATED_PATCH | Freq: Every day | TRANSDERMAL | Status: DC
  Administered 2018-07-10 – 2018-07-12 (×3): 21 mg via TRANSDERMAL
  Filled 2018-07-10 (×3): qty 1

## 2018-07-10 MED ORDER — PHENYTOIN SODIUM EXTENDED 100 MG PO CAPS
200.0000 mg | ORAL_CAPSULE | Freq: Every day | ORAL | Status: DC
  Filled 2018-07-10: qty 2

## 2018-07-10 MED ORDER — SODIUM CHLORIDE 0.9 % IV BOLUS
500.0000 mL | Freq: Once | INTRAVENOUS | Status: AC
  Administered 2018-07-10: 500 mL via INTRAVENOUS

## 2018-07-10 MED ORDER — POTASSIUM CHLORIDE CRYS ER 20 MEQ PO TBCR
40.0000 meq | EXTENDED_RELEASE_TABLET | ORAL | Status: AC
  Administered 2018-07-10 (×2): 40 meq via ORAL
  Filled 2018-07-10 (×2): qty 2

## 2018-07-10 NOTE — Progress Notes (Signed)
Subjective: Patient remains seizure free.  Has been placed back on home medications  Objective: Current vital signs: BP (!) 81/55   Pulse 87   Temp 98.5 F (36.9 C) (Oral)   Resp (!) 22   Ht  (1.676 m)   Wt 61.4 kg   SpO2 95%   BMI 21.85 kg/m  Vital signs in last 24 hours: Temp:  [98.5 F (36.9 C)-98.8 F (37.1 C)] 98.5 F (36.9 C) (02/23 0400) Pulse Rate:  [81-95] 87 (02/23 0700) Resp:  [16-33] 22 (02/23 0630) BP: (75-101)/(50-78) 81/55 (02/23 0700) SpO2:  [92 %-100 %] 95 % (02/23 0630) Weight:  [61.4 kg] 61.4 kg (02/22 1630)  Intake/Output from previous day: 02/22 0701 - 02/23 0700 In: 6748.4 [I.V.:1634.7; IV Piggyback:5113.6] Out: 300 [Urine:300] Intake/Output this shift: Total I/O In: 360 [P.O.:360] Out: -  Nutritional status:  Diet Order            DIET DYS 3 Room service appropriate? Yes with Assist; Fluid consistency: Thin  Diet effective now              Neurologic Exam: Mental Status: Alert, oriented, thought content appropriate.  Speech fluent without evidence of aphasia.  Able to follow 3 step commands without difficulty. Cranial Nerves: II: DVisual fields grossly normal, pupils equal, round, reactive to light and accommodation III,IV, VI: ptosis not present, extra-ocular motions intact bilaterally V,VII: smile symmetric, facial light touch sensation normal bilaterally VIII: hearing normal bilaterally IX,X: gag reflex present XI: bilateral shoulder shrug XII: midline tongue extension Motor: 5/5 throughout   Lab Results: Basic Metabolic Panel: Recent Labs  Lab 07/08/18 1914 07/09/18 0630 07/10/18 0506  NA 135 134* 135  K 3.1* 3.6 3.1*  CL 98 103 110  CO2 26 23 21*  GLUCOSE 68* 66* 100*  BUN CREATININE 1.02 0.96 0.76  CALCIUM 8.0* 7.0* 6.5*  MG  --   --  1.2*    Liver Function Tests: Recent Labs  Lab 07/08/18 1914 07/09/18 0630  AST 40 46*  ALT 22 21  ALKPHOS 104 76  BILITOT 0.8 0.5  PROT 5.9* 4.9*  ALBUMIN  2.6* 2.0*   Recent Labs  Lab 07/08/18 1914  LIPASE 16   No results for input(s): AMMONIA in the last 168 hours.  CBC: Recent Labs  Lab 07/08/18 1914 07/09/18 0630 07/10/18 0506  WBC 16.0* 9.2 6.6  HGB 10.0* 8.1* 7.8*  HCT 31.0* 25.2* 24.5*  MCV 89.1 89.0 91.4  PLT 81* 80* 71*    Cardiac Enzymes: Recent Labs  Lab 07/08/18 1914  CKTOTAL 616*  TROPONINI <0.03    Lipid Panel: No results for input(s): CHOL, TRIG, HDL, CHOLHDL, VLDL, LDLCALC in the last 168 hours.  CBG: Recent Labs  Lab 07/08/18 1926 07/09/18 1205  GLUCAP 57* 87    Microbiology: Results for orders placed or performed during the hospital encounter of 07/08/18  Urine culture     Status: Abnormal   Collection Time: 07/08/18  7:14 PM  Result Value Ref Range Status   Specimen Description   Final    URINE, CLEAN CATCH Performed at [facility], [address], [place], [place] [address]    Special Requests   Final    NONE Performed at [facility], [address], [place], [place] [address]    Culture (A)  Final    <10,000 COLONIES/mL INSIGNIFICANT GROWTH Performed at [facility], [address], [place], [place] [address]  Report Status 07/10/2018 FINAL  Final  MRSA PCR Screening     Status: None   Collection Time: 07/09/18  5:47 PM  Result Value Ref Range Status   MRSA by PCR NEGATIVE NEGATIVE Final    Comment:        The GeneXpert MRSA Assay (FDA approved for NASAL specimens only), is one component of a comprehensive MRSA colonization surveillance program. It is not intended to diagnose MRSA infection nor to guide or monitor treatment for MRSA infections. Performed at [facility] [address] [place], [place] [address]     Coagulation Studies: No results for input(s): LABPROT, INR in the last 72 hours.  Imaging: Dg Chest 1 View  Result Date: 07/09/2018 CLINICAL DATA:  Shortness of breath.  Ex-smoker. EXAM: CHEST  1 VIEW COMPARISON:   07/08/2018 FINDINGS: Midline trachea. Normal heart size and mediastinal contours. No pleural effusion or pneumothorax. Developing medial right lower lobe and possible left retrocardiac airspace disease. There is also increased right upper lobe opacity just above the major fissure. IMPRESSION: Developing right and possible left-sided airspace disease. Favor infection or aspiration. Asymmetric pulmonary edema felt less likely. Electronically Signed   By: Jeronimo Greaves M.D.   On: 07/09/2018 14:50   Ct Head Wo Contrast  Result Date: 07/08/2018 CLINICAL DATA:  Unwitnessed seizure on Sunday. Patient drinks alcohol daily. EXAM: CT HEAD WITHOUT CONTRAST CT CERVICAL SPINE WITHOUT CONTRAST TECHNIQUE: Multidetector CT imaging of the head and cervical spine was performed following the standard protocol without intravenous contrast. Multiplanar CT image reconstructions of the cervical spine were also generated. COMPARISON:  None. FINDINGS: CT HEAD FINDINGS Brain: Involutional changes the brain slightly advanced for age with chronic appearing microvascular ischemic disease. No large vascular territory infarct, hemorrhage, midline shift or edema. No intra-axial mass nor extra-axial fluid. No hydrocephalus. Midline fourth ventricle basal cisterns without effacement. Vascular: Mild atherosclerosis of the carotid siphons. No hyperdense vessels. Skull: No skull fracture. Sinuses/Orbits: Intact orbits and globes. Ethmoid sinus mucosal thickening is noted on right. Other: Small left frontal scalp contusion with swelling. CT CERVICAL SPINE FINDINGS Alignment: Reversal cervical lordosis. Skull base and vertebrae: No acute fracture. No primary bone lesion or focal pathologic process. Soft tissues and spinal canal: No prevertebral fluid or swelling. No visible canal hematoma. Disc levels: No significant central foraminal stenosis. No jumped or perched facets. Uncovertebral joint osteoarthritis with spurring bilaterally at C2-3, C3-4 and  C6-7. Upper chest: Clear Other: None IMPRESSION: 1. Small left frontal scalp contusion with swelling. No underlying skull fracture. 2. Chronic appearing minimal small vessel ischemic disease of periventricular white matter. No acute intracranial abnormality. 3. No acute posttraumatic cervical spine fracture or subluxation. Electronically Signed   By: Tollie Eth M.D.   On: 07/08/2018 20:39   Ct Cervical Spine Wo Contrast  Result Date: 07/08/2018 CLINICAL DATA:  Unwitnessed seizure on Sunday. Patient drinks alcohol daily. EXAM: CT HEAD WITHOUT CONTRAST CT CERVICAL SPINE WITHOUT CONTRAST TECHNIQUE: Multidetector CT imaging of the head and cervical spine was performed following the standard protocol without intravenous contrast. Multiplanar CT image reconstructions of the cervical spine were also generated. COMPARISON:  None. FINDINGS: CT HEAD FINDINGS Brain: Involutional changes the brain slightly advanced for age with chronic appearing microvascular ischemic disease. No large vascular territory infarct, hemorrhage, midline shift or edema. No intra-axial mass nor extra-axial fluid. No hydrocephalus. Midline fourth ventricle basal cisterns without effacement. Vascular: Mild atherosclerosis of the carotid siphons. No hyperdense vessels. Skull: No skull fracture. Sinuses/Orbits: Intact orbits and  globes. Ethmoid sinus mucosal thickening is noted on right. Other: Small left frontal scalp contusion with swelling. CT CERVICAL SPINE FINDINGS Alignment: Reversal cervical lordosis. Skull base and vertebrae: No acute fracture. No primary bone lesion or focal pathologic process. Soft tissues and spinal canal: No prevertebral fluid or swelling. No visible canal hematoma. Disc levels: No significant central foraminal stenosis. No jumped or perched facets. 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 Upper chest: Clear Other: None IMPRESSION: 1. Small left frontal scalp contusion with  swelling. No underlying skull fracture. 2. Chronic appearing minimal small vessel ischemic disease of periventricular white matter. No acute intracranial abnormality. 3. No acute posttraumatic cervical spine fracture or subluxation. Electronically Signed   By: Tollie Eth M.D.   On: 07/08/2018 20:39   Dg Chest Port 1 View  Result Date: 07/08/2018 CLINICAL DATA:  Altered mental status EXAM: PORTABLE CHEST 1 VIEW COMPARISON:  None. FINDINGS: The heart size and mediastinal contours are within normal limits. Both lungs are clear. The visualized skeletal structures are unremarkable. IMPRESSION: No active disease. Electronically Signed   By: Jasmine Pang M.D.   On: 07/08/2018 20:30   Dg Femur Min 2 Views Right  Result Date: 07/08/2018 CLINICAL DATA:  Fall with leg pain EXAM: RIGHT FEMUR 2 VIEWS COMPARISON:  None. FINDINGS: There is no evidence of fracture or other focal bone lesions. Soft tissues are unremarkable. IMPRESSION: Negative. Electronically Signed   By: Jasmine Pang M.D.   On: 07/08/2018 20:31   US Abdomen Limited Ruq  Result Date: 07/09/2018 CLINICAL DATA:  Evaluate for cirrhosis. EXAM: ULTRASOUND ABDOMEN LIMITED RIGHT UPPER QUADRANT COMPARISON:  None. FINDINGS: Gallbladder: No gallstones or wall thickening visualized. Two polyps are identified, the largest measuring 6 mm. No sonographic Murphy sign noted by sonographer. Common bile duct: Diameter: 3 mm, normal. Liver: 1.7 x 1.4 x 0.9 cm hyperechoic lesion in the right hepatic lobe. Nodular contour with coarsened parenchymal echogenicity. Portal vein is patent on color Doppler imaging with normal direction of blood flow towards the liver. Trace ascites. IMPRESSION: 1. Findings consistent with cirrhosis. 2. 1.7 cm hyperechoic lesion in the right hepatic lobe. When the patient is clinically stable and able to follow directions and hold their breath (preferably as an outpatient) further evaluation with dedicated abdominal MRI with and without  contrast is recommended. 3. Two gallbladder polyps measuring up to 6 mm. No further follow-up recommended. This recommendation follows ACR consensus guidelines: White Paper of the ACR Incidental Findings Committee II on Gallbladder and Biliary Findings. J Am Coll Radiol 2013:;10:953-956. Electronically Signed   By: Obie Dredge M.D.   On: 07/09/2018 11:41    Medications:  I have reviewed the patient's current medications. Scheduled: . folic acid  1 mg Oral Daily  . levETIRAcetam  500 mg Oral BID  . LORazepam  0-4 mg Intravenous Q6H   Followed by  . [START ON 07/11/2018] LORazepam  0-4 mg Intravenous Q12H  . multivitamin with minerals  1 tablet Oral Daily  . ofloxacin  2 drop Both Eyes QID  . phenytoin  300 mg Oral QHS  . potassium chloride  40 mEq Oral Q4H  . thiamine  100 mg Oral Daily   Or  . thiamine  100 mg Intravenous Daily    Assessment/Plan: No seizure activity noted.  Patient on Dilantin and Keppra.  Dilantin level 21.4.    Recommendations: 1.  Hold Dilantin today.  Restart Dilantin on tomorrow at 200mg  daily.   2.  Continue Keppra at current dose 3.  Continue seizure precautions 4.  Dilantin level in AM   LOS: 2 days   Thana Farr, MD Neurology [phone] 07/10/2018  10:11 AM

## 2018-07-10 NOTE — Progress Notes (Signed)
A&O X 4. Sitting up in bed watching TV. Denies having pain at this time. Map remains > 65. NS @ 125ML/Hr. Total output of 100 ML, dark amber urine. States that he does not need to urinate when asked, urinal at bedside. PT consult ordered per pt request due to prior fall at home, causing back pain.  Soft diet per pt request. States that his teeth hurt while chewing.

## 2018-07-10 NOTE — Progress Notes (Signed)
eLink Physician-Brief Progress Note Patient Name: Trevor Lambert DOB: 09-23-1960 MRN: [id number]   Date of Service  07/10/2018  HPI/Events of Note  Hypokalemia and hypomag  eICU Interventions  Potassium and mag replaced     Intervention Category Intermediate Interventions: Electrolyte abnormality - evaluation and management  Serrina Minogue 07/10/2018, 6:21 AM

## 2018-07-10 NOTE — Progress Notes (Signed)
[facility] [place] [facility]   PATIENT NAME: Trevor Lambert    MR#:  [id number]  DATE OF BIRTH:  [birth year]  SUBJECTIVE:  CHIEF COMPLAINT:   Chief Complaint  Patient presents with  . Abdominal Pain  . Seizures  . Fall   No new complaints this morning.  Patient had to be transferred to the ICU on 07/09/2018 due to persistent hypotension after about 3L of IV fluids.  Patient did not require pressors.  Blood pressure appears fairly stable with most recent blood pressure of 92/67 this morning.    REVIEW OF SYSTEMS:  Review of Systems  Constitutional: Negative for chills and fever.  HENT: Negative for hearing loss and tinnitus.   Eyes: Negative for double vision.       Mucopurulent discharge from the eyes significantly improved  Respiratory: Negative for cough, hemoptysis and shortness of breath.   Cardiovascular: Negative for chest pain.  Gastrointestinal: Negative for heartburn and nausea.  Genitourinary: Negative for dysuria and urgency.  Musculoskeletal: Negative for myalgias and neck pain.  Skin: Negative for rash.  Neurological: Negative for dizziness and headaches. Seizures:    Psychiatric/Behavioral: Negative for depression and hallucinations.    DRUG ALLERGIES:  No Known Allergies VITALS:  Blood pressure 92/67, pulse 89, temperature 98 F (36.7 C), temperature source Oral, resp. rate (!) 22, height 5\' 6"  (1.676 m), weight 61.4 kg, SpO2 100 %. PHYSICAL EXAMINATION:  Constitutional: He is oriented to person, place, and time. He appears well-developed and well-nourished. No distress.  HENT:  Head: Normocephalic and atraumatic.  Mouth/Throat: Oropharynx is clear and moist.  Eyes: Pupils are equal, round, and reactive to light. EOM are normal. No scleral icterus.  Bilateral purulent discharge significantly improved  Neck: Normal range of motion. Neck supple. No JVD present. No thyromegaly present.  Cardiovascular: Normal rate, regular rhythm  and intact distal pulses. Exam reveals no gallop and no friction rub.  No murmur heard. Respiratory: Effort normal and breath sounds normal. No respiratory distress. He has no wheezes. He has no rales.  GI: Soft. Bowel sounds are normal. He exhibits no distension. There is no abdominal tenderness.  Musculoskeletal: Normal range of motion. Neurological: He is alert and oriented to person, place, and time. No cranial nerve deficit.  Skin: Skin is warm and dry. No rash noted. No erythema.  Psychiatric: He has a normal mood and affect. His behavior is normal. Judgment and thought content normal.   LABORATORY PANEL:  Male CBC Recent Labs  Lab 07/10/18 0506  WBC 6.6  HGB 7.8*  HCT 24.5*  PLT 71*   ------------------------------------------------------------------------------------------------------------------ Chemistries  Recent Labs  Lab 07/09/18 0630 07/10/18 0506  NA 134* 135  K 3.6 3.1*  CL 103 110  CO2 23 21*  GLUCOSE 66* 100*  BUN 9 10  CREATININE 0.96 0.76  CALCIUM 7.0* 6.5*  MG  --  1.2*  AST 46*  --   ALT 21  --   ALKPHOS 76  --   BILITOT 0.5  --    RADIOLOGY:  Dg Chest 1 View  Result Date: 07/09/2018 CLINICAL DATA:  Shortness of breath.  Ex-smoker. EXAM: CHEST  1 VIEW COMPARISON:  07/08/2018 FINDINGS: Midline trachea. Normal heart size and mediastinal contours. No pleural effusion or pneumothorax. Developing medial right lower lobe and possible left retrocardiac airspace disease. There is also increased right upper lobe opacity just above the major fissure. IMPRESSION: Developing right and possible left-sided airspace disease. Favor infection or aspiration. Asymmetric  pulmonary edema felt less likely. Electronically Signed   By: Jeronimo Greaves M.D.   On: 07/09/2018 14:50   ASSESSMENT AND PLAN:   1.  Sepsis secondary to probable urinary tract infection.   Patient had to be transferred to ICU on 07/09/2018 due to persistent hypotension after about 30 L of IV fluids.   Patient did not require to be placed on pressors.  Blood pressure remains fairly stable this morning on gentle IV fluids.  No shortness of breath. Patient currently on IV Rocephin with vancomycin for gram-positive coverage pending results of cultures. Being managed by critical care physician.  2.  Acute encephalopathy -due to UTI versus his seizures.  Improved significantly.  Patient awake and alert and oriented this morning.  3.  Seizures Patient presented with breakthrough seizures secondary to noncompliance with meds. Dilantin level slightly elevated at 21.4.  Dilantin held today with plans to resume Dilantin tomorrow at 200 mg daily.  Follow-up on repeat Dilantin level in a.m.  Continue Keppra.  Seizure precautions.  4.  Alcohol abuse -CIWA protocol, alcohol level was negative in the ED, this could potentially have lowered his seizure threshold Continue multivitamin, thiamine and folic acid  5.  Bilateral bacterial conjunctivitis Continue ofloxacin eyedrops  6.  Liver cirrhosis Patient had liver ultrasound done which revealed findings consistent with cirrhosis. 1.7 cm hyperechoic lesion in the right hepatic lobe. When thepatient is clinically stable and able to follow directions and hold their breath (preferably as an outpatient) further evaluation with dedicated abdominal MRI with and without contrast is recommended.  Primary care physician to refer to gastroenterologist as outpatient for further evaluation updated mother at bedside on findings  7.  Thrombocytopenia Most likely secondary to chronic alcohol abuse.  Monitor.  8.  Hypokalemia and hypomagnesemia Being replaced.  Follow-up on repeat levels in a.m.  DVT prophylaxis; SCDs   All the records are reviewed and case discussed with Care Management/Social Worker. Management plans discussed with the patient, family and they are in agreement.  CODE STATUS: Full Code  TOTAL TIME TAKING CARE OF THIS PATIENT: 36 minutes.    More than 50% of the time was spent in counseling/coordination of care: YES  POSSIBLE D/C IN 3 DAYS, DEPENDING ON CLINICAL CONDITION.   Kaelyn Innocent M.D on 07/10/2018 at 1:43 PM  Between 7am to 6pm - Pager - [phone]  After 6pm go to [email or web address] - Social research officer, government  [facility]  Office  [phone]  CC: Primary care physician; Josefine Class, Hanley Hays, MD  Note: This dictation was prepared with Dragon dictation along with smaller phrase technology. Any transcriptional errors that result from this process are unintentional.

## 2018-07-10 NOTE — Progress Notes (Signed)
Initial Nutrition Assessment  DOCUMENTATION CODES:   Not applicable  INTERVENTION:  Will downgrade diet to dysphagia 3 (mechanical soft) with thin liquids in setting of poor dentition and difficulty chewing.  Patient refused to drink any oral nutrition supplements or eat any snacks between meals despite explanation of importance of meeting calorie/protein needs.  Continue MVI daily, thiamine 275 mg daily, folic acid 1 mg daily.  Continue monitoring potassium and magnesium and replacing as needed. Also recommend checking a phosphorus level as patient is at risk for refeeding syndrome in setting of EtOH abuse.  NUTRITION DIAGNOSIS:   Increased nutrient needs related to catabolic illness(cirrhosis) as evidenced by estimated needs.  GOAL:   Patient will meet greater than or equal to 90% of their needs  MONITOR:   PO intake, Labs, Weight trends, I & O's  REASON FOR ASSESSMENT:   Malnutrition Screening Tool    ASSESSMENT:   58 year old male with PMhx of seizures, EtOH abuse, cirrhosis admitted with sepsis secondary to probable UTI, acute encephalopathy.   Met with patient at bedside. He does not seem to be the best historian regarding his nutrition and weight history. Patient reports his appetite is "fine" but then reported he has not had any breakfast today and did not have anything to eat last night either. He is unsure how he was eating PTA. He is unable to describe how many meals he eats per day. Only reports that he occasionally has pizza or soft foods. Unsure how regular his intake is. Noted poor dentition. Patient reports difficulty with chewing, which limits the foods he can eat. Patient refuses to drink any ONS or eat any snacks between meals. He is amenable to trying a mechanical soft diet to see if it can improve intake/tolerance.  Patient is unsure of weight history. He believes his UBW was 160 lbs but he is unsure the last time he weighed that. He is currently 61.4 kg  (135.36 lbs).   Medications reviewed and include: folic acid 1 mg daily, Keppra, Ativan per CIWA, MVI daily, potassium chloride 40 mEq Q4hrs, thiamine 100 mg daily, NS @ 125 mL/hr, ceftriaxone, magnesium sulfate 4 grams IV once today, vancomycin.  Labs reviewed: CBG 87, Potassium 3.1, CO2 21, Anion gap 4, Magnesium 1.2.  Patient is at risk for malnutrition.  NUTRITION - FOCUSED PHYSICAL EXAM:    Most Recent Value  Orbital Region  No depletion  Upper Arm Region  Unable to assess  Thoracic and Lumbar Region  No depletion  Buccal Region  No depletion  Temple Region  Mild depletion  Clavicle Bone Region  No depletion  Clavicle and Acromion Bone Region  Mild depletion  Scapular Bone Region  No depletion  Dorsal Hand  No depletion  Patellar Region  Mild depletion  Anterior Thigh Region  Mild depletion  Posterior Calf Region  Moderate depletion  Edema (RD Assessment)  None  Hair  Reviewed  Eyes  Reviewed  Mouth  Reviewed [poor dentition]  Skin  Reviewed  Nails  Reviewed     Diet Order:   Diet Order            Diet Heart Room service appropriate? Yes; Fluid consistency: Thin  Diet effective now             EDUCATION NEEDS:   Not appropriate for education at this time  Skin:  Skin Assessment: Skin Integrity Issues:(laceration left eye)  Last BM:  07/10/2018 - medium type 6  Height:   Ht  Readings from Last 1 Encounters:  07/09/18 '5\' 6"'  (1.676 m)   Weight:   Wt Readings from Last 1 Encounters:  07/09/18 61.4 kg   Ideal Body Weight:  64.5 kg  BMI:  Body mass index is 21.85 kg/m.  Estimated Nutritional Needs:   Kcal:  1655-3748 (MSJ x 1.2-1.4)  Protein:  75-85 grams (1.2-1.4 grams/kg)  Fluid:  1.5-1.8 L/day (25-30 mL/kg)  Willey Blade, MS, RD, LDN Office: [phone] Pager: [phone] After Hours/Weekend Pager: [phone]

## 2018-07-10 NOTE — Progress Notes (Signed)
Pt resting. A&O on assessment. BP 84/51 map of 60. Denies pain at this time. Will continue to monitor.

## 2018-07-10 NOTE — Progress Notes (Signed)
Patient was transferred to ICU/SDU yesterday evening.  He was transiently hypotensive.  His chest x-ray reveals mild bilateral infiltrates.  In the setting of acute seizure, this most likely represents aspiration pneumonia.  Presently, he is comfortable on room air.  He no longer requires ICU or stepdown level of care.  I have changed his antibiotics to Augmentin.  I have placed order for transfer to MedSurg floor.  After transfer, PCCM will sign off. Please call if we can be of further assistance.  Billy Fischer, MD PCCM service Mobile [phone] Pager [phone] 07/10/2018 1:52 PM

## 2018-07-10 NOTE — Consult Note (Signed)
Pharmacy Antibiotic Note  Trevor Lambert is a 58 y.o. male admitted on 07/08/2018 with sepsis.  Pharmacy has been consulted for Vancomycin dosing. Patient is also receiving Ceftriaxone.   Plan: Vancomycin 1750mg  IV x LD given 2/22.  Continue Vancomycin 750 mg IV Q 12 hrs. Goal AUC 400-550. Expected AUC: 431 SCr used: 0.8  Height: 5\' 6"  (167.6 cm) Weight: 135 lb 5.8 oz (61.4 kg) IBW/kg (Calculated) : 63.8  Temp (24hrs), Avg:98.6 F (37 C), Min:98.5 F (36.9 C), Max:98.8 F (37.1 C)  Recent Labs  Lab 07/08/18 1914 07/08/18 2201 07/09/18 0630 07/10/18 0506  WBC 16.0*  --  9.2 6.6  CREATININE 1.02  --  0.96 0.76  LATICACIDVEN  --  0.7  --   --     Estimated Creatinine Clearance: 89.5 mL/min (by C-G formula based on SCr of 0.76 mg/dL).    No Known Allergies  Antimicrobials this admission: 2/21 ceftriaxone  >>  2/22 vancomycin >>   Dose adjustments this admission:  Microbiology results: 2/22 UCx: Insignificant growth 2/22 MRSA PCR (-)   Thank you for allowing pharmacy to be a part of this patient's care.  Gardner Candle, PharmD, BCPS Clinical Pharmacist 07/10/2018 10:34 AM

## 2018-07-11 DIAGNOSIS — R1310 Dysphagia, unspecified: Secondary | ICD-10-CM

## 2018-07-11 LAB — BASIC METABOLIC PANEL
ANION GAP: 6 (ref 5–15)
BUN: 7 mg/dL (ref 6–20)
CALCIUM: 7.4 mg/dL — AB (ref 8.9–10.3)
CO2: 19 mmol/L — ABNORMAL LOW (ref 22–32)
Chloride: 110 mmol/L (ref 98–111)
Creatinine, Ser: 0.55 mg/dL — ABNORMAL LOW (ref 0.61–1.24)
GFR calc Af Amer: >60 mL/min (ref >60)
GFR calc non Af Amer: >60 mL/min (ref >60)
Glucose, Bld: 108 mg/dL — ABNORMAL HIGH (ref 70–99)
Potassium: 3.6 mmol/L (ref 3.5–5.1)
SODIUM: 135 mmol/L (ref 135–145)

## 2018-07-11 LAB — CBC
HCT: 27.4 % — ABNORMAL LOW (ref 39.0–52.0)
Hemoglobin: 8.8 g/dL — ABNORMAL LOW (ref 13.0–17.0)
MCH: 28.5 pg (ref 26.0–34.0)
MCHC: 32.1 g/dL (ref 30.0–36.0)
MCV: 88.7 fL (ref 80.0–100.0)
PLATELETS: 98 10*3/uL — AB (ref 150–400)
RBC: 3.09 MIL/uL — ABNORMAL LOW (ref 4.22–5.81)
RDW: 14.6 % (ref 11.5–15.5)
WBC: 6.2 10*3/uL (ref 4.0–10.5)
nRBC: 0 % (ref 0.0–0.2)

## 2018-07-11 LAB — HIV ANTIBODY (ROUTINE TESTING W REFLEX): HIV Screen 4th Generation wRfx: NONREACTIVE

## 2018-07-11 LAB — PHENYTOIN LEVEL, FREE AND TOTAL
PHENYTOIN, TOTAL: 35.3 ug/mL — AB (ref 10.0–20.0)
Phenytoin, Free: 3.7 ug/mL — ABNORMAL HIGH (ref 1.0–2.0)

## 2018-07-11 LAB — MAGNESIUM: Magnesium: 2.1 mg/dL (ref 1.7–2.4)

## 2018-07-11 LAB — PROTIME-INR
INR: 0.9
Prothrombin Time: 11.9 seconds (ref 11.4–15.2)

## 2018-07-11 LAB — PHENYTOIN LEVEL, TOTAL: Phenytoin Lvl: 21.5 ug/mL — ABNORMAL HIGH (ref 10.0–20.0)

## 2018-07-11 MED ORDER — PHENYTOIN SODIUM EXTENDED 100 MG PO CAPS
200.0000 mg | ORAL_CAPSULE | Freq: Every day | ORAL | Status: DC
  Filled 2018-07-11: qty 2

## 2018-07-11 NOTE — Evaluation (Signed)
Physical Therapy Evaluation Patient Details Name: Trevor Lambert MRN: [id number] DOB: [birth year] Today's Date: 07/11/2018   History of Present Illness  Pt is a 58 y.o. male presenting to hospital 07/08/18 with unwitnessed possible seizure Sunday and hit head on wooden TV stand; pt with small L frontal scalp contusion with swelling.  C/o R thigh pain; imaging negative for R femur fx.  Pt admitted with sepsis (tachycardia and leukocytosis), hypotensive, acute enceaphalopathy, seizures (breakthrough secondary medication noncompliance), alcohol abuse, and B bacterial conjunctivitis.  PMH includes daily alcohol, seizures.  Clinical Impression  Prior to hospital admission, pt was independent.  Pt lives alone in 1 level home with 3 steps (no railings) to enter.  Currently pt is SBA semi-supine to sit; min assist to stand from bed but CGA to stand from [facility] and recliner; and CGA to ambulate 45 feet with RW.  Pt declining to ambulate any further d/t c/o R thigh pain and low back pain (nurse present and aware).  Very slight decreased stance time noted R LE during ambulation.  Overall steady with functional mobility using RW.  Pt would benefit from skilled PT to address noted impairments and functional limitations (see below for any additional details).  Upon hospital discharge, recommend pt discharge with HHPT.    Follow Up Recommendations Home health PT    Equipment Recommendations  Rolling walker with 5" wheels    Recommendations for Other Services       Precautions / Restrictions Precautions Precautions: Fall Precaution Comments: Seizure Restrictions Weight Bearing Restrictions: No      Mobility  Bed Mobility Overal bed mobility: Needs Assistance Bed Mobility: Supine to Sit     Supine to sit: Supervision;HOB elevated     General bed mobility comments: mild increased effort to perform on own but no assist required  Transfers Overall transfer level: Needs assistance Equipment used:  Rolling walker (2 wheeled) Transfers: Sit to/from [employer] Sit to Stand: Min guard;Min assist Stand pivot transfers: Min guard       General transfer comment: min assist to stand from bed; CGA to stand from [facility] and recliner; CGA stand step turn bed to [facility] to recliner; initial vc's for walker use  Ambulation/Gait Ambulation/Gait assistance: Min guard Gait Distance (Feet): 45 Feet Assistive device: Rolling walker (2 wheeled)   Gait velocity: decreased   General Gait Details: very slight decreased stance time R LE; mostly step through gait pattern; steady with RW; initial vc's for walker use  Stairs            Wheelchair Mobility    Modified Rankin (Stroke Patients Only)       Balance Overall balance assessment: Needs assistance Sitting-balance support: No upper extremity supported;Feet supported Sitting balance-Leahy Scale: Good Sitting balance - Comments: steady sitting reaching within BOS   Standing balance support: No upper extremity supported Standing balance-Leahy Scale: Good Standing balance comment: steady standing reaching within BOS                             Pertinent Vitals/Pain Pain Assessment: 0-10 Pain Score: 8  Pain Location: R thigh and low back Pain Descriptors / Indicators: Aching;Sore Pain Intervention(s): Limited activity within patient's tolerance;Monitored during session;Premedicated before session;Repositioned  Vitals (HR and O2 on room air) stable and WFL throughout treatment session.    Home Living Family/patient expects to be discharged to:: Private residence Living Arrangements: Alone   Type of Home: House Home Access: Stairs to  enter Entrance Stairs-Rails: None Entrance Stairs-Number of Steps: 3 Home Layout: One level Home Equipment: None      Prior Function Level of Independence: Independent         Comments: Pt reports 2 falls in past 6 months d/t "tripping".     Hand Dominance         Extremity/Trunk Assessment   Upper Extremity Assessment Upper Extremity Assessment: Generalized weakness    Lower Extremity Assessment Lower Extremity Assessment: Generalized weakness    Cervical / Trunk Assessment Cervical / Trunk Assessment: Normal  Communication   Communication: No difficulties  Cognition Arousal/Alertness: Awake/alert Behavior During Therapy: Flat affect Overall Cognitive Status: No family/caregiver present to determine baseline cognitive functioning                                 General Comments: A&O x4 although some confusion noted      General Comments General comments (skin integrity, edema, etc.): L frontal scalp injury noted.  Nursing cleared pt for participation in physical therapy.  Pt agreeable to PT session.  Pt requesting to toilet during session (liquid bowel noted and pt missed urinating in [facility] so floor cleaned up; nurse present and aware).    Exercises     Assessment/Plan    PT Assessment Patient needs continued PT services  PT Problem List Decreased strength;Decreased activity tolerance;Decreased balance;Decreased mobility;Decreased knowledge of use of DME;Decreased knowledge of precautions;Pain       PT Treatment Interventions DME instruction;Gait training;Stair training;Functional mobility training;Therapeutic activities;Therapeutic exercise;Balance training;Patient/family education    PT Goals (Current goals can be found in the Care Plan section)  Acute Rehab PT Goals Patient Stated Goal: to go home PT Goal Formulation: With patient Time For Goal Achievement: 07/25/18 Potential to Achieve Goals: Good    Frequency Min 2X/week   Barriers to discharge        Co-evaluation               AM-PAC PT "6 Clicks" Mobility  Outcome Measure Help needed turning from your back to your side while in a flat bed without using bedrails?: A Little Help needed moving from lying on your back to sitting on the side of  a flat bed without using bedrails?: A Little Help needed moving to and from a bed to a chair (including a wheelchair)?: A Little Help needed standing up from a chair using your arms (e.g., wheelchair or bedside chair)?: A Little Help needed to walk in hospital room?: A Little Help needed climbing 3-5 steps with a railing? : A Little 6 Click Score: 18    End of Session Equipment Utilized During Treatment: Gait belt Activity Tolerance: Patient tolerated treatment well Patient left: in chair;with call bell/phone within reach;with chair alarm set Nurse Communication: Mobility status;Precautions;Other (comment)(Nurse present when pt reporting pain) PT Visit Diagnosis: Other abnormalities of gait and mobility (R26.89);Muscle weakness (generalized) (M62.81);Difficulty in walking, not elsewhere classified (R26.2);Pain Pain - Right/Left: Right Pain - part of body: Leg    Time: 4268-3419 PT Time Calculation (min) (ACUTE ONLY): 28 min   Charges:   PT Evaluation $PT Eval Low Complexity: 1 Low PT Treatments $Therapeutic Activity: 8-22 mins       Hendricks Limes, PT 07/11/18, 3:27 PM [phone]

## 2018-07-11 NOTE — Care Management Important Message (Signed)
Important Message  Patient Details  Name: Trevor Lambert MRN: [id number] Date of Birth: 10/11/1960   Medicare Important Message Given:  Yes    [name] 07/11/2018, 11:47 AM

## 2018-07-11 NOTE — Consult Note (Signed)
Wyline Mood , MD [address], [place], [place], [address] [address], [address], [place], [place], [address] Phone: [phone]  Fax: [phone]  Consultation  Referring Provider:    Dr Enid Baas Primary Care Physician:  Josefine Class Hanley Hays, MD Primary Gastroenterologist:  None          Reason for Consultation:     Dysphagia  Date of Admission:  07/08/2018 Date of Consultation:  07/11/2018         HPI:   Trevor Lambert is a 58 y.o. male  Admitted with sepsis secondary to UTI, transferred to the ICU on 07/09/2018 due to hypotension.  Has a history of alcohol abuse.  Incidentally found to have cirrhosis of the liver with a 1.7 cm hyperechoic lesion in the right hepatic lobe.  I have been consulted for dysphagia.  He is being treated for an aspiration pneumonia.  On Augmentin.  Patient has been reporting tooth pain in the left side of his jaw.  Chest x-ray on 07/09/2018 shows right and possible left-sided airspace disease favors infection or aspiration.  He says for thepast few weeks has difficulty swallowing sausage and eggs, liquids go down fine. Says it gets stuck in his neck, denies a similar issue in the past    Past Medical History:  Diagnosis Date  . Seizures (HCC)     Past Surgical History:  Procedure Laterality Date  . NO PAST SURGERIES      Prior to Admission medications   Medication Sig Start Date End Date Taking? Authorizing Provider  DILANTIN 100 MG ER capsule Take 300 mg by mouth at bedtime. 06/29/18  Yes [provider]  levETIRAcetam (KEPPRA) 500 MG tablet Take 500 mg by mouth. 07/08/18  Yes [provider]  simvastatin (ZOCOR) 20 MG tablet Take 20 mg by mouth daily. 07/03/18   [provider]    History reviewed. No pertinent family history.   Social History   Tobacco Use  . Smoking status: Current Every Day Smoker  . Smokeless tobacco: Never Used  Substance Use Topics  . Alcohol use: Yes  . Drug use: Not on file     Allergies as of 07/08/2018  . (No Known Allergies)    Review of Systems:    All systems reviewed and negative except where noted in HPI.   Physical Exam:  Vital signs in last 24 hours: Temp:  [97.4 F (36.3 C)-98.2 F (36.8 C)] 97.5 F (36.4 C) (02/24 0540) Pulse Rate:  [78-89] 82 (02/24 0540) Resp:  [16-26] 16 (02/24 0540) BP: (80-116)/(49-86) 95/67 (02/24 0540) SpO2:  [98 %-100 %] 98 % (02/24 0540) Weight:  [66.3 kg] 66.3 kg (02/23 2259) Last BM Date: 07/09/18 General:   Pleasant, cooperative in NAD Head:  Normocephalic and atraumatic. Eyes:   No icterus.   Conjunctiva pink. PERRLA. Ears:  Normal auditory acuity. Neck:  Supple; no masses or thyroidomegaly Lungs: Respirations even and unlabored. Lungs clear to auscultation bilaterally.   No wheezes, crackles, or rhonchi.  Heart:  Regular rate and rhythm;  Without murmur, clicks, rubs or gallops Abdomen:  Soft, nondistended, nontender. Normal bowel sounds. No appreciable masses or hepatomegaly.  No rebound or guarding.  Neurologic:  Alert and oriented x3;  grossly normal neurologically. Skin:  Intact without significant lesions or rashes. Cervical Nodes:  No significant cervical adenopathy. Psych:  Alert and cooperative. Normal affect.  LAB RESULTS: Recent Labs    07/09/18 0630 07/10/18 0506 07/11/18 0344  WBC 9.2 6.6 6.2  HGB 8.1* 7.8* 8.8*  HCT 25.2* 24.5* 27.4*  PLT 80* 71* 98*   BMET Recent Labs    07/09/18 0630 07/10/18 0506 07/11/18 0344  NA 134* 135 135  K 3.6 3.1* 3.6  CL 103 110 110  CO2 23 21* 19*  GLUCOSE 66* 100* 108*  BUN 9 10 7   CREATININE 0.96 0.76 0.55*  CALCIUM 7.0* 6.5* 7.4*   LFT Recent Labs    07/09/18 0630  PROT 4.9*  ALBUMIN 2.0*  AST 46*  ALT 21  ALKPHOS 76  BILITOT 0.5   PT/INR No results for input(s): LABPROT, INR in the last 72 hours.  STUDIES: Dg Chest 1 View  Result Date: 07/09/2018 CLINICAL DATA:  Shortness of breath.  Ex-smoker. EXAM: CHEST  1 VIEW  COMPARISON:  07/08/2018 FINDINGS: Midline trachea. Normal heart size and mediastinal contours. No pleural effusion or pneumothorax. Developing medial right lower lobe and possible left retrocardiac airspace disease. There is also increased right upper lobe opacity just above the major fissure. IMPRESSION: Developing right and possible left-sided airspace disease. Favor infection or aspiration. Asymmetric pulmonary edema felt less likely. Electronically Signed   By: Jeronimo Greaves M.D.   On: 07/09/2018 14:50      Impression / Plan:   Trevor Lambert is a 58 y.o. y/o male admitted with urosepsis, transferred to the ICU for hypotension and very likely due to aspiration pneumonia.  On antibiotics.  I have been called for evaluation of dysphagia.  Incidentally on right upper quadrant ultrasound a 1.7 cm hyperechoic lesion is seen in the right hepatic lobe in the setting of features suggestive of cirrhosis.  Albumin is 2.0 which may be low secondary to acute inflammation as it is negative acute phase reactant.  Platelet count is low likely due to portal hypertension.  Plan 1.  In the setting of aspiration pneumonia I would not recommend an EGD at this point of time unless urgent.  To evaluate dysphagia.  Suggest a barium swallow with a tablet.  If normal we could proceed with a modified barium swallow to rule out oropharyngeal dysphagia subsequently.  2.  Once the aspiration pneumonia has resolved as an outpatient can consider to perform an upper endoscopy to not only evaluate the dysphagia further but also to rule out esophageal varices as he has a new diagnosis of cirrhosis of the liver.  3.  When permissible will require MRI liver mass protocol to determine if the lesion seen on the ultrasound is hepatocellular carcinoma.  4.  As an outpatient he would require liver cirrhosis work-up.  5.  I would also suggest checking the INR.  Thank you for involving me in the care of this patient.      LOS: 3 days    Wyline Mood, MD  07/11/2018, 10:03 AM

## 2018-07-11 NOTE — Progress Notes (Addendum)
Subjective: Patient awake, alert and oriented x4.  He is noted to be sitting up in the bed watching TV.  No reports of new seizures or seizure-like activity overnight.  Denies any concerns this morning.  Objective: Current vital signs: BP 95/67 (BP Location: Left Arm)   Pulse 82   Temp (!) 97.5 F (36.4 C) (Oral)   Resp 16   Ht  (1.676 m)   Wt 66.3 kg   SpO2 98%   BMI 23.58 kg/m  Vital signs in last 24 hours: Temp:  [97.4 F (36.3 C)-98.2 F (36.8 C)] 97.5 F (36.4 C) (02/24 0540) Pulse Rate:  [78-89] 82 (02/24 0540) Resp:  [16-26] 16 (02/24 0540) BP: (80-116)/(49-86) 95/67 (02/24 0540) SpO2:  [98 %-100 %] 98 % (02/24 0540) Weight:  [66.3 kg] 66.3 kg (02/23 2259)  Intake/Output from previous day: 02/23 0701 - 02/24 0700 In: 1819 [P.O.:600; I.V.:1219] Out: 375 [Urine:375] Intake/Output this shift: Total I/O In: -  Out: 300 [Urine:100; Emesis/NG output:200] Nutritional status:  Diet Order            DIET DYS 3 Room service appropriate? Yes with Assist; Fluid consistency: Thin  Diet effective now             Neurological Exam   Mental Status: Alert, oriented, thought content appropriate.  Speech fluent without evidence of aphasia.  Able to follow 3 step commands without difficulty. Attention span and concentration seemed appropriate  Cranial Nerves: II: Discs flat bilaterally; Visual fields grossly normal, pupils equal, round, reactive to light and accommodation III,IV, VI: ptosis not present, extra-ocular motions intact bilaterally V,VII: smile symmetric, facial light touch sensation intact VIII: hearing normal bilaterally IX,X: gag reflex present XI: bilateral shoulder shrug XII: midline tongue extension Motor: Right :  Upper extremity   5/5 Without pronator drift      Left: Upper extremity   5/5 without pronator drift Right:   Lower extremity   5/5                                          Left: Lower extremity   5/5 Tone and bulk:normal tone throughout;  no atrophy noted Sensory: Pinprick and light touch intact bilaterally Deep Tendon Reflexes: 2+ and symmetric throughout Plantars: Right: mute                              Left: mute Cerebellar: Finger-to-nose testing intact bilaterally. Heel to shin testing normal bilaterally Gait: not tested due to safety concerns  Lab Results: Basic Metabolic Panel: Recent Labs  Lab 07/08/18 1914 07/09/18 0630 07/10/18 0506 07/11/18 0344  NA 135 134* 135 135  K 3.1* 3.6 3.1* 3.6  CL 98 103 110 110  CO2 26 23 21* 19*  GLUCOSE 68* 66* 100* 108*  BUN CREATININE 1.02 0.96 0.76 0.55*  CALCIUM 8.0* 7.0* 6.5* 7.4*  MG  --   --  1.2* 2.1    Liver Function Tests: Recent Labs  Lab 07/08/18 1914 07/09/18 0630  AST 40 46*  ALT 22 21  ALKPHOS 104 76  BILITOT 0.8 0.5  PROT 5.9* 4.9*  ALBUMIN 2.6* 2.0*   Recent Labs  Lab 07/08/18 1914  LIPASE 16   No results for input(s): AMMONIA in the last 168 hours.  CBC: Recent Labs  Lab  07/08/18 1914 07/09/18 0630 07/10/18 0506 07/11/18 0344  WBC 16.0* 9.2 6.6 6.2  HGB 10.0* 8.1* 7.8* 8.8*  HCT 31.0* 25.2* 24.5* 27.4*  MCV 89.1 89.0 91.4 88.7  PLT 81* 80* 71* 98*    Cardiac Enzymes: Recent Labs  Lab 07/08/18 1914  CKTOTAL 616*  TROPONINI <0.03    Lipid Panel: No results for input(s): CHOL, TRIG, HDL, CHOLHDL, VLDL, LDLCALC in the last 168 hours.  CBG: Recent Labs  Lab 07/08/18 1926 07/09/18 1205 07/09/18 1631  GLUCAP 57* 87 85    Microbiology: Results for orders placed or performed during the hospital encounter of 07/08/18  Urine culture     Status: Abnormal   Collection Time: 07/08/18  7:14 PM  Result Value Ref Range Status   Specimen Description   Final    URINE, CLEAN CATCH Performed at [facility], [address], [place], [place] [address]    Special Requests   Final    NONE Performed at [facility], [address], [place], [place] [address]    Culture (A)  Final    <10,000  COLONIES/mL INSIGNIFICANT GROWTH Performed at [facility], [address] [place], [place] [address]    Report Status 07/10/2018 FINAL  Final  MRSA PCR Screening     Status: None   Collection Time: 07/09/18  5:47 PM  Result Value Ref Range Status   MRSA by PCR NEGATIVE NEGATIVE Final    Comment:        The GeneXpert MRSA Assay (FDA approved for NASAL specimens only), is one component of a comprehensive MRSA colonization surveillance program. It is not intended to diagnose MRSA infection nor to guide or monitor treatment for MRSA infections. Performed at [facility], [address] [place], [place] [address]     Coagulation Studies: No results for input(s): LABPROT, INR in the last 72 hours.  Imaging: Dg Chest 1 View  Result Date: 07/09/2018 CLINICAL DATA:  Shortness of breath.  Ex-smoker. EXAM: CHEST  1 VIEW COMPARISON:  07/08/2018 FINDINGS: Midline trachea. Normal heart size and mediastinal contours. No pleural effusion or pneumothorax. Developing medial right lower lobe and possible left retrocardiac airspace disease. There is also increased right upper lobe opacity just above the major fissure. IMPRESSION: Developing right and possible left-sided airspace disease. Favor infection or aspiration. Asymmetric pulmonary edema felt less likely. Electronically Signed   By: Jeronimo Greaves M.D.   On: 07/09/2018 14:50    Medications:  I have reviewed the patient's current medications. Prior to Admission:  Medications Prior to Admission  Medication Sig Dispense Refill Last Dose  . DILANTIN 100 MG ER capsule Take 300 mg by mouth at bedtime.   07/07/2018 at 1300  . levETIRAcetam (KEPPRA) 500 MG tablet Take 500 mg by mouth.   Past Month at Unknown time  . simvastatin (ZOCOR) 20 MG tablet Take 20 mg by mouth daily.      Scheduled: . amoxicillin-clavulanate  1 tablet Oral Q12H  . folic acid  1 mg Oral Daily  . levETIRAcetam  500 mg Oral BID  . LORazepam  0-4 mg  Intravenous Q12H  . multivitamin with minerals  1 tablet Oral Daily  . nicotine  21 mg Transdermal Daily  . ofloxacin  2 drop Both Eyes QID  . phenytoin  200 mg Oral QHS  . thiamine  100 mg Oral Daily   Or  . thiamine  100 mg Intravenous Daily    Assessment: 58 year old male with  history of alcohol dependence and seizures noncompliant with medication presented with altered mental status and breakthrough seizures.  Apparently patient has been noncompliant with his medication Keppra for the past 2 weeks.  Had a breakthrough seizures resulting in a traumatic fall.  Patient found to be hypotensive with BP 66/46 and septic due to aspiration pneumonia.  Patient was restarted on his home medications.  Has had no further seizures since admission.  Dilantin levels were noted to be elevated at 21.4 therefore Dilantin was held yesterday.  Dilantin levels this morning 21.5.  Plan: 1. Hold Dilantin today.  Restart Dilantin once level therapeutic at 200 mg daily 2. Continue Keppra at current dose 3. Seizure precautions 4. Ativan prn seizure activity 5. Check Dilantin level a.m.  6.  Medication compliance counseling 7. Patient unable to drive, operate heavy machinery, perform activities at heights and participate in water activities until release by outpatient physician.  This patient was staffed with Dr. Verlon Au, Thad Ranger who personally evaluated patient, reviewed documentation and agreed with assessment and plan of care as above.  Webb Silversmith, DNP, FNP-BC Board certified Nurse Practitioner Neurology Department    LOS: 3 days   07/11/2018  10:34 AM  Thana Farr, MD Neurology [phone]

## 2018-07-11 NOTE — Evaluation (Signed)
Clinical/Bedside Swallow Evaluation Patient Details  Name: Trevor Lambert MRN: [id number] Date of Birth: 05-31-1960  Today's Date: 07/11/2018 Time: SLP Start Time (ACUTE ONLY): 0850 SLP Stop Time (ACUTE ONLY): 0925 SLP Time Calculation (min) (ACUTE ONLY): 35 min  Past Medical History:  Past Medical History:  Diagnosis Date  . Seizures (HCC)    Past Surgical History:  Past Surgical History:  Procedure Laterality Date  . NO PAST SURGERIES     HPI:  Per admitting H&P: Trevor Lambert  is a 58 y.o. male who presents with chief complaint as above.  Patient presents to the ED brought by family after having falls at home.  He has a healing laceration on his face from where he fell.  Evaluation here in the ED shows UA mildly suspicious for UTI, the patient denies any symptoms of UTI.  He does have a history of seizure disorder since childhood, and recently states he has not been taking all of his seizure medications.  He also has a significant alcohol use history, but over the last couple of days has not been drinking as much.  Of note, patient also has significant purulent drainage from both eyes suspicious for bacterial conjunctivitis.  Hospitalist were called for admission and further evaluation   Assessment / Plan / Recommendation Clinical Impression  This 58 y/o male appears to present with functional swallowing abilities at bedside. No overt s/s aspiration observed with any consistency tested (thin liquid, puree,solid). However it should be noted this was a very brief assessment due to limited number of PO trials per pt repeated request and statement that he has "had enough." Oral phase WFL. Oral mech revealed mild generalized weakness of all structures, missing dentition, poor quality of remaining dentition. Pt also reports significant tooth pain on lower L side of his jaw and stated he needs to have a tooth extracted for some time. Notified nsg of significant L tooth pain.  However despite poor  dentition, adequate oral prep/coordination and A-P transit time with limited trials observed. No oral residue observed. No overt s/s pharyngeal dysphagia observed. Swallow initiation appeared timely. Vocal quality remained clear thoughout evaluation. However it is SIGNIFICANT to note pt is positive for s/s possible esophageal dysphagia c/b consistent mild BELCHING following sips of thin liquid, pt also reports food/drink sometimes "comes back up" on him during meals. Rec. GI consult to f/u and investigate possible esophageal dysphagia which could result in aspiration after the swallow.  Educated pt re: aspiration and reflux precautions and general safe swallow recommendations. Pt stated agreement. Recommend continue with current Dysphagia III diet with thin liquids, may continue to give meds whole with thin liquid. Discussed results of evaluation with nursing, nursing in agreement. SLP to f/u with toleration of diet and sign off if pt presents with no further needs identified. SLP Visit Diagnosis: Dysphagia, unspecified (R13.10)    Aspiration Risk  Mild aspiration risk;Risk for inadequate nutrition/hydration    Diet Recommendation Dysphagia 3 (Mech soft);Thin liquid   Liquid Administration via: Cup;Straw Medication Administration: Whole meds with liquid Supervision: Patient able to self feed Compensations: Minimize environmental distractions;Slow rate;Small sips/bites Postural Changes: Seated upright at 90 degrees;Remain upright for at least 30 minutes after po intake    Other  Recommendations Recommended Consults: Consider GI evaluation Oral Care Recommendations: Oral care QID   Follow up Recommendations None      Frequency and Duration min 1 x/week  1 week       Prognosis Prognosis for Safe Diet Advancement: Good  Barriers to Reach Goals: Motivation      Swallow Study   General Date of Onset: 07/11/18 HPI: Per admitting H&P: Trevor Lambert  is a 58 y.o. male who presents with chief  complaint as above.  Patient presents to the ED brought by family after having falls at home.  He has a healing laceration on his face from where he fell.  Evaluation here in the ED shows UA mildly suspicious for UTI, the patient denies any symptoms of UTI.  He does have a history of seizure disorder since childhood, and recently states he has not been taking all of his seizure medications.  He also has a significant alcohol use history, but over the last couple of days has not been drinking as much.  Of note, patient also has significant purulent drainage from both eyes suspicious for bacterial conjunctivitis.  Hospitalist were called for admission and further evaluation Type of Study: Bedside Swallow Evaluation Diet Prior to this Study: Dysphagia 3 (soft);Thin liquids Temperature Spikes Noted: No Respiratory Status: Room air History of Recent Intubation: No Behavior/Cognition: Alert;Other (Comment)(required consistent encouragement to participate) Oral Cavity Assessment: Erythema;Dry Oral Care Completed by SLP: No Oral Cavity - Dentition: Poor condition;Missing dentition Vision: Functional for self-feeding Self-Feeding Abilities: Able to feed self Patient Positioning: Upright in bed Baseline Vocal Quality: Normal Volitional Cough: Congested Volitional Swallow: Able to elicit    Oral/Motor/Sensory Function Overall Oral Motor/Sensory Function: Generalized oral weakness   Ice Chips Ice chips: Within functional limits Presentation: Spoon   Thin Liquid Thin Liquid: Within functional limits Presentation: Cup;Self Fed;Straw    Nectar Thick Nectar Thick Liquid: Not tested   Honey Thick Honey Thick Liquid: Not tested   Puree Puree: Within functional limits Presentation: Spoon   Solid     Solid: Within functional limits Presentation: Self Fed;Spoon      Jullie Arps, MA, CCC-SLP 07/11/2018,9:27 AM

## 2018-07-11 NOTE — Progress Notes (Addendum)
[facility]   PATIENT NAME: Trevor Lambert    MR#:  [id number]  DATE OF BIRTH:  Jan 27, 1961  SUBJECTIVE:  CHIEF COMPLAINT:   Chief Complaint  Patient presents with  . Abdominal Pain  . Seizures  . Fall   No fevers overnight.  No seizures.  Patient seen by speech therapist this morning.  Recommended GI consultation for further evaluation for esophageal dysphagia.  GI consult placed.  REVIEW OF SYSTEMS:  Review of Systems  Constitutional: Negative for chills and fever.  HENT: Negative for hearing loss and tinnitus.   Eyes: Negative for blurred vision and double vision.  Respiratory: Negative for cough, hemoptysis and shortness of breath.   Cardiovascular: Negative for chest pain.  Gastrointestinal: Negative for heartburn and nausea.  Genitourinary: Negative for dysuria and urgency.  Musculoskeletal: Negative for myalgias and neck pain.  Skin: Negative for rash.  Neurological: Negative for dizziness and headaches. Seizures:    Psychiatric/Behavioral: Negative for depression and hallucinations.    DRUG ALLERGIES:  No Known Allergies VITALS:  Blood pressure 95/67, pulse 82, temperature (!) 97.5 F (36.4 C), temperature source Oral, resp. rate 16, height 5\' 6"  (1.676 m), weight 66.3 kg, SpO2 98 %. PHYSICAL EXAMINATION:  Constitutional: He is oriented to person, place, and time. He appears well-developed and well-nourished. No distress.  HENT:  Head: Normocephalic and atraumatic.  Mouth/Throat: Oropharynx is clear and moist.  Eyes: Pupils are equal, round, and reactive to light. EOM are normal. No scleral icterus.  Neck: Normal range of motion. Neck supple. No JVD present. No thyromegaly present.  Cardiovascular: Normal rate, regular rhythm and intact distal pulses. Exam reveals no gallop and no friction rub.  No murmur heard. Respiratory: Effort normal and breath sounds normal. No respiratory distress. He has no wheezes. He has no  rales.  GI: Soft. Bowel sounds are normal. He exhibits no distension. There is no abdominal tenderness.  Musculoskeletal: Normal range of motion. Neurological: He is alert and oriented to person, place, and time. No cranial nerve deficit.  Skin: Skin is warm and dry. No rash noted. No erythema.  Psychiatric: He has a normal mood and affect. His behavior is normal. Judgment and thought content normal.   LABORATORY PANEL:  Male CBC Recent Labs  Lab 07/11/18 0344  WBC 6.2  HGB 8.8*  HCT 27.4*  PLT 98*   ------------------------------------------------------------------------------------------------------------------ Chemistries  Recent Labs  Lab 07/09/18 0630  07/11/18 0344  NA 134*   < > 135  K 3.6   < > 3.6  CL 103   < > 110  CO2 23   < > 19*  GLUCOSE 66*   < > 108*  BUN 9   < > 7  CREATININE 0.96   < > 0.55*  CALCIUM 7.0*   < > 7.4*  MG  --    < > 2.1  AST 46*  --   --   ALT 21  --   --   ALKPHOS 76  --   --   BILITOT 0.5  --   --    < > = values in this interval not displayed.   RADIOLOGY:  No results found. ASSESSMENT AND PLAN:   1.  Sepsis secondary to probable urinary tract infection and aspiration pneumonia likely related to being postictal from seizures.  Most likely present on admission. Initial chest x-ray was negative but follow-up chest x-ray during this admission revealed bilateral airspace disease concerning for aspiration.  Patient had to be transferred to ICU on 07/09/2018 due to persistent hypotension after about 3 L of IV fluids.  Patient did not require to be placed on pressors.  Blood pressure remains fairly stable on gentle IV fluids. Patient previously treated with IV Rocephin with vancomycin Patient was managed in the ICU by critical care physician.  Antibiotics already changed to p.o. Augmentin to cover for aspiration pneumonia.   Urine culture with insignificant growth.    2.  Acute encephalopathy -due to UTI versus his seizures.  Improved  significantly.  Patient awake and alert and oriented this morning.  3.  Seizures Patient presented with breakthrough seizures secondary to noncompliance with meds. Dilantin level slightly elevated at 21.5. Being followed by neurologist.  Recommendation is to hold Dilantin with plans to restart once level therapeutic at 200 mg daily. Follow-up on repeat Dilantin level in a.m.  Continue Keppra.  Seizure precautions. Patient unable to drive, operate heavy machinery, perform activities at heights and participate in water activities until release by outpatient physician  4.  Alcohol abuse -CIWA protocol, alcohol level was negative in the ED, this could potentially have lowered his seizure threshold Continue multivitamin, thiamine and folic acid  5.  Bilateral bacterial conjunctivitis Significantly improved with ofloxacin eyedrops  6.  Liver cirrhosis Patient had liver ultrasound done which revealed findings consistent with cirrhosis. 1.7 cm hyperechoic lesion in the right hepatic lobe. When thepatient is clinically stable and able to follow directions and hold their breath (preferably as an outpatient) further evaluation with dedicated abdominal MRI with and without contrast is recommended.   Follow-up with gastroenterologist  7.  Thrombocytopenia; improving Most likely secondary to chronic alcohol abuse.  Monitor.  8.  Hypokalemia and hypomagnesemia Replaced  9.  Dysphagia Likely contributed to aspiration pneumonia Seen by speech therapist.  Recommended gastroenterology consultation for further evaluation for esophageal dysphagia.  Consult placed for gastroenterologist and Dr. Tobi Bastos to see patient.  DVT prophylaxis; SCDs Avoiding heparin products due to thrombocytopenia  I called patient's sister for update as requested; Ms. Velna Hatchet; [phone].  No response.  Voicemail full.  Later called and updated patient's sister on [phone]   all the records are reviewed and case discussed  with Care Management/Social Worker. Management plans discussed with the patient, family and they are in agreement.  CODE STATUS: Full Code  TOTAL TIME TAKING CARE OF THIS PATIENT: 35 minutes.   More than 50% of the time was spent in counseling/coordination of care: YES  POSSIBLE D/C IN 3 DAYS, DEPENDING ON CLINICAL CONDITION.   Donnia Poplaski M.D on 07/11/2018 at 11:56 AM  Between 7am to 6pm - Pager - [phone]  After 6pm go to [email or web address] - Social research officer, government  [facility]  Office  [phone]  CC: Primary care physician; Josefine Class, Hanley Hays, MD  Note: This dictation was prepared with Dragon dictation along with smaller phrase technology. Any transcriptional errors that result from this process are unintentional.

## 2018-07-12 ENCOUNTER — Inpatient Hospital Stay: Payer: Medicare Other

## 2018-07-12 DIAGNOSIS — K2210 Ulcer of esophagus without bleeding: Secondary | ICD-10-CM

## 2018-07-12 LAB — PHENYTOIN LEVEL, FREE AND TOTAL
Phenytoin, Free: 3.9 ug/mL — ABNORMAL HIGH (ref 1.0–2.0)
Phenytoin, Total: 26.8 ug/mL — ABNORMAL HIGH (ref 10.0–20.0)

## 2018-07-12 LAB — PHENYTOIN LEVEL, TOTAL: Phenytoin Lvl: 17.4 ug/mL (ref 10.0–20.0)

## 2018-07-12 IMAGING — RF DG ESOPHAGUS
5 series · 14 of 24 positions shown · non-contrast
Comparison: None.

CLINICAL DATA: Dysphagia with solids and liquids for 2 months

EXAM:
ESOPHOGRAM / BARIUM SWALLOW / BARIUM TABLET STUDY
TECHNIQUE: Combined double contrast and single contrast examination performed
using effervescent crystals, thick barium liquid, and thin barium
liquid. The patient was observed with fluoroscopy swallowing a 13 mm
barium sulphate tablet.
FLUOROSCOPY TIME:  Fluoroscopy Time:  1 minutes 30 seconds
Radiation Exposure Index (if provided by the fluoroscopic device):
7.5 mGy
Number of Acquired Spot Images: 0

[Series 1: cp_standard · 0.25mm/px · 2 of 62 frames shown (1 of 5)]
[frame 10/62]
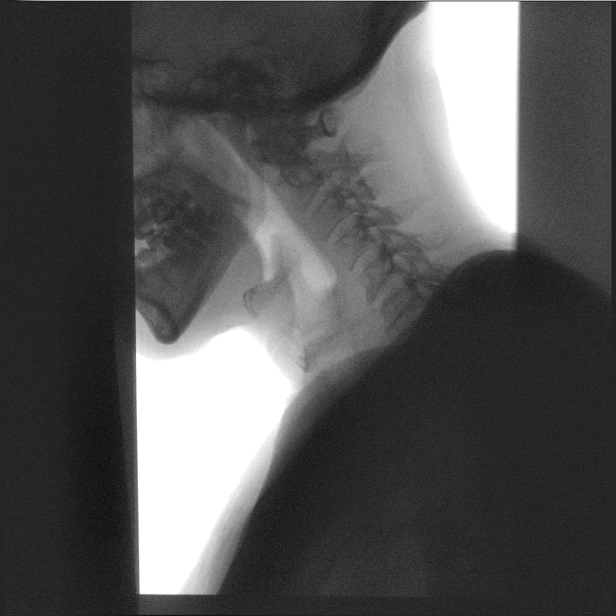
[frame 53/62]
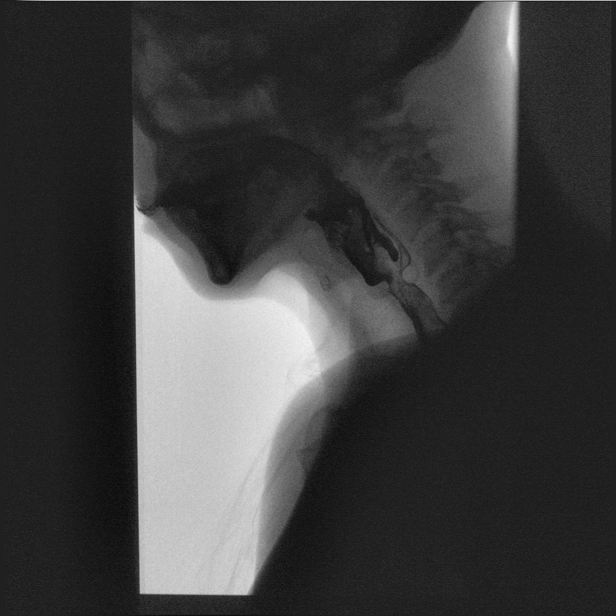

[Series 2: cp_standard · 0.25mm/px · 3 of 79 frames shown (2 of 5)]
[frame 12/79]
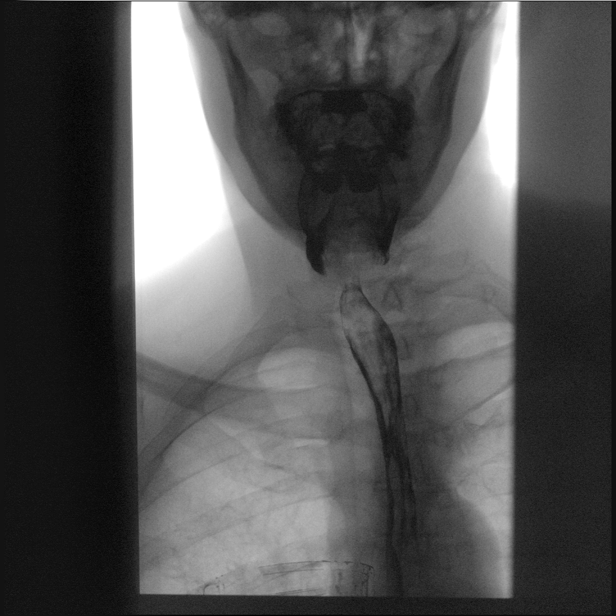
[frame 41/79]
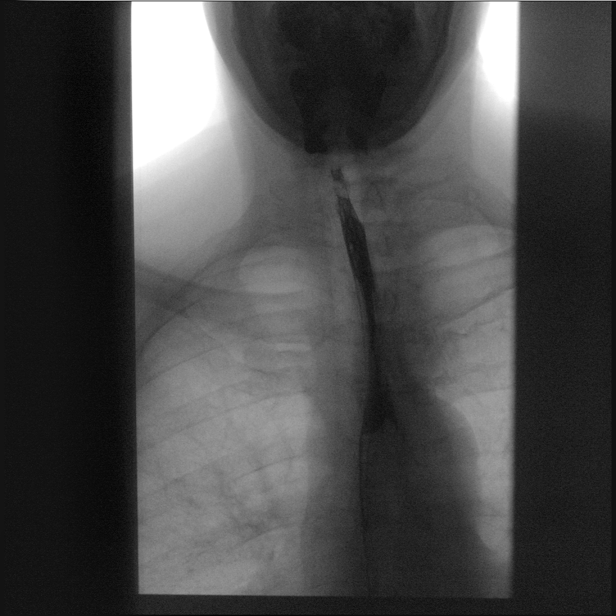
[frame 68/79]
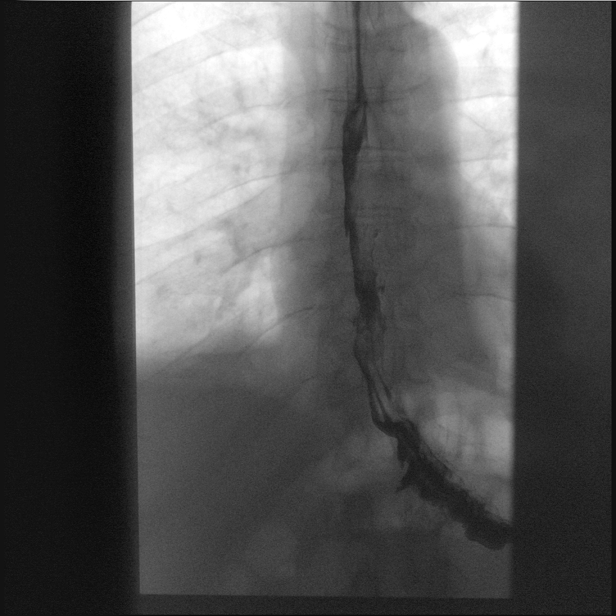

[Series 3: cp_standard · 0.25mm/px · 4 of 8 slices shown (3 of 5)]
[im 2/8]
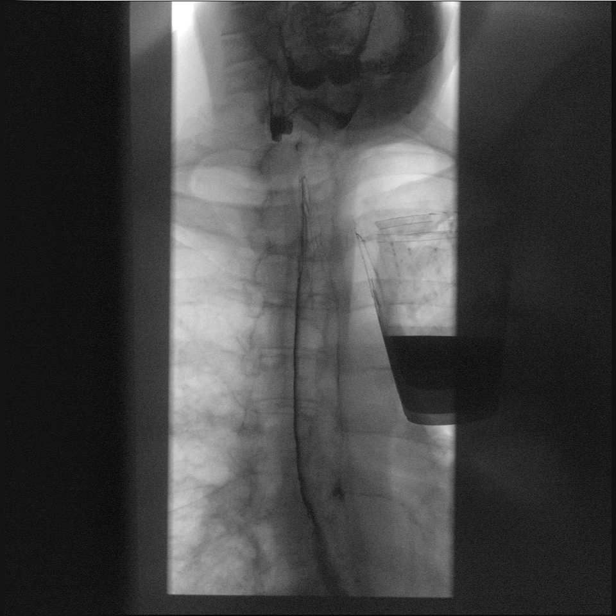
[im 4/8]
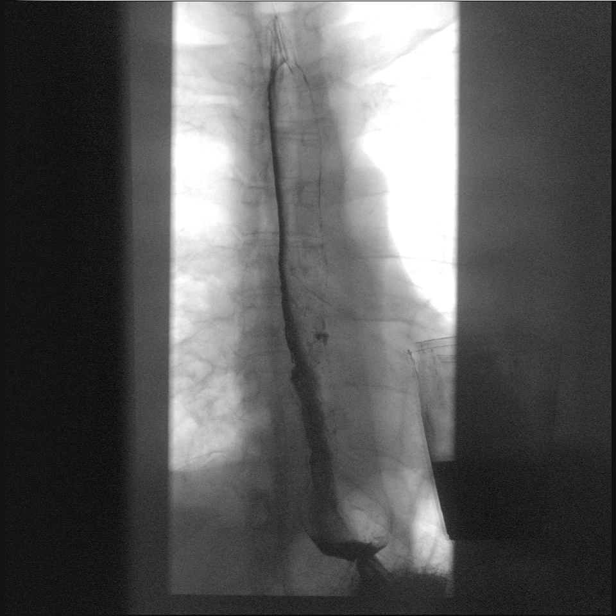
[im 5/8]
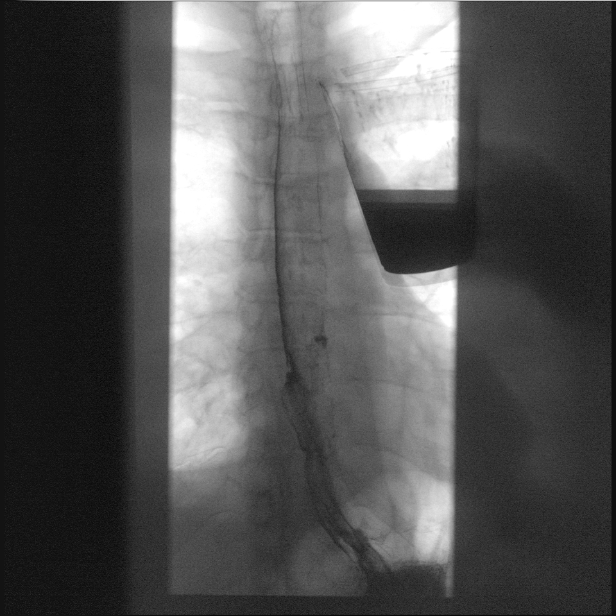
[im 7/8]
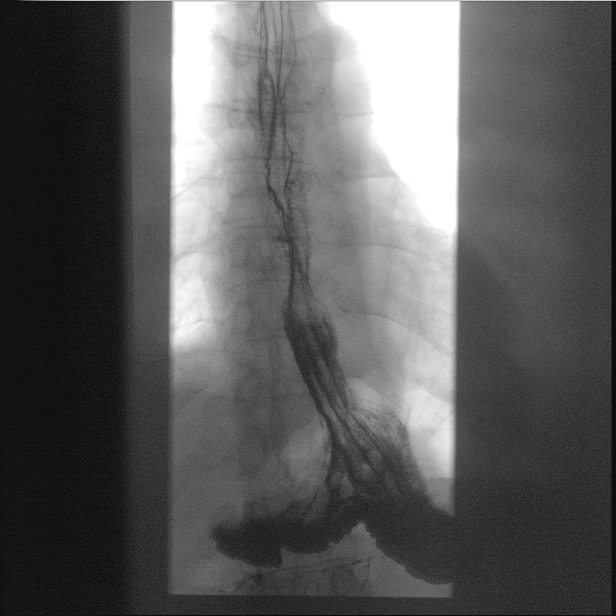

[Series 5: cp_standard · 0.25mm/px · 3 of 82 frames shown (4 of 5)]
[frame 13/82]
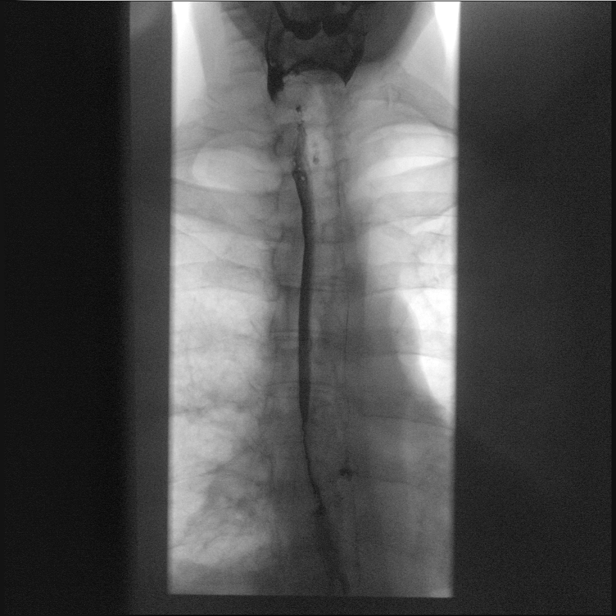
[frame 70/82]
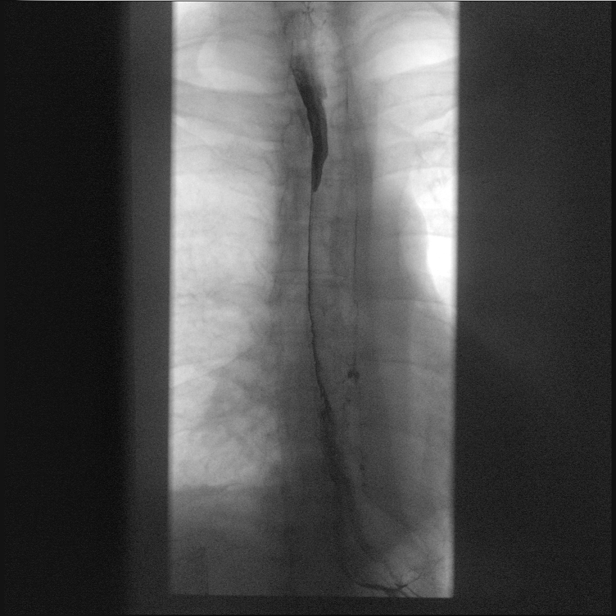
[frame 80/82  full-range]
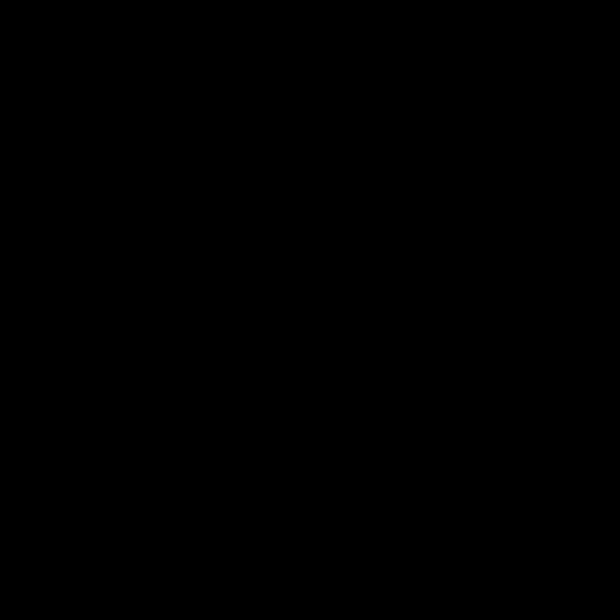

[Series 10: cp_standard · 0.25mm/px · 2 of 32 frames shown (5 of 5)]
[frame 6/32]
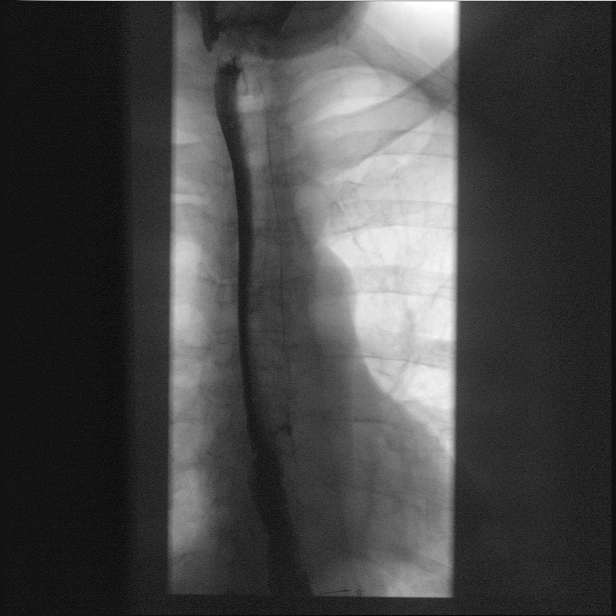
[frame 28/32]
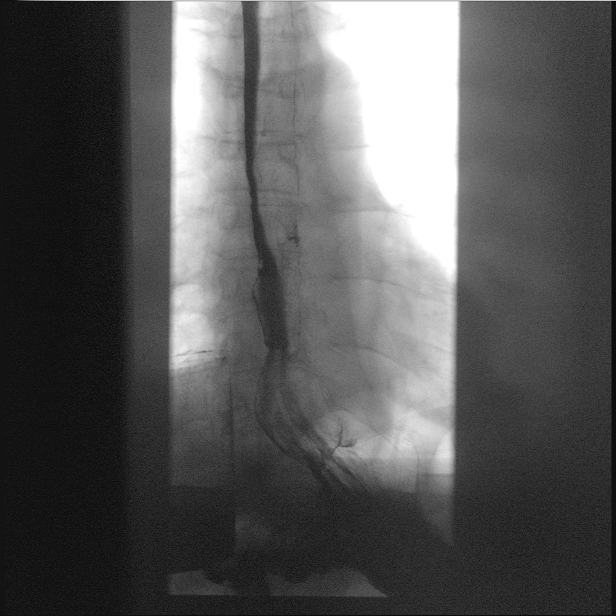

[14 of 24 positions shown; findings below may reference images not displayed]

FINDINGS: There was normal pharyngeal anatomy and motility. Contrast flowed
freely through the esophagus without evidence of stricture or mass.
Mucosal irregularity along the right side of the distal esophagus
with ulceration. Mild thickening of the reticular markings of the
distal esophagus concerning for esophagitis. Esophageal motility was
normal. Moderate gastroesophageal reflux. Small hiatal hernia. No
extraluminal contrast to suggest perforation.

At the end of the examination a 13 mm barium tablet was administered
which transited through the esophagus and esophagogastric junction
without delay.
IMPRESSION: 1. Distal esophageal ulcer along the right lateral aspect of
indeterminate etiology. Underlying esophagitis. Further evaluation
with upper endoscopy may be helpful.
2. Moderate gastroesophageal reflux.
3. No esophageal stricture.

## 2018-07-12 MED ORDER — AMOXICILLIN-POT CLAVULANATE 875-125 MG PO TABS: 1.0000 | ORAL_TABLET | Freq: Two times a day (BID) | ORAL | 0 refills | Status: DC

## 2018-07-12 MED ORDER — OFLOXACIN 0.3 % OP SOLN: 2.0000 [drp] | Freq: Four times a day (QID) | OPHTHALMIC | 0 refills | Status: AC

## 2018-07-12 MED ORDER — NICOTINE 21 MG/24HR TD PT24: 21.0000 mg | MEDICATED_PATCH | Freq: Every day | TRANSDERMAL | 0 refills | Status: DC

## 2018-07-12 MED ORDER — PANTOPRAZOLE SODIUM 40 MG PO TBEC: 40.0000 mg | DELAYED_RELEASE_TABLET | Freq: Two times a day (BID) | ORAL | 0 refills | Status: DC

## 2018-07-12 MED ORDER — PHENYTOIN SODIUM EXTENDED 200 MG PO CAPS: 200.0000 mg | ORAL_CAPSULE | Freq: Every day | ORAL | 0 refills | Status: DC

## 2018-07-12 MED ORDER — GUAIFENESIN-DM 100-10 MG/5ML PO SYRP: 5.0000 mL | ORAL_SOLUTION | ORAL | 0 refills | Status: AC | PRN

## 2018-07-12 NOTE — Progress Notes (Signed)
Trevor Lambert , MD [address], [place], [place], [address] [address], [place], [place], [address] Phone: [phone]  Fax: [phone]   Trevor Lambert is being followed for dysphagia  Day 1 of follow up   Subjective: Able to tolerate po liquids.    Objective: Vital signs in last 24 hours: Vitals:   07/11/18 0540 07/11/18 1528 07/11/18 1937 07/12/18 0412  BP: 95/67 110/82 117/88 103/71  Pulse: 82 80 90 99  Resp: 16 20 18 16   Temp: (!) 97.5 F (36.4 C) 97.8 F (36.6 C) 98.2 F (36.8 C) 98.3 F (36.8 C)  TempSrc: Oral Oral Oral Oral  SpO2: 98% 100% 100% 99%  Weight:      Height:       Weight change:   Intake/Output Summary (Last 24 hours) at 07/12/2018 1027 Last data filed at 07/11/2018 1416 Gross per 24 hour  Intake 240 ml  Output -  Net 240 ml     Exam: Heart:: Regular rate and rhythm, S1S2 present or without murmur or extra heart sounds Lungs: normal, clear to auscultation and clear to auscultation and percussion Abdomen: soft, nontender, normal bowel sounds   Lab Results: @LABTEST2 @ Micro Results: Recent Results (from the past 240 hour(s))  Urine culture     Status: Abnormal   Collection Time: 07/08/18  7:14 PM  Result Value Ref Range Status   Specimen Description   Final    URINE, CLEAN CATCH Performed at [facility], [address], [place], [place] [address]    Special Requests   Final    NONE Performed at [facility], [address] [place], [place] [address]    Culture (A)  Final    <10,000 COLONIES/mL INSIGNIFICANT GROWTH Performed at [facility], [address], [place], [place] [address]    Report Status 07/10/2018 FINAL  Final  MRSA PCR Screening     Status: None   Collection Time: 07/09/18  5:47 PM  Result Value Ref Range Status   MRSA by PCR NEGATIVE NEGATIVE Final    Comment:        The GeneXpert MRSA Assay (FDA approved for NASAL specimens only), is one component of  a comprehensive MRSA colonization surveillance program. It is not intended to diagnose MRSA infection nor to guide or monitor treatment for MRSA infections. Performed at [facility], [address] [place], [place] [address]    Studies/Results: Dg Esophagus W Double Cm (hd)  Result Date: 07/12/2018 CLINICAL DATA:  Dysphagia with solids and liquids for 2 months EXAM: ESOPHOGRAM / BARIUM SWALLOW / BARIUM TABLET STUDY TECHNIQUE: Combined double contrast and single contrast examination performed using effervescent crystals, thick barium liquid, and thin barium liquid. The patient was observed with fluoroscopy swallowing a 13 mm barium sulphate tablet. FLUOROSCOPY TIME:  Fluoroscopy Time:  1 minutes 30 seconds Radiation Exposure Index (if provided by the fluoroscopic device): 7.5 mGy Number of Acquired Spot Images: 0 COMPARISON:  None. FINDINGS: There was normal pharyngeal anatomy and motility. Contrast flowed freely through the esophagus without evidence of stricture or mass. Mucosal irregularity along the right side of the distal esophagus with ulceration. Mild thickening of the reticular markings of the distal esophagus concerning for esophagitis. Esophageal motility was normal. Moderate gastroesophageal reflux. Small hiatal hernia. No extraluminal contrast to suggest perforation. At the end of the examination a 13 mm barium tablet was administered which transited through the esophagus and esophagogastric junction without delay. IMPRESSION: 1.  Distal esophageal ulcer along the right lateral aspect of indeterminate etiology. Underlying esophagitis. Further evaluation with upper endoscopy may be helpful. 2. Moderate gastroesophageal reflux. 3. No esophageal stricture. Electronically Signed   By: Elige Ko   On: 07/12/2018 09:32   Medications: I have reviewed the patient's current medications. Scheduled Meds: . amoxicillin-clavulanate  1 tablet Oral Q12H  . folic acid  1 mg Oral Daily  .  levETIRAcetam  500 mg Oral BID  . LORazepam  0-4 mg Intravenous Q12H  . multivitamin with minerals  1 tablet Oral Daily  . nicotine  21 mg Transdermal Daily  . ofloxacin  2 drop Both Eyes QID  . phenytoin  200 mg Oral QHS  . thiamine  100 mg Oral Daily   Or  . thiamine  100 mg Intravenous Daily   Continuous Infusions: PRN Meds:.guaiFENesin-dextromethorphan, ipratropium-albuterol, [DISCONTINUED] ondansetron **OR** ondansetron (ZOFRAN) IV, traMADol   Assessment: Principal Problem:   Sepsis (HCC) Active Problems:   Acute encephalopathy   UTI (urinary tract infection)   Alcohol abuse   Seizures (HCC)   Conjunctivitis  Trevor Lambert is a 58 y.o. y/o male admitted with urosepsis, transferred to the ICU for hypotension and very likely due to aspiration pneumonia.  On antibiotics.  I have been called for evaluation of dysphagia.  Incidentally on right upper quadrant ultrasound a 1.7 cm hyperechoic lesion is seen in the right hepatic lobe in the setting of features suggestive of cirrhosis.  Albumin is 2.0 which may be low secondary to acute inflammation as it is negative acute phase reactant.  Platelet count is low likely due to portal hypertension.  In the setting of aspiration pneumonia I would not recommend an EGD at this point of time unless urgent.  Barium swallow shows a distal esophageal ulcer along the lateral aspect of indeterminant etiology with underlying esophagitis.  Moderate gastroesophageal reflux and no strictures noted  Plan 1.Once the aspiration pneumonia has resolved as an outpatient can consider to perform an upper endoscopy to not only evaluate the dysphagia further but also to rule out esophageal varices as he has a new diagnosis of cirrhosis of the liver.  In the interim I suggest we treat him with high-dose PPI to heal the esophagitis and esophageal ulcer if is due to reflux.  2.  When permissible will require MRI liver mass protocol to determine if the lesion seen on  the ultrasound is hepatocellular carcinoma.  4.  As an outpatient he would require liver cirrhosis work-up.  5.  If the patient is able to eat and drink then should be able to go home and follow-up as an outpatient in my clinic in 1 to 2 weeks.  I will sign off.  Please call me if any further GI concerns or questions.  We would like to thank you for the opportunity to participate in the care of Nadarius Sunderlin.    LOS: 4 days   Trevor Mood, MD 07/12/2018, 10:27 AM

## 2018-07-12 NOTE — Progress Notes (Signed)
PT Cancellation Note  Patient Details Name: Emilian Hinnenkamp MRN: [id number] DOB: [birth year]   Cancelled Treatment:    Reason Eval/Treat Not Completed: Patient declined, no reason specified   Flatly refused session.  No reason given.   Danielle Dess 07/12/2018, 11:40 AM

## 2018-07-12 NOTE — Progress Notes (Signed)
Patient's prescriptions faxed per his sister in law's request so that they will be ready to pick up.  Trevor Lambert, BSN

## 2018-07-12 NOTE — Progress Notes (Addendum)
Speech Language Pathology Treatment: Dysphagia  Patient Details Name: Trevor Lambert MRN: [id number] DOB: [birth year] Today's Date: 07/12/2018 Time: 1050-1130 SLP Time Calculation (min) (ACUTE ONLY): 40 min  Assessment / Plan / Recommendation Clinical Impression  Pt appears to present w/ adequate oropharyngeal phase swallowing function w/ reduced risk for prandial aspiration when following general aspiration precautions. Pt does have, per the Barium swallow study GI, a distal esophageal ulcer along the lateral aspect of indeterminant etiology with underlying esophagitis; Moderate gastroesophageal reflux. He is to receive treatment with high-dose PPI to heal the esophagitis and esophageal ulcer if is due to reflux. GI will also assess for Esophageal Varices. Pt needs Support at meals and Dietician f/u.  This was a brief session d/t limited number of PO trials per pt acceptance. He seemed distracted this morning post the Barium study. Pt consumed po trials of thin liquids (juices) w/ No overt s/s pharyngeal dysphagia observed. Swallow initiation appeared timely. Vocal quality remained clear thoughout evaluation. Oral phase appeared grossly wfl. Pt is missing dentition, poor quality of remaining dentition. Pt also reports significant tooth pain on lower L side of his jaw and stated he needs to have a tooth extracted for some time. NSG aware.  However it is SIGNIFICANT to note pt is positive for s/s of esophageal dysphagia c/b consistent mild BELCHING following sips of thin liquid, pt also reports food/drink sometimes "comes back up" on him during meals. Noted Barium study report and GI assessment note today re: pt's status. Educated pt re: aspiration and Strict Reflux precautions and general safe swallow recommendations w/ foods/liquids. Pt agreed. Recommend continue with current Dysphagia III diet with thin liquids w/ moistened foods, cut small; Pills whole with thin liquid or in Puree if needed for easier  swallowing and clearing of the Esophagus. NSG updated. ST services will be available for further education while admitted.  Addendum: ANY Esophageal dysmotility or Regurgitation episode can increase risk for aspiration of the Regurgitation/Reflux thus impact Pulmonary status. Recommend continued f/u w/ GI for management.    HPI HPI: Pt is a 58 y.o. male who presents with chief complaint Seizure; pt also has had f/u by GI.  Patient presents to the ED brought by family after having falls at home.  He has a healing laceration on his face from where he fell.  Evaluation here in the ED shows UA mildly suspicious for UTI, the patient denies any symptoms of UTI.  He does have a history of seizure disorder since childhood, and recently states he has not been taking all of his seizure medications.  He also has a significant alcohol use history, but over the last couple of days has not been drinking as much.  Of note, patient also has significant purulent drainage from both eyes suspicious for bacterial conjunctivitis.  Hospitalist were called for admission and further evaluation.       SLP Plan  Continue with current plan of care       Recommendations  Diet recommendations: Dysphagia 3 (mechanical soft);Thin liquid(moistened well, cut small) Liquids provided via: Straw;Cup Medication Administration: Whole meds with puree(if easier for clearing Esophagus; w/ liquids if able) Supervision: Patient able to self feed;Intermittent supervision to cue for compensatory strategies Compensations: Minimize environmental distractions;Slow rate;Small sips/bites;Lingual sweep for clearance of pocketing;Follow solids with liquid Postural Changes and/or Swallow Maneuvers: Seated upright 90 degrees;Upright 30-60 min after meal                General recommendations: (Dietician f/u; GI f/u) Oral  Care Recommendations: Oral care BID;Staff/trained caregiver to provide oral care Follow up Recommendations: None(at this  time) SLP Visit Diagnosis: Dysphagia, unspecified (R13.10)(Esophageal) Plan: Continue with current plan of care       GO                Jerilynn Som, MS, CCC-SLP Trevor Lambert 07/12/2018, 2:42 PM

## 2018-07-12 NOTE — Discharge Summary (Signed)
[facility] [place] [facility]   PATIENT NAME: Trevor Lambert    MR#:  [id number]  DATE OF BIRTH:  Nov 25, 1960  DATE OF ADMISSION:  07/08/2018   ADMITTING PHYSICIAN: Oralia Manis, MD  DATE OF DISCHARGE: 07/12/2018  PRIMARY CARE PHYSICIAN: Josefine Class, Hanley Hays, MD   ADMISSION DIAGNOSIS:  Hypokalemia [E87.6] Seizure (HCC) [R56.9] Hypoglycemia [E16.2] Acute cystitis without hematuria [N30.00] Facial laceration, initial encounter [S01.81XA] Hypotension, unspecified hypotension type [I95.9] DISCHARGE DIAGNOSIS:  Principal Problem:   Sepsis (HCC) Active Problems:   Acute encephalopathy   UTI (urinary tract infection)   Alcohol abuse   Seizures (HCC)   Conjunctivitis  SECONDARY DIAGNOSIS:   Past Medical History:  Diagnosis Date  . Seizures [facility])    HOSPITAL COURSE:  Chief complaint; fall at home   History of presenting complaint; Agron Swiney  is a 58 y.o. male who presented to the ED brought by family after having falls at home.  He has a healing laceration on his face from where he fell.  Evaluation here in the ED shows UA mildly suspicious for UTI, the patient denies any symptoms of UTI.  He does have a history of seizure disorder since childhood, and recently states he has not been taking all of his seizure medications.  He also has a significant alcohol use history, but over the last couple of days has not been drinking as much.  Of note, patient also has significant purulent drainage from both eyes suspicious for bacterial conjunctivitis.  Hospitalist were called for admission and further evaluation.  Please refer to the H&P dictated for further details   Hospital course; 1.  Sepsis secondary to probable urinary tract infection and aspiration pneumonia likely related to being postictal from seizures.  Most likely present on admission. Initial chest x-ray was negative but follow-up chest x-ray during this admission revealed bilateral airspace  disease concerning for aspiration.Patient had to be transferred to ICU on 07/09/2018 due to persistent hypotension after about 3 L of IV fluids.  Patient did not require to be placed on pressors.  Blood pressure remains fairly stable on gentle IV fluids.Patient previously treated with IV Rocephin with vancomycin Patient was managed in the ICU by critical care physician.  Antibiotics already changed to p.o. Augmentin to cover for aspiration pneumonia.    Being discharged on p.o. Augmentin to complete treatment duration.Urine culture with insignificant growth.    2.Acute encephalopathy -due to UTI versus his seizures.  Improved significantly.  Patient awake and alert and oriented .back to baseline   3.  Seizures Patient presented with breakthrough seizures secondary to noncompliance with meds. Dilantin level slightly elevated at 21.5. Being followed by neurologist.  Recommendation is to resume Dilantin at 200 mg p.o. daily since Dilantin level now within therapeutic range.  Continue Keppra.  Patient unable to drive, operate heavy machinery, perform activities at heights and participate in water activities until release by outpatient physician  4.Alcohol abuse -CIWA protocol, alcohol level was negative in the ED, this could potentially have lowered his seizure threshold.patient was treated with multivitamin, thiamine and folic acid during this admission  5.  Bilateral bacterial conjunctivitis Significantly improved with ofloxacin eyedrops  6.  Liver cirrhosis Patient had liver ultrasound done which revealed findings consistent with cirrhosis. 1.7 cm hyperechoic lesion in the right hepatic lobe. When thepatient is clinically stable and able to follow directions and hold their breath (preferably as an outpatient) further evaluation with dedicated abdominal MRI with and without contrast is recommended.  Patient seen by gastroenterologist.  Discussed case with gastroenterologist Dr. Sharlet Salina  today.  Plan is to follow-up with gastroenterologist for outpatient MRI with liver protocol to determine if lesion seen on ultrasound is hepatocellular carcinoma.  Patient and sister at bedside agrees with plans.   7.  Thrombocytopenia; improving Most likely secondary to chronic alcohol abuse.  Monitor.  8.  Hypokalemia and hypomagnesemia Replaced  9.  Dysphagia Likely contributed to aspiration pneumonia Seen by speech therapist.  Patient seen by gastroenterologist.  Patient had small bowel with tablet studies done today which revealed findings consistent with esophagitis.  I discussed case with gastroenterologist who recommended placing patient on PPI twice daily and to follow-up with him in gastroenterology clinic for outpatient endoscopy in the next 7 to 10 days.  No plans for endoscopic evaluation at this time due to recent aspiration pneumonia and esophagitis  10.  Debility due to multiple medical problems listed above. Patient with unsteady gait.  Fall at home.  Seen by physical therapist.  Recommendation is to set up home health with physical therapy on discharge.  DISCHARGE CONDITIONS:  Stable CONSULTS OBTAINED:  Treatment Team:  Kym Groom, MD DRUG ALLERGIES:  No Known Allergies DISCHARGE MEDICATIONS:   Allergies as of 07/12/2018   No Known Allergies     Medication List    TAKE these medications   amoxicillin-clavulanate 875-125 MG tablet Commonly known as:  AUGMENTIN Take 1 tablet by mouth every 12 (twelve) hours.   guaiFENesin-dextromethorphan 100-10 MG/5ML syrup Commonly known as:  ROBITUSSIN DM Take 5 mLs by mouth every 4 (four) hours as needed for up to 5 days for cough.   levETIRAcetam 500 MG tablet Commonly known as:  KEPPRA Take 500 mg by mouth.   nicotine 21 mg/24hr patch Commonly known as:  NICODERM CQ - dosed in mg/24 hours Place 1 patch (21 mg total) onto the skin daily. Start taking on:  July 13, 2018   ofloxacin 0.3 % ophthalmic  solution Commonly known as:  OCUFLOX Place 2 drops into both eyes 4 (four) times daily for 3 days.   pantoprazole 40 MG tablet Commonly known as:  PROTONIX Take 1 tablet (40 mg total) by mouth 2 (two) times daily.   phenytoin 200 MG ER capsule Commonly known as:  DILANTIN Take 1 capsule (200 mg total) by mouth at bedtime. What changed:    medication strength  how much to take   simvastatin 20 MG tablet Commonly known as:  ZOCOR Take 20 mg by mouth daily.        DISCHARGE INSTRUCTIONS:   DIET:  Cardiac diet DISCHARGE CONDITION:  Stable ACTIVITY:  Activity as tolerated OXYGEN:  Home Oxygen: No.  Oxygen Delivery: room air DISCHARGE LOCATION:  home   If you experience worsening of your admission symptoms, develop shortness of breath, life threatening emergency, suicidal or homicidal thoughts you must seek medical attention immediately by calling 911 or calling your MD immediately  if symptoms less severe.  You Must read complete instructions/literature along with all the possible adverse reactions/side effects for all the Medicines you take and that have been prescribed to you. Take any new Medicines after you have completely understood and accpet all the possible adverse reactions/side effects.   Please note  You were cared for by a hospitalist during your hospital stay. If you have any questions about your discharge medications or the care you received while you were in the hospital after you are discharged, you can call the unit and asked  to speak with the hospitalist on call if the hospitalist that took care of you is not available. Once you are discharged, your primary care physician will handle any further medical issues. Please note that NO REFILLS for any discharge medications will be authorized once you are discharged, as it is imperative that you return to your primary care physician (or establish a relationship with a primary care physician if you do not have one)  for your aftercare needs so that they can reassess your need for medications and monitor your lab values.    On the day of Discharge:  VITAL SIGNS:  Blood pressure 103/71, pulse 99, temperature 98.3 F (36.8 C), temperature source Oral, resp. rate 16, height 5\' 6"  (1.676 m), weight 66.3 kg, SpO2 99 %. PHYSICAL EXAMINATION:  GENERAL:  58 y.o.-year-old patient lying in the bed with no acute distress.  EYES: Pupils equal, round, reactive to light and accommodation. No scleral icterus. Extraocular muscles intact.  HEENT: Head atraumatic, normocephalic. Oropharynx and nasopharynx clear.  NECK:  Supple, no jugular venous distention. No thyroid enlargement, no tenderness.  LUNGS: Normal breath sounds bilaterally, no wheezing, rales,rhonchi or crepitation. No use of accessory muscles of respiration.  CARDIOVASCULAR: S1, S2 normal. No murmurs, rubs, or gallops.  ABDOMEN: Soft, non-tender, non-distended. Bowel sounds present. No organomegaly or mass.  EXTREMITIES: No pedal edema, cyanosis, or clubbing.  NEUROLOGIC: Cranial nerves II through XII are intact. Muscle strength 5/5 in all extremities. Sensation intact. Gait not checked.  PSYCHIATRIC: The patient is alert and oriented x 3.  SKIN: No obvious rash, lesion, or ulcer.  DATA REVIEW:   CBC Recent Labs  Lab 07/11/18 0344  WBC 6.2  HGB 8.8*  HCT 27.4*  PLT 98*    Chemistries  Recent Labs  Lab 07/09/18 0630  07/11/18 0344  NA 134*   < > 135  K 3.6   < > 3.6  CL 103   < > 110  CO2 23   < > 19*  GLUCOSE 66*   < > 108*  BUN 9   < > 7  CREATININE 0.96   < > 0.55*  CALCIUM 7.0*   < > 7.4*  MG  --    < > 2.1  AST 46*  --   --   ALT 21  --   --   ALKPHOS 76  --   --   BILITOT 0.5  --   --    < > = values in this interval not displayed.     Microbiology Results  Results for orders placed or performed during the hospital encounter of 07/08/18  Urine culture     Status: Abnormal   Collection Time: 07/08/18  7:14 PM  Result  Value Ref Range Status   Specimen Description   Final    URINE, CLEAN CATCH Performed at [facility], [address] [place], [place] [address]    Special Requests   Final    NONE Performed at [facility], [address] [place], [place] [address]    Culture (A)  Final    <10,000 COLONIES/mL INSIGNIFICANT GROWTH Performed at [facility], [address] [place], [place] [address]    Report Status 07/10/2018 FINAL  Final  MRSA PCR Screening     Status: None   Collection Time: 07/09/18  5:47 PM  Result Value Ref Range Status   MRSA by PCR NEGATIVE NEGATIVE Final    Comment:  The GeneXpert MRSA Assay (FDA approved for NASAL specimens only), is one component of a comprehensive MRSA colonization surveillance program. It is not intended to diagnose MRSA infection nor to guide or monitor treatment for MRSA infections. Performed at [facility], [address] [place], [place] [address]     RADIOLOGY:  Dg Esophagus W Double Cm (hd)  Result Date: 07/12/2018 CLINICAL DATA:  Dysphagia with solids and liquids for 2 months EXAM: ESOPHOGRAM / BARIUM SWALLOW / BARIUM TABLET STUDY TECHNIQUE: Combined double contrast and single contrast examination performed using effervescent crystals, thick barium liquid, and thin barium liquid. The patient was observed with fluoroscopy swallowing a 13 mm barium sulphate tablet. FLUOROSCOPY TIME:  Fluoroscopy Time:  1 minutes 30 seconds Radiation Exposure Index (if provided by the fluoroscopic device): 7.5 mGy Number of Acquired Spot Images: 0 COMPARISON:  None. FINDINGS: There was normal pharyngeal anatomy and motility. Contrast flowed freely through the esophagus without evidence of stricture or mass. Mucosal irregularity along the right side of the distal esophagus with ulceration. Mild thickening of the reticular markings of the distal esophagus concerning for esophagitis. Esophageal motility was normal.  Moderate gastroesophageal reflux. Small hiatal hernia. No extraluminal contrast to suggest perforation. At the end of the examination a 13 mm barium tablet was administered which transited through the esophagus and esophagogastric junction without delay. IMPRESSION: 1. Distal esophageal ulcer along the right lateral aspect of indeterminate etiology. Underlying esophagitis. Further evaluation with upper endoscopy may be helpful. 2. Moderate gastroesophageal reflux. 3. No esophageal stricture. Electronically Signed   By: Elige Ko   On: 07/12/2018 09:32     Management plans discussed with the patient, family and they are in agreement.  CODE STATUS: Full Code   TOTAL TIME TAKING CARE OF THIS PATIENT: 38 minutes.    Rielle Schlauch M.D on 07/12/2018 at 10:58 AM  Between 7am to 6pm - Pager - [phone]  After 6pm go to [email or web address] - Social research officer, government  [facility]  Office  [phone]  CC: Primary care physician; Josefine Class, Hanley Hays, MD   Note: This dictation was prepared with Dragon dictation along with smaller phrase technology. Any transcriptional errors that result from this process are unintentional.

## 2018-07-12 NOTE — Care Management Note (Signed)
Case Management Note  Patient Details  Name: Trevor Lambert MRN: [id number] Date of Birth: [birth year]  Subjective/Objective:                 Patient is to discharge home today.  Current address is his mother's "because she pays patient's bill when the SSI check come.  Patient lives [address] [place].  His sister in law who lives next door will be patient's primary contact for scheduling of visits because patient does not have a phone. Jessiel Langlais [phone].  patient does have a pcp in .  Discussed with Velna Hatchet that patient voiced he would like to  change to a more local practice.  Current pcp is listed on patient's medicaid  card. Instructed Velna Hatchet that patient must be the one to change this and to call dept of social services to initiate change. Patient is in agreement with home health. No agency preference   Action/Plan:  Referral called to and accepted by [facility]. Informed of change in address and the Sister in  law Velna Hatchet is to be called to schedule all home health visits.  Expected Discharge Date:  07/12/18               Expected Discharge Plan:     In-House Referral:     Discharge planning Services  CM Consult  Post Acute Care Choice:  Durable Medical Equipment, Home Health Choice offered to:  Patient, Sibling  DME Arranged:  Dan Humphreys DME Agency:  [facility]  HH Arranged:  RN, PT, Social Work [facility] Agency:  [facility]  Status of Service:  Completed, signed off  If discussed at [employer] of Stay Meetings, dates discussed:    Additional Comments:  Eber Hong, RN 07/12/2018, 4:16 PM

## 2018-08-05 ENCOUNTER — Telehealth: Payer: Self-pay | Admitting: Gastroenterology

## 2018-08-05 NOTE — Telephone Encounter (Signed)
I was ask by Trevor Lambert to call patient and let him know per Dr Tobi Bastos he should follow up with his PCP to see if his pneumonia is better and to check on his swollowing was better or not.  The caregiver called with the patient & he stated he was able to swallow better. They were informed about the PCP and we should contact them again in 2-3 weeks to check on him.

## 2018-08-09 ENCOUNTER — Ambulatory Visit: Payer: Medicare Other | Admitting: Gastroenterology

## 2018-08-15 ENCOUNTER — Ambulatory Visit (INDEPENDENT_AMBULATORY_CARE_PROVIDER_SITE_OTHER): Payer: Medicare Other | Admitting: Gastroenterology

## 2018-08-15 ENCOUNTER — Telehealth: Payer: Self-pay

## 2018-08-15 ENCOUNTER — Telehealth: Payer: Medicare Other | Admitting: Gastroenterology

## 2018-08-15 DIAGNOSIS — R1310 Dysphagia, unspecified: Secondary | ICD-10-CM

## 2018-08-15 NOTE — Telephone Encounter (Signed)
Called pt regarding today's E-visit with Dr. Robbie Lis to contact LVM to return call

## 2018-08-15 NOTE — Progress Notes (Signed)
Cancelled visit

## 2018-08-15 NOTE — Telephone Encounter (Signed)
Pt has requested to reschedule E-visit with Dr. Tobi Bastos to a different day.

## 2018-08-16 ENCOUNTER — Ambulatory Visit (INDEPENDENT_AMBULATORY_CARE_PROVIDER_SITE_OTHER): Payer: Medicare Other | Admitting: Gastroenterology

## 2018-08-16 ENCOUNTER — Other Ambulatory Visit: Payer: Self-pay | Admitting: Gastroenterology

## 2018-08-16 DIAGNOSIS — R609 Edema, unspecified: Secondary | ICD-10-CM

## 2018-08-16 DIAGNOSIS — K7030 Alcoholic cirrhosis of liver without ascites: Secondary | ICD-10-CM | POA: Diagnosis not present

## 2018-08-16 DIAGNOSIS — Z114 Encounter for screening for human immunodeficiency virus [HIV]: Secondary | ICD-10-CM | POA: Diagnosis not present

## 2018-08-16 DIAGNOSIS — M7989 Other specified soft tissue disorders: Secondary | ICD-10-CM

## 2018-08-16 DIAGNOSIS — R1310 Dysphagia, unspecified: Secondary | ICD-10-CM | POA: Diagnosis not present

## 2018-08-16 NOTE — Progress Notes (Signed)
Wyline Mood , MD [address]  [address]  [place], [place] [address]  Main: [phone]  Fax: [phone]   Primary Care Physician: Dennison Nancy, MD  Virtual Visit via Telephone Note  I connected with patient on 08/16/18 at 11:30 AM EDT by telephone and verified that I am speaking with the correct person using two identifiers.   I discussed the limitations, risks, security and privacy concerns of performing an evaluation and management service by telephone and the availability of in person appointments. I also discussed with the patient that there may be a patient responsible charge related to this service. The patient expressed understanding and agreed to proceed.  Location of Patient: Home Location of Provider: Home Persons involved: Patient and provider only   History of Present Illness: Chief Complaint  Patient presents with  . Dysphagia    HPI: Trevor Lambert is a 58 y.o. male    Summary of history : I am speaking with him today to follow-up from his recent hospital discharge on 07/11/2018.  He was admitted at that point of time with urosepsis and possible aspiration pneumonia and into the ICU with hypotension.  He has a history of alcohol abuse.  Incidentally was found to have cirrhosis of the liver and a 1.7 cm hyperechoic lesion in the right hepatic lobe.  I was also consulted for dysphagia.  He did recall that he had been having difficulty swallowing for a few weeks mostly of solids he mentioned that it did get stuck in his neck denied a similar issue in the past.  I obtained a barium swallow which showed a distal esophageal ulcer along the lateral aspect of indeterminant etiology with underlying esophagitis.  Moderate gastroesophageal reflux was noted and no strictures seen.  I did not wish to pursue with an endoscopy at that point of time as he had been admitted with an aspiration pneumonia.   Interval history   07/12/2018-  08/16/2018  Since discharge he  says he is doing ok. Since discharge no alcohol. No issues with swallowing . Denies any issue with his liver in the past , no military service , no tatoos, no blood transfusion. No confusion . Denies any constipation .  Last few days says rt foot has been swollen . Eating well.    Current Outpatient Medications  Medication Sig Dispense Refill  . levETIRAcetam (KEPPRA) 500 MG tablet Take 500 mg by mouth.    . phenytoin (DILANTIN) 200 MG ER capsule Take 1 capsule (200 mg total) by mouth at bedtime. 30 capsule 0  . amoxicillin-clavulanate (AUGMENTIN) 875-125 MG tablet Take 1 tablet by mouth every 12 (twelve) hours. (Patient not taking: Reported on 08/16/2018) 8 tablet 0  . nicotine (NICODERM CQ - DOSED IN MG/24 HOURS) 21 mg/24hr patch Place 1 patch (21 mg total) onto the skin daily. (Patient not taking: Reported on 08/16/2018) 14 patch 0  . pantoprazole (PROTONIX) 40 MG tablet Take 1 tablet (40 mg total) by mouth 2 (two) times daily. (Patient not taking: Reported on 08/16/2018) 30 tablet 0  . simvastatin (ZOCOR) 20 MG tablet Take 20 mg by mouth daily.     No current facility-administered medications for this visit.     Allergies as of 08/16/2018  . (No Known Allergies)    Review of Systems:    All systems reviewed and negative except where noted in HPI.   Observations/Objective:  Labs: CMP     Component Value Date/Time   NA 135 07/11/2018  0344   K 3.6 07/11/2018 0344   CL 110 07/11/2018 0344   CO2 19 (L) 07/11/2018 0344   GLUCOSE 108 (H) 07/11/2018 0344   BUN 7 07/11/2018 0344   CREATININE 0.55 (L) 07/11/2018 0344   CALCIUM 7.4 (L) 07/11/2018 0344   PROT 4.9 (L) 07/09/2018 0630   ALBUMIN 2.0 (L) 07/09/2018 0630   AST 46 (H) 07/09/2018 0630   ALT 21 07/09/2018 0630   ALKPHOS 76 07/09/2018 0630   BILITOT 0.5 07/09/2018 0630   GFRNONAA >60 07/11/2018 0344   GFRAA >60 07/11/2018 0344   Lab Results  Component Value Date   WBC 6.2 07/11/2018   HGB 8.8 (L) 07/11/2018   HCT 27.4  (L) 07/11/2018   MCV 88.7 07/11/2018   PLT 98 (L) 07/11/2018    Imaging Studies: No results found.  Assessment and Plan:   Trevor Lambert is a 58 y.o. y/o male is here today to follow-up to his recent hospital discharge at the end of February 2020 when he was admitted with urosepsis and aspiration pneumonia.  I was consulted for dysphagia and an incidental finding of an upper hyperechoic lesion on his liver ultrasound.  He was also noted to have cirrhosis of the liver on imaging.  Barium showed distal esophageal ulcer with esophagitis and gastroesophageal reflux.  EGD was deferred at that point of time due to his aspiration pneumonia.  Plan :   1.  Complete viral and autoimmune work-up to determine the etiology of his liver cirrhosis although could be from alcohol. 2.  Stop all alcohol consumption. 3.  MRI liver mass protocol to obtain better information of the liver lesion that was seen.  #4 4.  Continue on high-dose PPI.  Twice daily 5.  EGD in 8 weeks time to determine if the esophageal ulcer has healed. 6.  Based on viral and autoimmune work-up will determine if he is requiring any vaccination for hepatitis A and B.  To obtain vaccination for tetanus, pneumonia, flu with his primary care physician. 7.  Obtain labs including CBC, CMP, INR to calculate meld score. 8. Rt leg to r/o DVT with dopplers will be arranged.   Follow Up Instructions: Follow-up in 4 weeks via WebEx call in office visit if we have resumed operations.    I discussed the assessment and treatment plan with the patient. The patient was provided an opportunity to ask questions and all were answered. The patient agreed with the plan and demonstrated an understanding of the instructions.   The patient was advised to call back or seek an in-person evaluation if the symptoms worsen or if the condition fails to improve as anticipated.  I provided 30  minutes of non-face-to-face time during this encounter.  Dr Wyline Mood  MD,MRCP [facility]) Gastroenterology/Hepatology Pager: [phone]   Speech recognition software was used to dictate this note.

## 2018-08-17 ENCOUNTER — Telehealth: Payer: Self-pay | Admitting: Gastroenterology

## 2018-08-17 ENCOUNTER — Ambulatory Visit
Admission: RE | Admit: 2018-08-17 | Discharge: 2018-08-17 | Disposition: A | Discharge status: Home / Self Care | Payer: Medicare Other | Source: Ambulatory Visit | Attending: Gastroenterology | Admitting: Gastroenterology

## 2018-08-17 ENCOUNTER — Other Ambulatory Visit: Payer: Self-pay

## 2018-08-17 DIAGNOSIS — R609 Edema, unspecified: Secondary | ICD-10-CM | POA: Diagnosis not present

## 2018-08-17 NOTE — Telephone Encounter (Signed)
Judeth Cornfield from  [facility] is waitng on the order please fax to [phone]

## 2018-08-17 NOTE — Telephone Encounter (Signed)
[facility] [address] is calling to give results on pt please call stephanie at [phone]

## 2018-08-18 ENCOUNTER — Encounter: Payer: Self-pay | Admitting: Gastroenterology

## 2018-08-18 LAB — COMPREHENSIVE METABOLIC PANEL
ALT: 16 IU/L (ref 0–44)
AST: 25 IU/L (ref 0–40)
Albumin/Globulin Ratio: 0.7 — ABNORMAL LOW (ref 1.2–2.2)
Albumin: 2.4 g/dL — ABNORMAL LOW (ref 3.8–4.9)
Alkaline Phosphatase: 112 IU/L (ref 39–117)
BUN/Creatinine Ratio: 7 — ABNORMAL LOW (ref 9–20)
BUN: 5 mg/dL — ABNORMAL LOW (ref 6–24)
Bilirubin Total: 0.3 mg/dL (ref 0.0–1.2)
CO2: 23 mmol/L (ref 20–29)
Calcium: 7.9 mg/dL — ABNORMAL LOW (ref 8.7–10.2)
Chloride: 100 mmol/L (ref 96–106)
Creatinine, Ser: 0.71 mg/dL — ABNORMAL LOW (ref 0.76–1.27)
GFR calc Af Amer: 121 mL/min/{1.73_m2} (ref >59)
GFR calc non Af Amer: 105 mL/min/{1.73_m2} (ref >59)
Globulin, Total: 3.4 g/dL (ref 1.5–4.5)
Glucose: 94 mg/dL (ref 65–99)
Potassium: 3.5 mmol/L (ref 3.5–5.2)
Sodium: 138 mmol/L (ref 134–144)
Total Protein: 5.8 g/dL — ABNORMAL LOW (ref 6.0–8.5)

## 2018-08-18 LAB — ANA: ANA: NEGATIVE

## 2018-08-18 LAB — CBC WITH DIFFERENTIAL/PLATELET
Basophils Absolute: 0 10*3/uL (ref 0.0–0.2)
Basos: 1 %
EOS (ABSOLUTE): 0.3 10*3/uL (ref 0.0–0.4)
Eos: 6 %
Hematocrit: 24.6 % — ABNORMAL LOW (ref 37.5–51.0)
Hemoglobin: 8.3 g/dL — ABNORMAL LOW (ref 13.0–17.7)
Immature Grans (Abs): 0 10*3/uL (ref 0.0–0.1)
Immature Granulocytes: 0 %
Lymphocytes Absolute: 1.5 10*3/uL (ref 0.7–3.1)
Lymphs: 29 %
MCH: 27 pg (ref 26.6–33.0)
MCHC: 33.7 g/dL (ref 31.5–35.7)
MCV: 80 fL (ref 79–97)
Monocytes Absolute: 0.5 10*3/uL (ref 0.1–0.9)
Monocytes: 9 %
Neutrophils Absolute: 2.9 10*3/uL (ref 1.4–7.0)
Neutrophils: 55 %
Platelets: 258 10*3/uL (ref 150–450)
RBC: 3.07 x10E6/uL — ABNORMAL LOW (ref 4.14–5.80)
RDW: 14.3 % (ref 11.6–15.4)
WBC: 5.3 10*3/uL (ref 3.4–10.8)

## 2018-08-18 LAB — IRON,TIBC AND FERRITIN PANEL
FERRITIN: 395 ng/mL (ref 30–400)
Iron Saturation: 63 % — ABNORMAL HIGH (ref 15–55)
Iron: 41 ug/dL (ref 38–169)
Total Iron Binding Capacity: 65 ug/dL — CL (ref 250–450)
UIBC: 24 ug/dL — ABNORMAL LOW (ref 111–343)

## 2018-08-18 LAB — CELIAC DISEASE PANEL
Endomysial IgA: NEGATIVE
IgA/Immunoglobulin A, Serum: 1017 mg/dL — ABNORMAL HIGH (ref 90–386)
Transglutaminase IgA: <2 U/mL (ref 0–3)

## 2018-08-18 LAB — HEPATITIS B SURFACE ANTIGEN: Hepatitis B Surface Ag: NEGATIVE

## 2018-08-18 LAB — MITOCHONDRIAL/SMOOTH MUSCLE AB PNL
Mitochondrial Ab: <20 Units (ref 0.0–20.0)
Smooth Muscle Ab: 10 Units (ref 0–19)

## 2018-08-18 LAB — PROTIME-INR
INR: 1 (ref 0.8–1.2)
Prothrombin Time: 10.9 s (ref 9.1–12.0)

## 2018-08-18 LAB — HEPATITIS B E ANTIGEN: HEP B E AG: NEGATIVE

## 2018-08-18 LAB — HEPATITIS C ANTIBODY: Hep C Virus Ab: 0.2 s/co ratio (ref 0.0–0.9)

## 2018-08-18 LAB — HEPATITIS B CORE ANTIBODY, TOTAL: Hep B Core Total Ab: NEGATIVE

## 2018-08-18 LAB — ANTI-MICROSOMAL ANTIBODY LIVER / KIDNEY: LKM1 Ab: 1.5 Units (ref 0.0–20.0)

## 2018-08-18 LAB — ALPHA-1-ANTITRYPSIN: A-1 Antitrypsin: 134 mg/dL (ref 101–187)

## 2018-08-18 LAB — IGG, IGA, IGM
IgG (Immunoglobin G), Serum: 1992 mg/dL — ABNORMAL HIGH (ref 603–1613)
IgM (Immunoglobulin M), Srm: 124 mg/dL (ref 20–172)

## 2018-08-18 LAB — CERULOPLASMIN: Ceruloplasmin: 22.1 mg/dL (ref 16.0–31.0)

## 2018-08-18 LAB — HEPATITIS A ANTIBODY, TOTAL: hep A Total Ab: NEGATIVE

## 2018-08-18 LAB — HIV ANTIBODY (ROUTINE TESTING W REFLEX): HIV SCREEN 4TH GENERATION: NONREACTIVE

## 2018-08-18 LAB — HEPATITIS B E ANTIBODY: Hep B E Ab: NEGATIVE

## 2018-08-18 LAB — HEPATITIS B SURFACE ANTIBODY,QUALITATIVE: Hep B Surface Ab, Qual: NONREACTIVE

## 2018-09-14 ENCOUNTER — Encounter: Payer: Medicare Other | Admitting: Gastroenterology

## 2018-09-14 ENCOUNTER — Telehealth: Payer: Self-pay

## 2018-09-14 ENCOUNTER — Other Ambulatory Visit: Payer: Self-pay

## 2018-09-14 DIAGNOSIS — K760 Fatty (change of) liver, not elsewhere classified: Secondary | ICD-10-CM

## 2018-09-14 NOTE — Telephone Encounter (Signed)
Called pt to pre-chart for today's e-visit with Dr. Anna  Unable to contact. LVM to return call 

## 2018-09-14 NOTE — Progress Notes (Signed)
No show

## 2018-09-14 NOTE — Telephone Encounter (Signed)
Judithe Modest returning your call about patient.

## 2018-09-26 ENCOUNTER — Ambulatory Visit
Admission: RE | Admit: 2018-09-26 | Discharge: 2018-09-26 | Disposition: A | Discharge status: Home / Self Care | Payer: Medicare Other | Source: Ambulatory Visit | Attending: Gastroenterology | Admitting: Gastroenterology

## 2018-09-26 ENCOUNTER — Other Ambulatory Visit: Payer: Self-pay

## 2018-09-26 DIAGNOSIS — K760 Fatty (change of) liver, not elsewhere classified: Secondary | ICD-10-CM | POA: Insufficient documentation

## 2018-09-26 IMAGING — MR MR ABDOMEN WO/W CM MRCP
23 of 26 series · 43 of 48 positions shown · IV contrast (gadavist)
Comparison: Ultrasound on [DATE]

CLINICAL DATA: Fatty liver. Indeterminate liver lesion on recent
ultrasound. Abdominal pain for 6 months.

EXAM:
MRI ABDOMEN WITHOUT AND WITH CONTRAST (INCLUDING MRCP)
TECHNIQUE: Multiplanar multisequence MR imaging of the abdomen was performed
both before and after the administration of intravenous contrast.
Heavily T2-weighted images of the biliary and pancreatic ducts were
obtained, and three-dimensional MRCP images were rendered by post
processing.
CONTRAST:  6 mL Gadavist

[Series 3: bSSFP · coronal · 6.0mm · 0.74mm/px · 1 of 30 slices shown]
[im 1/30]
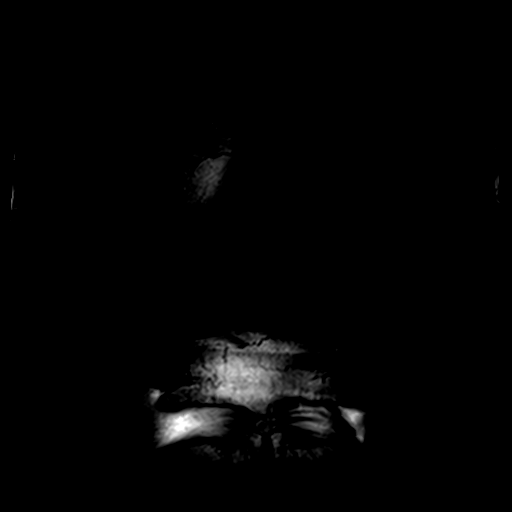

[Series 4: T2 · axial · 6.0mm · 1.19mm/px · 1 of 32 slices shown]
[im 1/32]
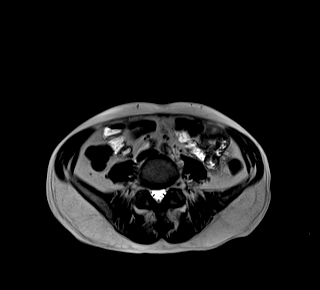

[Series 5: T1 · axial · 6.0mm · 0.74mm/px · 1 of 32 slices shown (1 of 4)]
[im 1/32]
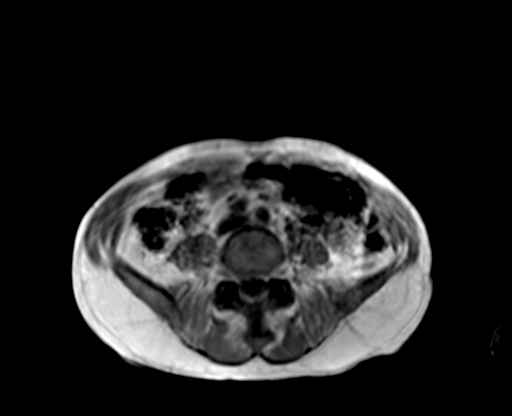

[Series 5: T1 · axial · 6.0mm · 0.74mm/px · 1 of 32 slices shown (2 of 4)]
[im 1/32]
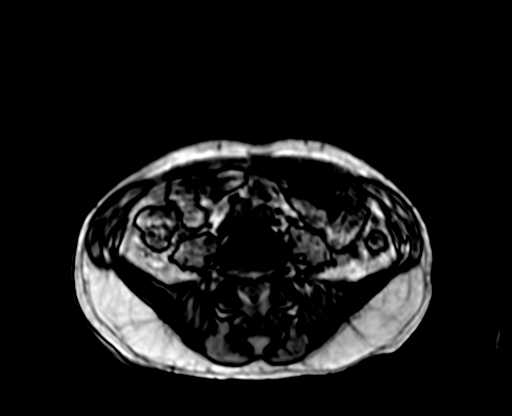

[Series 8: T2 fat-sat · axial · 6.0mm · 1.19mm/px · 1 of 33 slices shown]
[im 1/33]
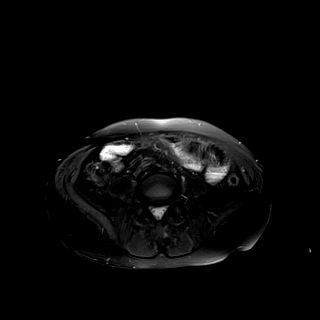

[Series 9: T1 · axial · 6.0mm · 0.74mm/px · 1 of 32 slices shown (3 of 4)]
[im 1/32]
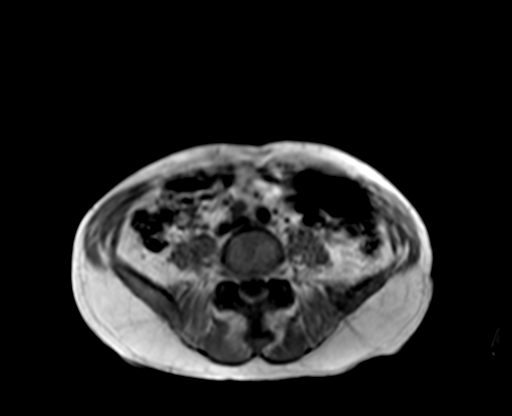

[Series 9: T1 · axial · 6.0mm · 0.74mm/px · 1 of 32 slices shown (4 of 4)]
[im 1/32]
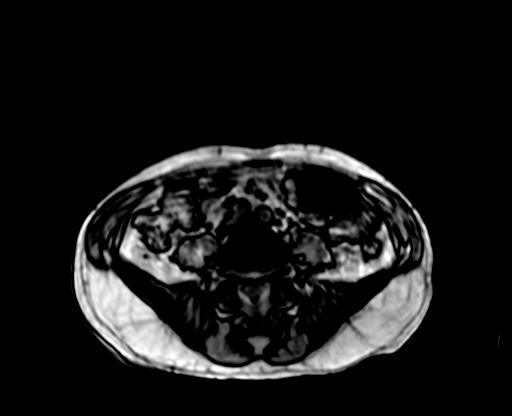

[Series 10: ax dwi_tracew · axial · 6.0mm · 1.42mm/px · 1 of 34 slices shown (1 of 3)]
[im 1/34]
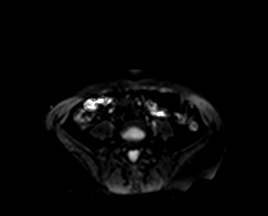

[Series 10: ax dwi_tracew · axial · 6.0mm · 1.42mm/px · 1 of 34 slices shown (2 of 3)]
[im 1/34]
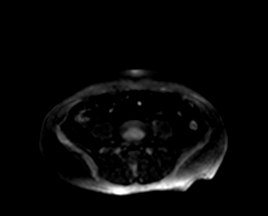

[Series 10: ax dwi_tracew · axial · 6.0mm · 1.42mm/px · 1 of 34 slices shown (3 of 3)]
[im 1/34]
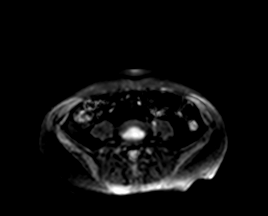

[Series 11: ax dwi_adc · axial · 6.0mm · 1.42mm/px · 1 of 34 slices shown]
[im 1/34]
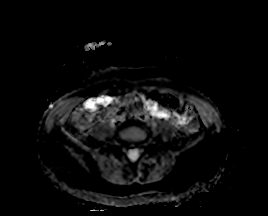

[Series 16: MRCP · coronal · 3.0mm · 1.12mm/px · 1 of 17 slices shown]
[im 1/17]
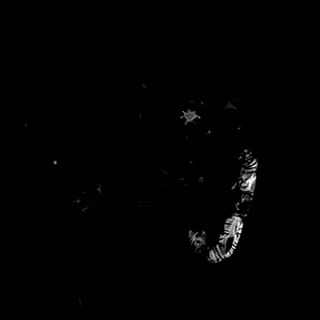

[Series 17: radials · coronal · 50.0mm · 0.78mm/px · 1 of 5 slices shown]
[im 1/5]
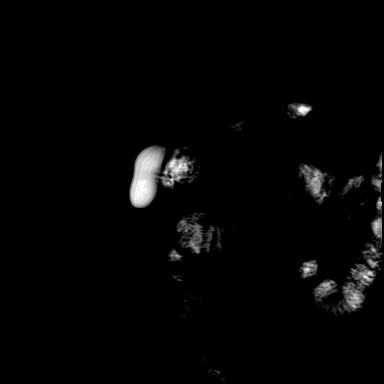

[Series 18: T1 dynamic fat-sat · axial · non-contrast · 3.0mm · 1.19mm/px · z∈[-115,+98]mm · 3 of 72 slices shown (1 of 5)]
[im 1/72]
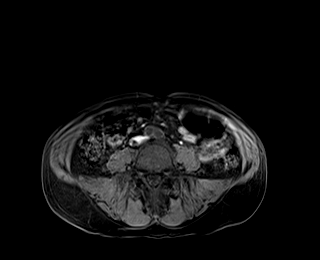
[im 36/72]
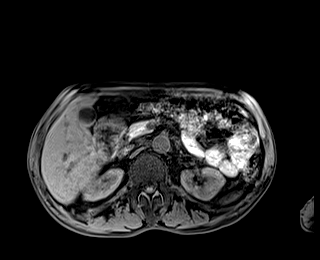
[im 72/72]
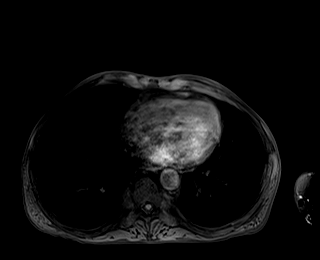

[Series 19: T1 dynamic fat-sat post-contrast · axial · 3.0mm · 1.19mm/px · z∈[-115,+98]mm · 3 of 72 slices shown (1 of 4)]
[im 1/72]
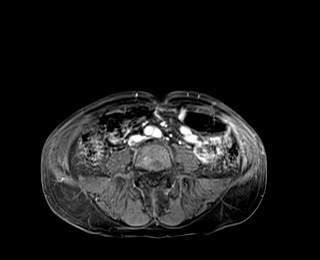
[im 36/72]
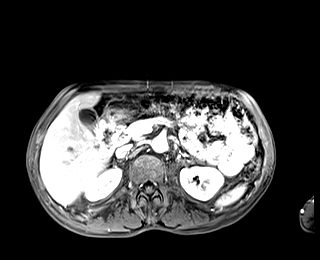
[im 72/72]
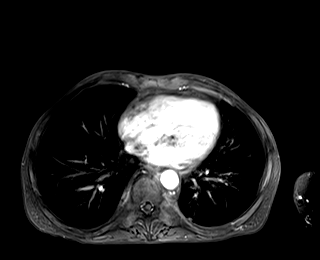

[Series 20: T1 dynamic fat-sat · axial · 3.0mm · 1.19mm/px · z∈[-115,+98]mm · 3 of 72 slices shown (2 of 5)]
[im 1/72]
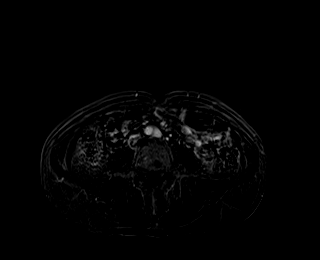
[im 36/72]
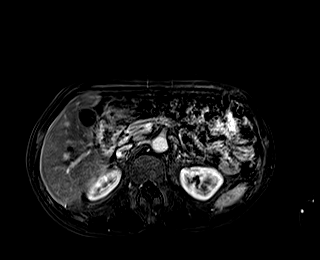
[im 72/72]
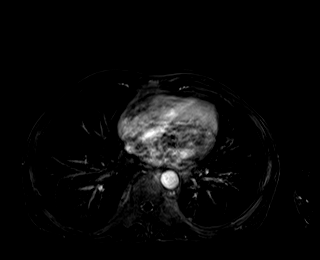

[Series 21: T1 dynamic fat-sat post-contrast · axial · 3.0mm · 1.19mm/px · z∈[-115,+98]mm · 3 of 72 slices shown (2 of 4)]
[im 1/72]
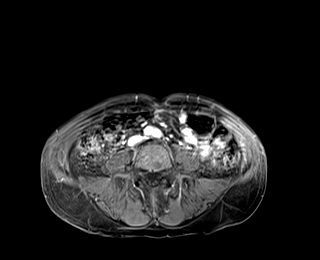
[im 36/72]
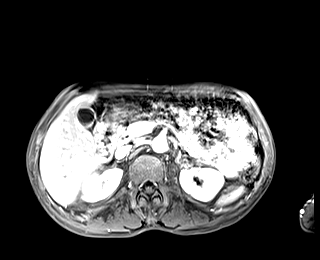
[im 72/72]
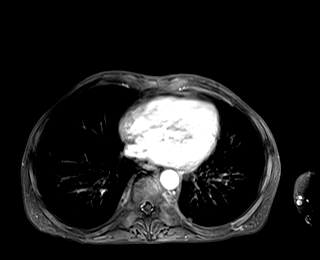

[Series 22: T1 dynamic fat-sat · axial · 3.0mm · 1.19mm/px · z∈[-115,+98]mm · 3 of 72 slices shown (3 of 5)]
[im 1/72]
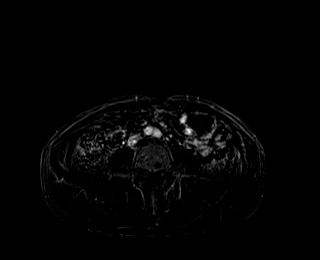
[im 36/72]
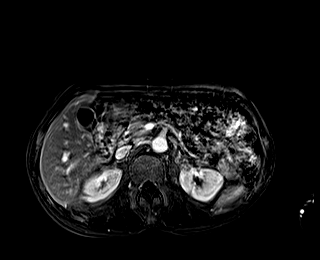
[im 72/72]
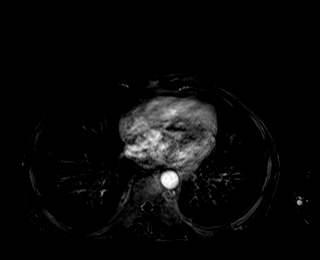

[Series 23: T1 dynamic fat-sat post-contrast · axial · 3.0mm · 1.19mm/px · z∈[-115,+98]mm · 3 of 72 slices shown (3 of 4)]
[im 1/72]
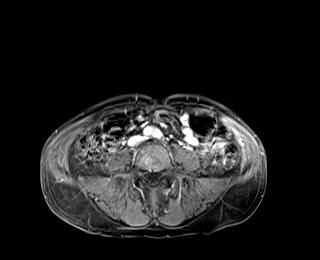
[im 36/72]
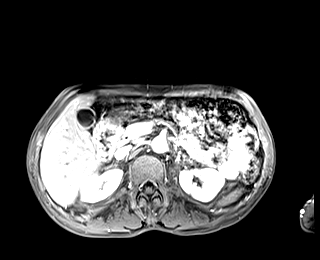
[im 72/72]
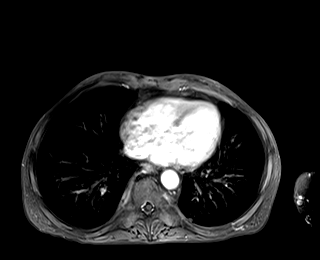

[Series 24: T1 dynamic fat-sat · axial · 3.0mm · 1.19mm/px · z∈[-115,+98]mm · 3 of 72 slices shown (4 of 5)]
[im 1/72]
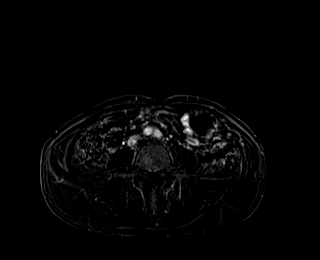
[im 36/72]
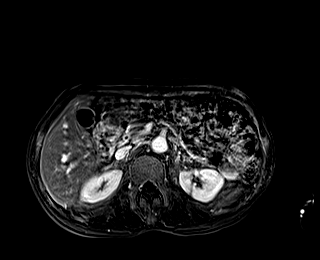
[im 72/72]
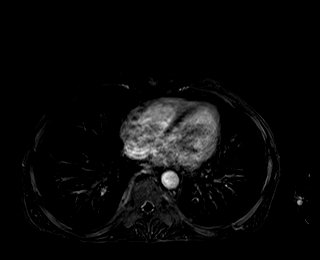

[Series 26: T1 dynamic fat-sat post-contrast · axial · 3.0mm · 1.19mm/px · z∈[-115,+98]mm · 3 of 72 slices shown (4 of 4)]
[im 1/72]
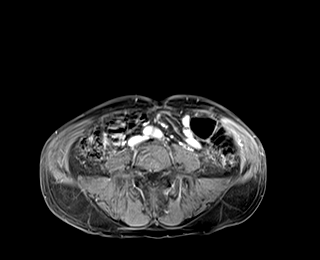
[im 36/72]
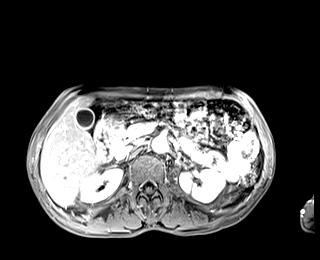
[im 72/72]
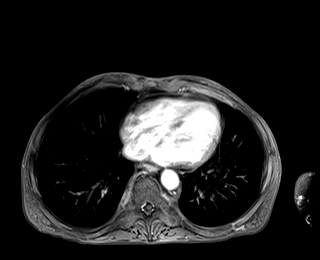

[Series 27: T1 dynamic fat-sat · axial · 3.0mm · 1.19mm/px · z∈[-115,+98]mm · 3 of 72 slices shown (5 of 5)]
[im 1/72]
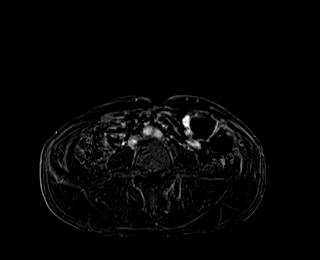
[im 36/72]
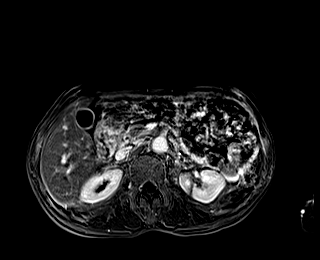
[im 72/72]
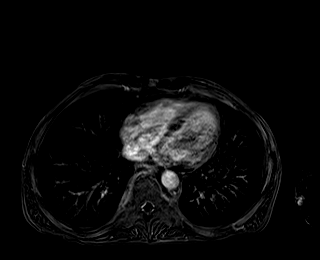

[Series 28: T1 dynamic post-contrast · coronal · 3.0mm · 1.31mm/px · 3 of 72 slices shown]
[im 1/72]
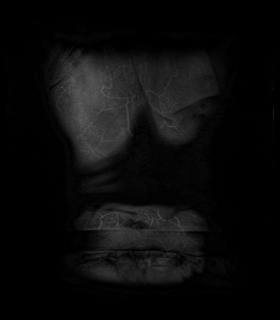
[im 36/72]
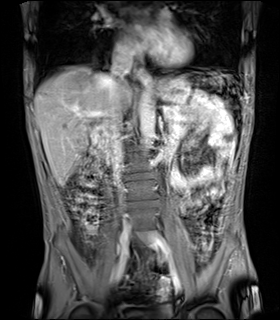
[im 72/72]
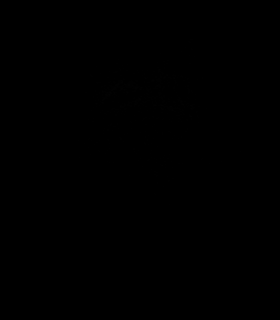

[43 of 48 positions shown; findings below may reference images not displayed]

FINDINGS: Lower chest: No acute findings.

Hepatobiliary: No evidence of steatosis. Scattered tiny sub-cm cysts
are seen in the right and left lobes. There are also 2 sub-cm
hemangioma is seen in segments 3 and 8. No other liver masses
identified. Gallbladder is unremarkable. No evidence of biliary
ductal dilatation.

Pancreas:  No mass or inflammatory changes.

Spleen:  Within normal limits in size and appearance.

Adrenals/Urinary Tract: No masses identified. No evidence of
hydronephrosis.

Stomach/Bowel: Visualized portion unremarkable.

Vascular/Lymphatic: No pathologically enlarged lymph nodes
identified. No abdominal aortic aneurysm.

Other:  None.

Musculoskeletal: 2 vertebral body bone lesions are seen at T11,
which show T2 hyperintensity but T1 hypointensity. These lesions
show nonspecific contrast enhancement and no restricted diffusion.
These are nonspecific features, but may be seen with atypical lipid
poor hemangiomas.
IMPRESSION: Small benign hepatic hemangiomas and cysts. No evidence of hepatic
malignancy.

Two indeterminate T11 vertebral body lesions, which may represent
atypical lipid-poor vertebral hemangiomas. Recommend imaging
follow-up with T-spine MRI without and with contrast in 4-6 months
to assess stability.

## 2018-09-26 MED ORDER — GADOBUTROL 1 MMOL/ML IV SOLN
6.0000 mL | Freq: Once | INTRAVENOUS | Status: AC | PRN
  Administered 2018-09-26: 6 mL via INTRAVENOUS

## 2018-09-29 ENCOUNTER — Telehealth: Payer: Self-pay

## 2018-09-29 ENCOUNTER — Encounter: Payer: Self-pay | Admitting: Gastroenterology

## 2018-09-29 ENCOUNTER — Other Ambulatory Visit: Payer: Self-pay

## 2018-09-29 DIAGNOSIS — D509 Iron deficiency anemia, unspecified: Secondary | ICD-10-CM

## 2018-09-29 NOTE — Progress Notes (Signed)
Jadijah inform the lesions on the liver are benign, there are lesion on the spine which are not clear and need to be evaluated with MRI per radiologist in a few months, I will add Oval Linsey, MD on this message to follow up on. Please schedule his follow up with me for a visit.   Dr Tobi Bastos

## 2018-09-29 NOTE — Telephone Encounter (Signed)
-----   Message from Wyline Mood, MD sent at 09/29/2018 10:09 AM EDT ----- Denton Meek inform needs Hep A/B vaccine, he has a microcytic anemia with normal iron. I would like to refer to DR Cathie Hoops, also note an elevated IGG ??bone marrow issue. Also Aleaha Fickling check hemochromotosis gene . Dr Cathie Hoops there is a lesion on the vertebra on the  MRCP incidentally found. ?related to the anemia .

## 2018-09-29 NOTE — Telephone Encounter (Signed)
Pt has been notified of MRI and lab results as well as Dr. Tobi Bastos instructions for pt to be referred to Dr. Cathie Hoops and pt is aware of the additional lab test and the need for Hep A/B vaccine.

## 2018-09-29 NOTE — Progress Notes (Signed)
Trevor Lambert inform needs Hep A/B vaccine, he has a microcytic anemia with normal iron. I would like to refer to DR Cathie Hoops, also note an elevated IGG ??bone marrow issue. Also Trevor Lambert check hemochromotosis gene . Dr Cathie Hoops there is a lesion on the vertebra on the MRCP incidentally found. ?related to the anemia .

## 2018-10-03 ENCOUNTER — Telehealth: Payer: Self-pay | Admitting: Gastroenterology

## 2018-10-03 NOTE — Telephone Encounter (Signed)
-----   Message from Isa Rankin, [place] sent at 09/29/2018 12:06 PM EDT ----- Regarding: RE: Please offer office visit It's a follow up from his last visit he was suppose to be scheduled for a 4 week follow up on Cirrhosis of liver....first available appt. ----- Message ----- From: Camille Bal Sent: 09/29/2018  11:55 AM EDT To: Isa Rankin, CMA Subject: RE: Please offer office visit                  Will this be a Virtual? also if pt asked the reason why what do I tell him? Thanks ----- Message ----- From: Isa Rankin, CMA Sent: 09/29/2018  11:44 AM EDT To: Trisha Mangle [place] Subject: Please offer office visit                      Tati, please offer Mr. Hasting a follow up appt with Dr. Tobi Bastos.

## 2018-10-03 NOTE — Telephone Encounter (Signed)
Left vm for pt to call office and schedule a 4 week follow up on Cirrhosis of liver....first available appt

## 2018-10-11 ENCOUNTER — Other Ambulatory Visit: Payer: Self-pay

## 2018-10-11 ENCOUNTER — Inpatient Hospital Stay: Payer: Medicare Other | Attending: Oncology | Admitting: Oncology

## 2018-10-11 ENCOUNTER — Encounter: Payer: Self-pay | Admitting: Oncology

## 2018-10-11 DIAGNOSIS — D509 Iron deficiency anemia, unspecified: Secondary | ICD-10-CM | POA: Insufficient documentation

## 2018-10-11 DIAGNOSIS — E538 Deficiency of other specified B group vitamins: Secondary | ICD-10-CM | POA: Diagnosis not present

## 2018-10-11 DIAGNOSIS — F17210 Nicotine dependence, cigarettes, uncomplicated: Secondary | ICD-10-CM | POA: Insufficient documentation

## 2018-10-11 DIAGNOSIS — R768 Other specified abnormal immunological findings in serum: Secondary | ICD-10-CM

## 2018-10-11 DIAGNOSIS — M899 Disorder of bone, unspecified: Secondary | ICD-10-CM | POA: Diagnosis not present

## 2018-10-11 DIAGNOSIS — Z7289 Other problems related to lifestyle: Secondary | ICD-10-CM

## 2018-10-11 DIAGNOSIS — D649 Anemia, unspecified: Secondary | ICD-10-CM

## 2018-10-11 DIAGNOSIS — R9389 Abnormal findings on diagnostic imaging of other specified body structures: Secondary | ICD-10-CM

## 2018-10-11 DIAGNOSIS — D696 Thrombocytopenia, unspecified: Secondary | ICD-10-CM | POA: Insufficient documentation

## 2018-10-11 DIAGNOSIS — Z79899 Other long term (current) drug therapy: Secondary | ICD-10-CM | POA: Insufficient documentation

## 2018-10-11 DIAGNOSIS — Z789 Other specified health status: Secondary | ICD-10-CM

## 2018-10-11 NOTE — Progress Notes (Signed)
Called patient for Telehealth visit via [employer] for new patient consult for anemia.

## 2018-10-14 ENCOUNTER — Other Ambulatory Visit: Payer: Self-pay

## 2018-10-14 ENCOUNTER — Inpatient Hospital Stay: Payer: Medicare Other

## 2018-10-14 DIAGNOSIS — F17210 Nicotine dependence, cigarettes, uncomplicated: Secondary | ICD-10-CM | POA: Diagnosis not present

## 2018-10-14 DIAGNOSIS — M899 Disorder of bone, unspecified: Secondary | ICD-10-CM

## 2018-10-14 DIAGNOSIS — D696 Thrombocytopenia, unspecified: Secondary | ICD-10-CM | POA: Diagnosis not present

## 2018-10-14 DIAGNOSIS — R768 Other specified abnormal immunological findings in serum: Secondary | ICD-10-CM

## 2018-10-14 DIAGNOSIS — Z7289 Other problems related to lifestyle: Secondary | ICD-10-CM

## 2018-10-14 DIAGNOSIS — E538 Deficiency of other specified B group vitamins: Secondary | ICD-10-CM | POA: Diagnosis not present

## 2018-10-14 DIAGNOSIS — D649 Anemia, unspecified: Secondary | ICD-10-CM

## 2018-10-14 DIAGNOSIS — Z789 Other specified health status: Secondary | ICD-10-CM

## 2018-10-14 DIAGNOSIS — D509 Iron deficiency anemia, unspecified: Secondary | ICD-10-CM | POA: Diagnosis not present

## 2018-10-14 DIAGNOSIS — Z79899 Other long term (current) drug therapy: Secondary | ICD-10-CM | POA: Diagnosis not present

## 2018-10-14 LAB — CBC WITH DIFFERENTIAL/PLATELET
Abs Immature Granulocytes: 0.02 10*3/uL (ref 0.00–0.07)
Basophils Absolute: 0 10*3/uL (ref 0.0–0.1)
Basophils Relative: 1 %
Eosinophils Absolute: 0.3 10*3/uL (ref 0.0–0.5)
Eosinophils Relative: 5 %
HCT: 43.8 % (ref 39.0–52.0)
Hemoglobin: 14.1 g/dL (ref 13.0–17.0)
Immature Granulocytes: 0 %
Lymphocytes Relative: 24 %
Lymphs Abs: 1.5 10*3/uL (ref 0.7–4.0)
MCH: 28.3 pg (ref 26.0–34.0)
MCHC: 32.2 g/dL (ref 30.0–36.0)
MCV: 88 fL (ref 80.0–100.0)
Monocytes Absolute: 0.7 10*3/uL (ref 0.1–1.0)
Monocytes Relative: 11 %
Neutro Abs: 3.6 10*3/uL (ref 1.7–7.7)
Neutrophils Relative %: 59 %
Platelets: 141 10*3/uL — ABNORMAL LOW (ref 150–400)
RBC: 4.98 MIL/uL (ref 4.22–5.81)
RDW: 14.9 % (ref 11.5–15.5)
WBC: 6.2 10*3/uL (ref 4.0–10.5)
nRBC: 0 % (ref 0.0–0.2)

## 2018-10-14 LAB — COMPREHENSIVE METABOLIC PANEL
ALT: 22 U/L (ref 0–44)
AST: 34 U/L (ref 15–41)
Albumin: 3.3 g/dL — ABNORMAL LOW (ref 3.5–5.0)
Alkaline Phosphatase: 177 U/L — ABNORMAL HIGH (ref 38–126)
Anion gap: 11 (ref 5–15)
BUN: 6 mg/dL (ref 6–20)
CO2: 25 mmol/L (ref 22–32)
Calcium: 8.7 mg/dL — ABNORMAL LOW (ref 8.9–10.3)
Chloride: 100 mmol/L (ref 98–111)
Creatinine, Ser: 0.85 mg/dL (ref 0.61–1.24)
GFR calc Af Amer: >60 mL/min (ref >60)
GFR calc non Af Amer: >60 mL/min (ref >60)
Glucose, Bld: 92 mg/dL (ref 70–99)
Potassium: 4.5 mmol/L (ref 3.5–5.1)
Sodium: 136 mmol/L (ref 135–145)
Total Bilirubin: 0.3 mg/dL (ref 0.3–1.2)
Total Protein: 7.6 g/dL (ref 6.5–8.1)

## 2018-10-14 LAB — RETIC PANEL
Immature Retic Fract: 5.9 % (ref 2.3–15.9)
RBC.: 4.98 MIL/uL (ref 4.22–5.81)
Retic Count, Absolute: 53.3 10*3/uL (ref 19.0–186.0)
Retic Ct Pct: 1.1 % (ref 0.4–3.1)
Reticulocyte Hemoglobin: 34.2 pg (ref >27.9)

## 2018-10-14 LAB — FOLATE: Folate: 5 ng/mL — ABNORMAL LOW (ref >5.9)

## 2018-10-14 LAB — VITAMIN B12: Vitamin B-12: 174 pg/mL — ABNORMAL LOW (ref 180–914)

## 2018-10-14 LAB — TSH: TSH: 5.788 u[IU]/mL — ABNORMAL HIGH (ref 0.350–4.500)

## 2018-10-14 LAB — TECHNOLOGIST SMEAR REVIEW: Plt Morphology: NORMAL

## 2018-10-17 LAB — MULTIPLE MYELOMA PANEL, SERUM
Albumin SerPl Elph-Mcnc: 3.2 g/dL (ref 2.9–4.4)
Albumin/Glob SerPl: 0.9 (ref 0.7–1.7)
Alpha 1: 0.3 g/dL (ref 0.0–0.4)
Alpha2 Glob SerPl Elph-Mcnc: 0.7 g/dL (ref 0.4–1.0)
B-Globulin SerPl Elph-Mcnc: 1 g/dL (ref 0.7–1.3)
Gamma Glob SerPl Elph-Mcnc: 1.8 g/dL (ref 0.4–1.8)
Globulin, Total: 3.8 g/dL (ref 2.2–3.9)
IgA: 703 mg/dL — ABNORMAL HIGH (ref 90–386)
IgG (Immunoglobin G), Serum: 1769 mg/dL — ABNORMAL HIGH (ref 603–1613)
IgM (Immunoglobulin M), Srm: 133 mg/dL (ref 20–172)
Total Protein ELP: 7 g/dL (ref 6.0–8.5)

## 2018-10-17 LAB — KAPPA/LAMBDA LIGHT CHAINS
Kappa free light chain: 46.8 mg/L — ABNORMAL HIGH (ref 3.3–19.4)
Kappa, lambda light chain ratio: 1.03 (ref 0.26–1.65)
Lambda free light chains: 45.5 mg/L — ABNORMAL HIGH (ref 5.7–26.3)

## 2018-10-18 MED ORDER — VITAMIN B-12 1000 MCG PO TABS: 1000.0000 ug | ORAL_TABLET | Freq: Every day | ORAL | 0 refills | Status: DC

## 2018-10-18 MED ORDER — FOLIC ACID 400 MCG PO TABS: 400.0000 ug | ORAL_TABLET | Freq: Every day | ORAL | 0 refills | Status: DC

## 2018-10-18 NOTE — Progress Notes (Signed)
HEMATOLOGY-ONCOLOGY TeleHEALTH VISIT INITIAL CONSULTATION  I connected with Trevor Lambert on 10/18/18 at  3:00 PM EDT by video enabled telemedicine visit and verified that I am speaking with the correct person using two identifiers. I discussed the limitations, risks, security and privacy concerns of performing an evaluation and management service by telemedicine and the availability of in-person appointments. I also discussed with the patient that there may be a patient responsible charge related to this service. The patient expressed understanding and agreed to proceed.   Other persons participating in the visit and their role in the encounter:  Geraldine Solar, CMA, check in patient    Patient's location: Home  Provider's location: work   Referring provider: Jonathon Bellows, MD  Chief Complaint: initial consultation for evaluation of microcytic anemia.   HISTORY OF PRESENT ILLNESS Trevor Lambert is a 58 y.o. male who was seen in consultation at the request of Jonathon Bellows, MD for evaluation of microcytic anemia.  Patient has history of liver cirrhosis, liver lesion which was recently worked up and believed to be  Barium swallow showed distal esophageal ulcer with underlying esophagitis.  It was noted that he has anemia, with MCV of 80.  Iron panel on 08/17/2018 showed ferritin 395, iron saturation 63. Iron 41.   09/26/2018 MR abdomen MRCP w wo contrast showed small benign hepatic hemangiomas and cysts, no evidence of hepatic malignancy.  Two indeterminate T11 vertebral body body lesion, which may represent atypical lipid poor vertebral hemangioma.  Will need T spine MRI w wo contrast in 4- 6 months.   Patient reports feeling well today. No particular concerns.    Review of Systems  Constitutional: Negative for appetite change, chills, fatigue, fever and unexpected weight change.  HENT:   Negative for hearing loss and voice change.   Eyes: Negative for eye problems and icterus.  Respiratory:  Negative for chest tightness, cough and shortness of breath.   Cardiovascular: Negative for chest pain and leg swelling.  Gastrointestinal: Negative for abdominal distention and abdominal pain.  Endocrine: Negative for hot flashes.  Genitourinary: Negative for difficulty urinating, dysuria and frequency.   Musculoskeletal: Negative for arthralgias.  Skin: Negative for itching and rash.  Neurological: Negative for light-headedness and numbness.  Hematological: Negative for adenopathy. Does not bruise/bleed easily.  Psychiatric/Behavioral: Negative for confusion.    Past Medical History:  Diagnosis Date  . Seizures ([place])    Past Surgical History:  Procedure Laterality Date  . NO PAST SURGERIES      Family History  Problem Relation Age of Onset  . Cancer Neg Hx     Social History   Socioeconomic History  . Marital status: Single    Spouse name: Not on file  . Number of children: Not on file  . Years of education: Not on file  . Highest education level: Not on file  Occupational History  . Not on file  Social Needs  . Financial resource strain: Not on file  . Food insecurity:    Worry: Not on file    Inability: Not on file  . Transportation needs:    Medical: Not on file    Non-medical: Not on file  Tobacco Use  . Smoking status: Current Every Day Smoker    Packs/day: 0.50    Years: 41.00    Pack years: 20.50    Types: Cigarettes  . Smokeless tobacco: Never Used  Substance and Sexual Activity  . Alcohol use: Yes    Alcohol/week: 4.0 standard drinks  Types: 4 Cans of beer per week  . Drug use: Not Currently  . Sexual activity: Not on file  Lifestyle  . Physical activity:    Days per week: Not on file    Minutes per session: Not on file  . Stress: Not on file  Relationships  . Social connections:    Talks on phone: Not on file    Gets together: Not on file    Attends religious service: Not on file    Active member of club or organization: Not on file     Attends meetings of clubs or organizations: Not on file    Relationship status: Not on file  . Intimate partner violence:    Fear of current or ex partner: Not on file    Emotionally abused: Not on file    Physically abused: Not on file    Forced sexual activity: Not on file  Other Topics Concern  . Not on file  Social History Narrative  . Not on file    Current Outpatient Medications on File Prior to Visit  Medication Sig Dispense Refill  . albuterol (VENTOLIN HFA) 108 (90 Base) MCG/ACT inhaler     . levETIRAcetam (KEPPRA) 500 MG tablet Take 500 mg by mouth.    . phenytoin (DILANTIN) 200 MG ER capsule Take 1 capsule (200 mg total) by mouth at bedtime. 30 capsule 0   No current facility-administered medications on file prior to visit.     No Known Allergies     Observations/Objective: There were no vitals filed for this visit. There is no height or weight on file to calculate BMI.  Physical Exam  Constitutional: He is oriented to person, place, and time. No distress.  HENT:  Head: Normocephalic and atraumatic.  Pulmonary/Chest: Effort normal.  Neurological: He is alert and oriented to person, place, and time.  Psychiatric: Affect normal.    I have personally reviewed below laboratory results.  CBC    Component Value Date/Time   WBC 6.2 10/14/2018 1050   RBC 4.98 10/14/2018 1050   RBC 4.98 10/14/2018 1050   HGB 14.1 10/14/2018 1050   HGB 8.3 (L) 08/17/2018 1334   HCT 43.8 10/14/2018 1050   HCT 24.6 (L) 08/17/2018 1334   PLT 141 (L) 10/14/2018 1050   PLT 258 08/17/2018 1334   MCV 88.0 10/14/2018 1050   MCV 80 08/17/2018 1334   MCH 28.3 10/14/2018 1050   MCHC 32.2 10/14/2018 1050   RDW 14.9 10/14/2018 1050   RDW 14.3 08/17/2018 1334   LYMPHSABS 1.5 10/14/2018 1050   LYMPHSABS 1.5 08/17/2018 1334   MONOABS 0.7 10/14/2018 1050   EOSABS 0.3 10/14/2018 1050   EOSABS 0.3 08/17/2018 1334   BASOSABS 0.0 10/14/2018 1050   BASOSABS 0.0 08/17/2018 1334    CMP      Component Value Date/Time   NA 136 10/14/2018 1050   NA 138 08/17/2018 1334   K 4.5 10/14/2018 1050   CL 100 10/14/2018 1050   CO2 25 10/14/2018 1050   GLUCOSE 92 10/14/2018 1050   BUN 6 10/14/2018 1050   BUN 5 (L) 08/17/2018 1334   CREATININE 0.85 10/14/2018 1050   CALCIUM 8.7 (L) 10/14/2018 1050   PROT 7.6 10/14/2018 1050   PROT 5.8 (L) 08/17/2018 1334   ALBUMIN 3.3 (L) 10/14/2018 1050   ALBUMIN 2.4 (L) 08/17/2018 1334   AST 34 10/14/2018 1050   ALT 22 10/14/2018 1050   ALKPHOS 177 (H) 10/14/2018 1050   BILITOT 0.3  10/14/2018 1050   BILITOT 0.3 08/17/2018 1334   GFRNONAA >60 10/14/2018 1050   GFRAA >60 10/14/2018 1050     RADIOGRAPHIC STUDIES: I have personally reviewed the radiological images as listed and agreed with the findings in the report.  Mr Abdomen Mrcp Moise Boring Contast  Result Date: 09/26/2018 CLINICAL DATA:  Fatty liver. Indeterminate liver lesion on recent ultrasound. Abdominal pain for 6 months. EXAM: MRI ABDOMEN WITHOUT AND WITH CONTRAST (INCLUDING MRCP) TECHNIQUE: Multiplanar multisequence MR imaging of the abdomen was performed both before and after the administration of intravenous contrast. Heavily T2-weighted images of the biliary and pancreatic ducts were obtained, and three-dimensional MRCP images were rendered by post processing. CONTRAST:  6 mL Gadavist COMPARISON:  Ultrasound on 07/09/2018 FINDINGS: Lower chest: No acute findings. Hepatobiliary: No evidence of steatosis. Scattered tiny sub-cm cysts are seen in the right and left lobes. There are also 2 sub-cm hemangioma is seen in segments 3 and 8. No other liver masses identified. Gallbladder is unremarkable. No evidence of biliary ductal dilatation. Pancreas:  No mass or inflammatory changes. Spleen:  Within normal limits in size and appearance. Adrenals/Urinary Tract: No masses identified. No evidence of hydronephrosis. Stomach/Bowel: Visualized portion unremarkable. Vascular/Lymphatic: No pathologically  enlarged lymph nodes identified. No abdominal aortic aneurysm. Other:  None. Musculoskeletal: 2 vertebral body bone lesions are seen at T11, which show T2 hyperintensity but T1 hypointensity. These lesions show nonspecific contrast enhancement and no restricted diffusion. These are nonspecific features, but may be seen with atypical lipid poor hemangiomas. IMPRESSION: Small benign hepatic hemangiomas and cysts. No evidence of hepatic malignancy. Two indeterminate T11 vertebral body lesions, which may represent atypical lipid-poor vertebral hemangiomas. Recommend imaging follow-up with T-spine MRI without and with contrast in 4-6 months to assess stability. Electronically Signed   By: Earle Gell M.D.   On: 09/26/2018 10:14    Assessment and Plan: 1. Anemia, unspecified type   2. Increased immunoglobulin   3. Bone lesion   4. Alcohol use   5. Abnormal MRI   6. Thrombocytopenia ([place])   7. Folate deficiency   8. Vitamin B12 deficiency     Labs are reviewed and discussed with patient. Iron panel showed increased iron saturation. CBC showed normocytic anemia with hemoglobin of 8.3,  Recommend to obtain cbc, cmp, hemachromatosis, TSH, retic panel, folate, vitamin B12,  Smear.   Increased immunoglobulin level, will obtain multiple myeloma panel, free light chain ration.  Abnormal MRI findings, T11 vertebral body lesion,  Need to repeat MRI in 3-4 months. Pending above work up.   Alcohol use, liver cirrhosis.  Recommend alcohol cessation. Discussed. Continue follow up with gastroenterology.   Thrombocytopenia, mild. Likely due to alcohol use  Folate and vitamin B12 deficiency, advise patient to start oral folic acid 403JQ daily and Vitamin B12 1036mg daily.  Rx sent to pharmacy.   Follow Up Instructions: 2 weeks to discuss labs.   I discussed the assessment and treatment plan with the patient. The patient was provided an opportunity to ask questions and all were answered. The patient agreed  with the plan and demonstrated an understanding of the instructions.  The patient was advised to call back or seek an in-person evaluation if the symptoms worsen or if the condition fails to improve as anticipated.    ZEarlie Server MD

## 2018-10-19 LAB — HEMOCHROMATOSIS DNA-PCR(C282Y,H63D)

## 2018-10-20 ENCOUNTER — Ambulatory Visit (INDEPENDENT_AMBULATORY_CARE_PROVIDER_SITE_OTHER): Payer: Medicare Other | Admitting: Gastroenterology

## 2018-10-20 ENCOUNTER — Other Ambulatory Visit: Payer: Self-pay

## 2018-10-20 DIAGNOSIS — K7030 Alcoholic cirrhosis of liver without ascites: Secondary | ICD-10-CM

## 2018-10-20 DIAGNOSIS — K2210 Ulcer of esophagus without bleeding: Secondary | ICD-10-CM

## 2018-10-20 NOTE — Progress Notes (Signed)
Wyline Mood , MD [address]  [address]  [place], [place] [address]  Main: [phone]  Fax: [phone]   Primary Care Physician: Oval Linsey, MD  Virtual Visit via Video Note  I connected with patient on 10/20/18 at 11:30 AM EDT by video and verified that I am speaking with the correct person using two identifiers.   I discussed the limitations, risks, security and privacy concerns of performing an evaluation and management service by video  and the availability of in person appointments. I also discussed with the patient that there may be a patient responsible charge related to this service. The patient expressed understanding and agreed to proceed.  Location of Patient: Home Location of Provider: Home Persons involved: Patient and provider only   History of Present Illness: Chief Complaint  Patient presents with  . Follow-up    Cirrhosis of liver, Dysphagia    HPI: Trevor Lambert is a 58 y.o. male   Summary of history :  F/u for liver cirrhosis.   Recent hospital discharge on 07/11/2018, admitted at that point of time with urosepsis and possible aspiration pneumonia and into the ICU with hypotension.  He has a history of alcohol abuse.  Incidentally was found to have cirrhosis of the liver and a 1.7 cm hyperechoic lesion in the right hepatic lobe.  I was also consulted for dysphagia.  He did recall that he had been having difficulty swallowing for a few weeks mostly of solids he mentioned that it did get stuck in his neck denied a similar issue in the past.  I obtained a barium swallow which showed a distal esophageal ulcer along the lateral aspect of indeterminant etiology with underlying esophagitis.  Moderate gastroesophageal reflux was noted and no strictures seen.  I did not wish to pursue with an endoscopy at that point of time as he had been admitted with an aspiration pneumonia.   Interval history   08/16/2018-10/20/2018 10/14/2018: No HFE gene mutation  noted, TSH elevated at 5.78.  CMP shows an albumin of 3.3 total bilirubin of 0.8.  Creatinine of 0.85 and sodium of 136.  Hemoglobin 14.1 g and platelet count of 141.  MCV of 88.  Elevated IgG at 1769.  Very low B12 at 174.  INR 1.0. 08/17/2018: TIBC 65 iron percentage saturation 63%, ferritin 395.  Smooth muscle antibody 10 normal.  Hep A antibody total negative.  HBsAg, HBsAb, hepatitis C antibody, hepatitis B E antibody and antigen, hepatitis B core antibody, HIV, ceruloplasmin, alpha-1 antitrypsin, celiac serology, ANA, were either normal or negative.  09/26/2018: MRI abdomen: Small benign hepatic hemangiomas and cysts noted.  2 indeterminant T11 vertebral body lesions which may represent atypical lipid poor vertebral hemangiomas.  I had referred her to Dr. Cathie Hoops for an elevated IgG and microcytic anemia with normal iron.  I also referred her to see for the abnormal lesion seen on the vertebral column.  Doing ok , still drinking "little bit of alcohol". Swallowing ok. No pain.  He has a bowel movement.   Current Outpatient Medications  Medication Sig Dispense Refill  . albuterol (VENTOLIN HFA) 108 (90 Base) MCG/ACT inhaler     . folic acid (FOLVITE) 400 MCG tablet Take 1 tablet (400 mcg total) by mouth daily. 90 tablet 0  . levETIRAcetam (KEPPRA) 500 MG tablet Take 500 mg by mouth.    . phenytoin (DILANTIN) 200 MG ER capsule Take 1 capsule (200 mg total) by mouth at bedtime. 30 capsule 0  .  simvastatin (ZOCOR) 20 MG tablet     . vitamin B-12 (CYANOCOBALAMIN) 1000 MCG tablet Take 1 tablet (1,000 mcg total) by mouth daily. 90 tablet 0   No current facility-administered medications for this visit.     Allergies as of 10/20/2018  . (No Known Allergies)    Review of Systems:    All systems reviewed and negative except where noted in HPI.  General Appearance:    Alert, cooperative, no distress, appears stated age  Head:    Normocephalic, without obvious abnormality, atraumatic  Eyes:    PERRL,  conjunctiva/corneas clear,  Ears:    Grossly normal hearing    Neurologic:  Grossly normal    Observations/Objective:  Labs: CMP     Component Value Date/Time   NA 136 10/14/2018 1050   NA 138 08/17/2018 1334   K 4.5 10/14/2018 1050   CL 100 10/14/2018 1050   CO2 25 10/14/2018 1050   GLUCOSE 92 10/14/2018 1050   BUN 6 10/14/2018 1050   BUN 5 (L) 08/17/2018 1334   CREATININE 0.85 10/14/2018 1050   CALCIUM 8.7 (L) 10/14/2018 1050   PROT 7.6 10/14/2018 1050   PROT 5.8 (L) 08/17/2018 1334   ALBUMIN 3.3 (L) 10/14/2018 1050   ALBUMIN 2.4 (L) 08/17/2018 1334   AST 34 10/14/2018 1050   ALT 22 10/14/2018 1050   ALKPHOS 177 (H) 10/14/2018 1050   BILITOT 0.3 10/14/2018 1050   BILITOT 0.3 08/17/2018 1334   GFRNONAA >60 10/14/2018 1050   GFRAA >60 10/14/2018 1050   Lab Results  Component Value Date   WBC 6.2 10/14/2018   HGB 14.1 10/14/2018   HCT 43.8 10/14/2018   MCV 88.0 10/14/2018   PLT 141 (L) 10/14/2018    Imaging Studies: Mr Abdomen Mrcp W Wo Contast  Result Date: 09/26/2018 CLINICAL DATA:  Fatty liver. Indeterminate liver lesion on recent ultrasound. Abdominal pain for 6 months. EXAM: MRI ABDOMEN WITHOUT AND WITH CONTRAST (INCLUDING MRCP) TECHNIQUE: Multiplanar multisequence MR imaging of the abdomen was performed both before and after the administration of intravenous contrast. Heavily T2-weighted images of the biliary and pancreatic ducts were obtained, and three-dimensional MRCP images were rendered by post processing. CONTRAST:  6 mL Gadavist COMPARISON:  Ultrasound on 07/09/2018 FINDINGS: Lower chest: No acute findings. Hepatobiliary: No evidence of steatosis. Scattered tiny sub-cm cysts are seen in the right and left lobes. There are also 2 sub-cm hemangioma is seen in segments 3 and 8. No other liver masses identified. Gallbladder is unremarkable. No evidence of biliary ductal dilatation. Pancreas:  No mass or inflammatory changes. Spleen:  Within normal limits in size  and appearance. Adrenals/Urinary Tract: No masses identified. No evidence of hydronephrosis. Stomach/Bowel: Visualized portion unremarkable. Vascular/Lymphatic: No pathologically enlarged lymph nodes identified. No abdominal aortic aneurysm. Other:  None. Musculoskeletal: 2 vertebral body bone lesions are seen at T11, which show T2 hyperintensity but T1 hypointensity. These lesions show nonspecific contrast enhancement and no restricted diffusion. These are nonspecific features, but may be seen with atypical lipid poor hemangiomas. IMPRESSION: Small benign hepatic hemangiomas and cysts. No evidence of hepatic malignancy. Two indeterminate T11 vertebral body lesions, which may represent atypical lipid-poor vertebral hemangiomas. Recommend imaging follow-up with T-spine MRI without and with contrast in 4-6 months to assess stability. Electronically Signed   By: Myles RosenthalJohn  Stahl M.D.   On: 09/26/2018 10:14    Assessment and Plan:   Trevor ComesOdell Lambert is a 58 y.o. y/o male  here today to follow-up for abnormal ultrasound of liver ?  lesion and liver cirrhosis likely secondary to alcohol.Barium for dysphagia  showed distal esophageal ulcer with esophagitis and gastroesophageal reflux.  EGD was deferred at that point of time due to his aspiration pneumonia.  Meld score of 6 in May 2020.  Child Pugh A 6.  Found to have low B12 and folate and on supplementation.  Plan :   1.  Requires hepatitis A/B vaccine. 2.  Stop all alcohol consumption. 3.  Continue on high-dose PPI.  Twice daily 4.  EGD  to determine if the esophageal ulcer has healed and to screen for esophageal varices. 5.  Right upper quadrant ultrasound in 04/07/2019 to screen for [facility].   I discussed the assessment and treatment plan with the patient. The patient was provided an opportunity to ask questions and all were answered. The patient agreed with the plan and demonstrated an understanding of the instructions.   The patient was advised to call back or  seek an in-person evaluation if the symptoms worsen or if the condition fails to improve as anticipated.    Dr Wyline Mood MD,MRCP [facility]) Gastroenterology/Hepatology Pager: [phone]   Speech recognition software was used to dictate this note.

## 2018-10-24 ENCOUNTER — Telehealth: Payer: Self-pay | Admitting: Gastroenterology

## 2018-10-24 NOTE — Telephone Encounter (Signed)
Shelia called in for patient  & wanted to know when to have the covid testing done colonoscopy scheduled for 10-31-2018.

## 2018-10-24 NOTE — Telephone Encounter (Signed)
Rescheduled EGD due to change in Dr. Georgeann Oppenheim schedule patient has been rescheduled to 11/07/18.  COVID on 11/03/18.  Thanks [employer]

## 2018-10-26 ENCOUNTER — Inpatient Hospital Stay: Payer: Medicare Other | Attending: Oncology | Admitting: Oncology

## 2018-10-26 ENCOUNTER — Other Ambulatory Visit: Payer: Self-pay

## 2018-10-26 ENCOUNTER — Encounter: Payer: Self-pay | Admitting: Oncology

## 2018-10-26 VITALS — BP 125/87 | HR 88 | Temp 95.6°F | Resp 18 | Wt 116.0 lb

## 2018-10-26 DIAGNOSIS — D509 Iron deficiency anemia, unspecified: Secondary | ICD-10-CM | POA: Insufficient documentation

## 2018-10-26 DIAGNOSIS — R9389 Abnormal findings on diagnostic imaging of other specified body structures: Secondary | ICD-10-CM

## 2018-10-26 DIAGNOSIS — Z789 Other specified health status: Secondary | ICD-10-CM

## 2018-10-26 DIAGNOSIS — F1010 Alcohol abuse, uncomplicated: Secondary | ICD-10-CM | POA: Insufficient documentation

## 2018-10-26 DIAGNOSIS — Z7289 Other problems related to lifestyle: Secondary | ICD-10-CM

## 2018-10-26 DIAGNOSIS — D696 Thrombocytopenia, unspecified: Secondary | ICD-10-CM

## 2018-10-26 DIAGNOSIS — E538 Deficiency of other specified B group vitamins: Secondary | ICD-10-CM | POA: Insufficient documentation

## 2018-10-26 DIAGNOSIS — D649 Anemia, unspecified: Secondary | ICD-10-CM

## 2018-10-26 NOTE — Progress Notes (Signed)
Hematology/Oncology Follow Up Note [facility]  [phone] [phone]  Patient Care Team: Lucia Gaskins, MD as PCP - General (Internal Medicine)   Name of the patient: Trevor Lambert  [id number]  May 18, 1961   REASON FOR VISIT Discussion of lab work up results.   INTERVAL HISTORY Trevor Lambert is a 58 y.o.[place] who has above oncology history reviewed by me today presented for follow up visit for management of microcytic anemia, bone lesion, alcohol use.  He had lab work up done last week.  Presents to discuss results.  He has no new complaints today.  Still drinking alcohol.  Low folate and vitamin B12 level.  Started on folic acid and vitamin B12 supplementation. Reports feeling "ok".   Review of Systems  Constitutional: Positive for fatigue. Negative for appetite change, chills, fever and unexpected weight change.  HENT:   Negative for hearing loss and voice change.   Eyes: Negative for eye problems and icterus.  Respiratory: Negative for chest tightness, cough and shortness of breath.   Cardiovascular: Negative for chest pain and leg swelling.  Gastrointestinal: Negative for abdominal distention and abdominal pain.  Endocrine: Negative for hot flashes.  Genitourinary: Negative for difficulty urinating, dysuria and frequency.   Musculoskeletal: Negative for arthralgias.  Skin: Negative for itching and rash.  Neurological: Negative for light-headedness and numbness.  Hematological: Negative for adenopathy. Does not bruise/bleed easily.  Psychiatric/Behavioral: Negative for confusion.      No Known Allergies   Past Medical History:  Diagnosis Date  . Seizures ([place])      Past Surgical History:  Procedure Laterality Date  . NO PAST SURGERIES      Social History   Socioeconomic History  . Marital status: Single    Spouse name: Not on file  . Number of children: Not on file  . Years of education: Not on file  .  Highest education level: Not on file  Occupational History  . Not on file  Social Needs  . Financial resource strain: Not on file  . Food insecurity:    Worry: Not on file    Inability: Not on file  . Transportation needs:    Medical: Not on file    Non-medical: Not on file  Tobacco Use  . Smoking status: Current Every Day Smoker    Packs/day: 0.50    Years: 41.00    Pack years: 20.50    Types: Cigarettes  . Smokeless tobacco: Never Used  Substance and Sexual Activity  . Alcohol use: Yes    Alcohol/week: 4.0 standard drinks    Types: 4 Cans of beer per week  . Drug use: Not Currently  . Sexual activity: Not on file  Lifestyle  . Physical activity:    Days per week: Not on file    Minutes per session: Not on file  . Stress: Not on file  Relationships  . Social connections:    Talks on phone: Not on file    Gets together: Not on file    Attends religious service: Not on file    Active member of club or organization: Not on file    Attends meetings of clubs or organizations: Not on file    Relationship status: Not on file  . Intimate partner violence:    Fear of current or ex partner: Not on file    Emotionally abused: Not on file    Physically abused: Not on file    Forced sexual activity: Not  on file  Other Topics Concern  . Not on file  Social History Narrative  . Not on file    Family History  Problem Relation Age of Onset  . Cancer Neg Hx      Current Outpatient Medications:  .  albuterol (VENTOLIN HFA) 108 (90 Base) MCG/ACT inhaler, , Disp: , Rfl:  .  folic acid (FOLVITE) 914 MCG tablet, Take 1 tablet (400 mcg total) by mouth daily., Disp: 90 tablet, Rfl: 0 .  levETIRAcetam (KEPPRA) 500 MG tablet, Take 500 mg by mouth., Disp: , Rfl:  .  phenytoin (DILANTIN) 200 MG ER capsule, Take 1 capsule (200 mg total) by mouth at bedtime., Disp: 30 capsule, Rfl: 0 .  simvastatin (ZOCOR) 20 MG tablet, , Disp: , Rfl:  .  vitamin B-12 (CYANOCOBALAMIN) 1000 MCG tablet,  Take 1 tablet (1,000 mcg total) by mouth daily., Disp: 90 tablet, Rfl: 0  Physical exam: ECOG 1 Vitals:   10/26/18 1417  BP: 125/87  Pulse: 88  Resp: 18  Temp: (!) 95.6 F (35.3 C)  Weight: 116 lb (52.6 kg)   Physical Exam Constitutional:      General: He is not in acute distress. HENT:     Head: Normocephalic and atraumatic.  Eyes:     General: No scleral icterus.    Pupils: Pupils are equal, round, and reactive to light.  Neck:     Musculoskeletal: Normal range of motion and neck supple.  Cardiovascular:     Rate and Rhythm: Normal rate and regular rhythm.     Heart sounds: Normal heart sounds.  Pulmonary:     Effort: Pulmonary effort is normal. No respiratory distress.     Breath sounds: No wheezing.  Abdominal:     General: Bowel sounds are normal. There is no distension.     Palpations: Abdomen is soft. There is no mass.     Tenderness: There is no abdominal tenderness.  Musculoskeletal: Normal range of motion.        General: No deformity.  Skin:    General: Skin is warm and dry.     Findings: No erythema or rash.  Neurological:     Mental Status: He is alert and oriented to person, place, and time.     Cranial Nerves: No cranial nerve deficit.     Coordination: Coordination normal.  Psychiatric:        Behavior: Behavior normal.        Thought Content: Thought content normal.     CMP Latest Ref Rng & Units 10/14/2018  Glucose 70 - 99 mg/dL 92  BUN 6 - 20 mg/dL 6  Creatinine 0.61 - 1.24 mg/dL 0.85  Sodium 135 - 145 mmol/L 136  Potassium 3.5 - 5.1 mmol/L 4.5  Chloride 98 - 111 mmol/L 100  CO2 22 - 32 mmol/L 25  Calcium 8.9 - 10.3 mg/dL 8.7(L)  Total Protein 6.5 - 8.1 g/dL 7.6  Total Bilirubin 0.3 - 1.2 mg/dL 0.3  Alkaline Phos 38 - 126 U/L 177(H)  AST 15 - 41 U/L 34  ALT 0 - 44 U/L 22   CBC Latest Ref Rng & Units 10/14/2018  WBC 4.0 - 10.5 K/uL 6.2  Hemoglobin 13.0 - 17.0 g/dL 14.1  Hematocrit 39.0 - 52.0 % 43.8  Platelets 150 - 400 K/uL 141(L)     No results found.   Assessment and plan   1. Abnormal MRI   2. Alcohol use   3. Anemia, unspecified type   4.  Vitamin B12 deficiency   5. Folate deficiency   6. Thrombocytopenia ([facility])    Labs are reviewed and discussed with patient. CBC showed normalization of hemoglobin to 14.1. Mild thrombocytopenia 141.  Likely secondary to chronic alcohol use.  Alcohol cessation discussed with patient.  He is motivated.    Multiple myeloma panel nonremarkable.  No M spike, normal free light chain ratio. Polyclonal increase detected in 1 or more IgM.  Low folate and vitamin B12 level, consistent with chronic alcohol use. Patient has been started on folic acid and vitamin B12 supplementation. We will repeat blood work in 3 months.   #Abdominal MRI, repeat thoracic spine with and without contrast in 3 months.  Orders Placed This Encounter  Procedures  . MR Thoracic Spine W Wo Contrast    Standing Status:   Future    Standing Expiration Date:   10/26/2019    Order Specific Question:   ** REASON FOR EXAM (FREE TEXT)    Answer:   T-spine lesion    Order Specific Question:   GRA to provide read?    Answer:   Yes    Order Specific Question:   If indicated for the ordered procedure, I authorize the administration of contrast media per Radiology protocol    Answer:   Yes    Order Specific Question:   What is the patient's sedation requirement?    Answer:   No Sedation    Order Specific Question:   Use SRS Protocol?    Answer:   Yes    Order Specific Question:   Does the patient have a pacemaker or implanted devices?    Answer:   No    Order Specific Question:   Preferred imaging location?    Answer:   [facility] (table limit-400lbs)    Order Specific Question:   Radiology Contrast Protocol - do NOT remove file path    Answer:   _0 [email or web address]      Follow-up in 3 months.  Earlie Server, MD, PhD Hematology Oncology [facility] at [facility] Pager- [phone] 10/26/2018

## 2018-10-26 NOTE — Progress Notes (Signed)
Patient here for follow up. No concerns voiced.  °

## 2018-11-03 ENCOUNTER — Other Ambulatory Visit
Admission: RE | Admit: 2018-11-03 | Discharge: 2018-11-03 | Disposition: A | Discharge status: Home / Self Care | Payer: Medicare Other | Source: Ambulatory Visit | Attending: Gastroenterology | Admitting: Gastroenterology

## 2018-11-03 ENCOUNTER — Other Ambulatory Visit: Payer: Self-pay

## 2018-11-03 DIAGNOSIS — Z1159 Encounter for screening for other viral diseases: Secondary | ICD-10-CM | POA: Insufficient documentation

## 2018-11-04 LAB — NOVEL CORONAVIRUS, NAA (HOSP ORDER, SEND-OUT TO REF LAB; TAT 18-24 HRS): SARS-CoV-2, NAA: NOT DETECTED

## 2018-11-05 ENCOUNTER — Encounter: Payer: Self-pay | Admitting: Anesthesiology

## 2018-11-07 ENCOUNTER — Encounter: Payer: Self-pay | Admitting: *Deleted

## 2018-11-07 ENCOUNTER — Ambulatory Visit: Payer: Medicare Other | Admitting: Anesthesiology

## 2018-11-07 ENCOUNTER — Ambulatory Visit
Admission: RE | Admit: 2018-11-07 | Discharge: 2018-11-07 | Disposition: A | Discharge status: Home / Self Care | Payer: Medicare Other | Attending: Gastroenterology | Admitting: Gastroenterology

## 2018-11-07 ENCOUNTER — Encounter
Admission: RE | Disposition: A | Discharge status: Home / Self Care | Payer: Self-pay | Source: Home / Self Care | Attending: Gastroenterology

## 2018-11-07 ENCOUNTER — Other Ambulatory Visit: Payer: Self-pay

## 2018-11-07 DIAGNOSIS — F17210 Nicotine dependence, cigarettes, uncomplicated: Secondary | ICD-10-CM | POA: Diagnosis not present

## 2018-11-07 DIAGNOSIS — K2950 Unspecified chronic gastritis without bleeding: Secondary | ICD-10-CM | POA: Insufficient documentation

## 2018-11-07 DIAGNOSIS — R569 Unspecified convulsions: Secondary | ICD-10-CM | POA: Diagnosis not present

## 2018-11-07 DIAGNOSIS — K2970 Gastritis, unspecified, without bleeding: Secondary | ICD-10-CM | POA: Diagnosis not present

## 2018-11-07 DIAGNOSIS — K7460 Unspecified cirrhosis of liver: Secondary | ICD-10-CM | POA: Insufficient documentation

## 2018-11-07 DIAGNOSIS — K2210 Ulcer of esophagus without bleeding: Secondary | ICD-10-CM

## 2018-11-07 DIAGNOSIS — Z79899 Other long term (current) drug therapy: Secondary | ICD-10-CM | POA: Diagnosis not present

## 2018-11-07 HISTORY — PX: ESOPHAGOGASTRODUODENOSCOPY (EGD) WITH PROPOFOL: SHX5813

## 2018-11-07 SURGERY — ESOPHAGOGASTRODUODENOSCOPY (EGD) WITH PROPOFOL
Anesthesia: General

## 2018-11-07 MED ORDER — GLYCOPYRROLATE 0.2 MG/ML IJ SOLN
INTRAMUSCULAR | Status: DC | PRN
  Administered 2018-11-07: 0.2 mg via INTRAVENOUS

## 2018-11-07 MED ORDER — LIDOCAINE HCL (PF) 2 % IJ SOLN
INTRAMUSCULAR | Status: AC
  Filled 2018-11-07: qty 10

## 2018-11-07 MED ORDER — GLYCOPYRROLATE 0.2 MG/ML IJ SOLN
INTRAMUSCULAR | Status: AC
  Filled 2018-11-07: qty 1

## 2018-11-07 MED ORDER — SODIUM CHLORIDE 0.9 % IV SOLN
INTRAVENOUS | Status: DC
  Administered 2018-11-07: 09:00:00 via INTRAVENOUS

## 2018-11-07 MED ORDER — PROPOFOL 10 MG/ML IV BOLUS
INTRAVENOUS | Status: DC | PRN
  Administered 2018-11-07: 70 mg via INTRAVENOUS

## 2018-11-07 MED ORDER — FENTANYL CITRATE (PF) 100 MCG/2ML IJ SOLN
INTRAMUSCULAR | Status: AC
  Filled 2018-11-07: qty 2

## 2018-11-07 MED ORDER — LIDOCAINE HCL (CARDIAC) PF 100 MG/5ML IV SOSY
PREFILLED_SYRINGE | INTRAVENOUS | Status: DC | PRN
  Administered 2018-11-07: 60 mg via INTRAVENOUS

## 2018-11-07 MED ORDER — PROPOFOL 500 MG/50ML IV EMUL
INTRAVENOUS | Status: DC | PRN
  Administered 2018-11-07: 140 ug/kg/min via INTRAVENOUS

## 2018-11-07 MED ORDER — FENTANYL CITRATE (PF) 100 MCG/2ML IJ SOLN
INTRAMUSCULAR | Status: DC | PRN
  Administered 2018-11-07: 25 ug via INTRAVENOUS

## 2018-11-07 NOTE — Anesthesia Preprocedure Evaluation (Addendum)
Anesthesia Evaluation  Patient identified by MRN, date of birth, ID band Patient awake    Reviewed: Allergy & Precautions, NPO status , Patient's Chart, lab work & pertinent test results, reviewed documented beta blocker date and time   Airway Mallampati: II  TM Distance: >3 FB     Dental  (+) Chipped   Pulmonary Current Smoker,           Cardiovascular      Neuro/Psych Seizures -,  PSYCHIATRIC DISORDERS    GI/Hepatic   Endo/Other    Renal/GU      Musculoskeletal   Abdominal   Peds  Hematology   Anesthesia Other Findings ETOH. Dec platelets. Hx of PVCs.  Reproductive/Obstetrics                             Anesthesia Physical Anesthesia Plan  ASA: III  Anesthesia Plan: General   Post-op Pain Management:    Induction: Intravenous  PONV Risk Score and Plan:   Airway Management Planned:   Additional Equipment:   Intra-op Plan:   Post-operative Plan:   Informed Consent: I have reviewed the patients History and Physical, chart, labs and discussed the procedure including the risks, benefits and alternatives for the proposed anesthesia with the patient or authorized representative who has indicated his/her understanding and acceptance.       Plan Discussed with: CRNA  Anesthesia Plan Comments:         Anesthesia Quick Evaluation

## 2018-11-07 NOTE — Anesthesia Post-op Follow-up Note (Signed)
Anesthesia QCDR form completed.        

## 2018-11-07 NOTE — Transfer of Care (Signed)
Immediate Anesthesia Transfer of Care Note  Patient: Trevor Lambert  Procedure(s) Performed: ESOPHAGOGASTRODUODENOSCOPY (EGD) WITH PROPOFOL (N/A )  Patient Location: PACU  Anesthesia Type:General  Level of Consciousness: sedated  Airway & Oxygen Therapy: Patient Spontanous Breathing and Patient connected to face mask oxygen  Post-op Assessment: Report given to RN and Post -op Vital signs reviewed and stable  Post vital signs: Reviewed and stable  Last Vitals:  Vitals Value Taken Time  BP 85/62 11/07/18 0918  Temp 36.3 C 11/07/18 0918  Pulse 80 11/07/18 0918  Resp 13 11/07/18 0918  SpO2 100 % 11/07/18 0918  Vitals shown include unvalidated device data.  Last Pain:  Vitals:   11/07/18 0916  TempSrc: Tympanic  PainSc: Asleep         Complications: No apparent anesthesia complications

## 2018-11-07 NOTE — Op Note (Signed)
[facility] Gastroenterology Patient Name: Trevor Lambert Procedure Date: 11/07/2018 9:00 AM MRN: [id number] Account #: [id number] Date of Birth: 1960/10/11 Admit Type: Outpatient Age: 58 Room: [facility] ENDO ROOM 4 Gender: Male Note Status: Finalized Procedure:            Upper GI endoscopy Indications:          Cirrhosis rule out esophageal varices Providers:            Jonathon Bellows MD, MD Referring MD:         Ralene Bathe. Cindie Laroche, MD (Referring MD) Medicines:            Monitored Anesthesia Care Complications:        No immediate complications. Procedure:            Pre-Anesthesia Assessment:                       - Prior to the procedure, a History and Physical was                        performed, and patient medications, allergies and                        sensitivities were reviewed. The patient's tolerance of                        previous anesthesia was reviewed.                       - The risks and benefits of the procedure and the                        sedation options and risks were discussed with the                        patient. All questions were answered and informed                        consent was obtained.                       - ASA Grade Assessment: III - A patient with severe                        systemic disease.                       After obtaining informed consent, the endoscope was                        passed under direct vision. Throughout the procedure,                        the patient's blood pressure, pulse, and oxygen                        saturations were monitored continuously. The Endoscope                        was introduced through the mouth, and advanced to the  third part of duodenum. The upper GI endoscopy was                        accomplished with ease. The patient tolerated the                        procedure well. Findings:      The examined esophagus was normal.      The examined  duodenum was normal.      Diffuse moderate inflammation characterized by congestion (edema),       erosions and erythema was found in the entire examined stomach. Biopsies       were taken with a cold forceps for histology.      The cardia and gastric fundus were normal on retroflexion. Impression:           - Normal esophagus.                       - Normal examined duodenum.                       - Gastritis. Biopsied. Recommendation:       - Await pathology results.                       - Discharge patient to home (with escort).                       - Resume previous diet.                       - Continue present medications.                       - Return to my office as previously scheduled.                       - Repeat upper endoscopy in 3 years for surveillance. Procedure Code(s):    --- Professional ---                       [id number], Esophagogastroduodenoscopy, flexible, transoral;                        with biopsy, single or multiple Diagnosis Code(s):    --- Professional ---                       K29.70, Gastritis, unspecified, without bleeding                       K74.60, Unspecified cirrhosis of liver CPT copyright 2019 American Medical Association. All rights reserved. The codes documented in this report are preliminary and upon coder review may  be revised to meet current compliance requirements. Wyline MoodKiran Finnleigh Marchetti, MD Wyline MoodKiran Shayaan Parke MD, MD 11/07/2018 9:15:00 AM This report has been signed electronically. Number of Addenda: 0 Note Initiated On: 11/07/2018 9:00 AM Estimated Blood Loss: Estimated blood loss: none.      [facility]

## 2018-11-07 NOTE — Anesthesia Postprocedure Evaluation (Signed)
Anesthesia Post Note  Patient: Trevor Lambert  Procedure(s) Performed: ESOPHAGOGASTRODUODENOSCOPY (EGD) WITH PROPOFOL (N/A )  Patient location during evaluation: Endoscopy Anesthesia Type: General Level of consciousness: awake and alert Pain management: pain level controlled Vital Signs Assessment: post-procedure vital signs reviewed and stable Respiratory status: spontaneous breathing, nonlabored ventilation, respiratory function stable and patient connected to nasal cannula oxygen Cardiovascular status: blood pressure returned to baseline and stable Postop Assessment: no apparent nausea or vomiting Anesthetic complications: no     Last Vitals:  Vitals:   11/07/18 0937 11/07/18 0946  BP: 104/76 107/80  Pulse: 77 72  Resp: 18 13  Temp:    SpO2: 100% 98%    Last Pain:  Vitals:   11/07/18 0937  TempSrc:   PainSc: 0-No pain                 Custer Pimenta S

## 2018-11-07 NOTE — H&P (Signed)
Trevor MoodKiran Lathon Adan, MD [address], [address], [place], [place], [address] [address], [place], [place], [address] Phone: [phone]  Fax: [phone]  Primary Care Physician:  Oval Linseyondiego, Richard, MD   Pre-Procedure History & Physical: HPI:  Trevor Lambert is a 58 y.o. male is here for an endoscopy    Past Medical History:  Diagnosis Date  . Seizures (HCC)     Past Surgical History:  Procedure Laterality Date  . NO PAST SURGERIES      Prior to Admission medications   Medication Sig Start Date End Date Taking? Authorizing Provider  folic acid (FOLVITE) 400 MCG tablet Take 1 tablet (400 mcg total) by mouth daily. 10/18/18  Yes Rickard PatienceYu, Zhou, MD  levETIRAcetam (KEPPRA) 500 MG tablet Take 500 mg by mouth. 07/08/18  Yes [provider]  phenytoin (DILANTIN) 200 MG ER capsule Take 1 capsule (200 mg total) by mouth at bedtime. 07/12/18  Yes Ojie, Jude, MD  simvastatin (ZOCOR) 20 MG tablet  10/13/18  Yes [provider]  vitamin B-12 (CYANOCOBALAMIN) 1000 MCG tablet Take 1 tablet (1,000 mcg total) by mouth daily. 10/18/18  Yes Rickard PatienceYu, Zhou, MD  albuterol (VENTOLIN HFA) 108 [phone](90 Base) MCG/ACT inhaler  07/20/18   [provider]    Allergies as of 10/20/2018  . (No Known Allergies)    Family History  Problem Relation Age of Onset  . Cancer Neg Hx     Social History   Socioeconomic History  . Marital status: Single    Spouse name: Not on file  . Number of children: Not on file  . Years of education: Not on file  . Highest education level: Not on file  Occupational History  . Not on file  Social Needs  . Financial resource strain: Not on file  . Food insecurity    Worry: Not on file    Inability: Not on file  . Transportation needs    Medical: Not on file    Non-medical: Not on file  Tobacco Use  . Smoking status: Current Every Day Smoker    Packs/day: 0.50    Years: 41.00    Pack years: 20.50    Types: Cigarettes  . Smokeless tobacco: Never  Used  Substance and Sexual Activity  . Alcohol use: Yes    Alcohol/week: 4.0 standard drinks    Types: 4 Cans of beer per week  . Drug use: Not Currently  . Sexual activity: Not on file  Lifestyle  . Physical activity    Days per week: Not on file    Minutes per session: Not on file  . Stress: Not on file  Relationships  . Social Musicianconnections    Talks on phone: Not on file    Gets together: Not on file    Attends religious service: Not on file    Active member of club or organization: Not on file    Attends meetings of clubs or organizations: Not on file    Relationship status: Not on file  . Intimate partner violence    Fear of current or ex partner: Not on file    Emotionally abused: Not on file    Physically abused: Not on file    Forced sexual activity: Not on file  Other Topics Concern  . Not on file  Social History Narrative  . Not on file    Review of Systems: See HPI, otherwise negative ROS  Physical Exam: BP 118/84   Pulse  75   Temp 97.8 F (36.6 C) (Oral)   Resp 18   Ht 5\' 7"  (1.702 m)   BMI 18.17 kg/m  General:   Alert,  pleasant and cooperative in NAD Head:  Normocephalic and atraumatic. Neck:  Supple; no masses or thyromegaly. Lungs:  Clear throughout to auscultation, normal respiratory effort.    Heart:  +S1, +S2, Regular rate and rhythm, No edema. Abdomen:  Soft, nontender and nondistended. Normal bowel sounds, without guarding, and without rebound.   Neurologic:  Alert and  oriented x4;  grossly normal neurologically.  Impression/Plan: Trevor Lambert is here for an endoscopy  to be performed for  evaluation of esophageal varices    Risks, benefits, limitations, and alternatives regarding endoscopy have been reviewed with the patient.  Questions have been answered.  All parties agreeable.   Jonathon Bellows, MD  11/07/2018, 8:58 AM

## 2018-11-08 ENCOUNTER — Encounter: Payer: Self-pay | Admitting: Gastroenterology

## 2018-11-09 LAB — SURGICAL PATHOLOGY

## 2018-11-10 ENCOUNTER — Encounter: Payer: Self-pay | Admitting: Gastroenterology

## 2019-01-24 ENCOUNTER — Ambulatory Visit (INDEPENDENT_AMBULATORY_CARE_PROVIDER_SITE_OTHER): Payer: Medicare Other | Admitting: Gastroenterology

## 2019-01-24 DIAGNOSIS — K7030 Alcoholic cirrhosis of liver without ascites: Secondary | ICD-10-CM

## 2019-01-24 NOTE — Progress Notes (Signed)
Trevor Lambert , MD [address]  [place], [place] [address]  Main: [phone]  Fax: [phone]   Primary Care Physician: Lucia Gaskins, MD  Virtual Visit via Video Note  I connected with patient on 01/24/19 at  1:15 PM EDT by video and verified that I am speaking with the correct person using two identifiers.   I discussed the limitations, risks, security and privacy concerns of performing an evaluation and management service by video  and the availability of in person appointments. I also discussed with the patient that there may be a patient responsible charge related to this service. The patient expressed understanding and agreed to proceed.  Location of Patient: Home Location of Provider: Home Persons involved: Patient and provider only   History of Present Illness: F/u cirrhosis of the liver   HPI: Trevor Lambert is a 58 y.o. male  Summary of history : He has a history of alcohol abuse. Hospital discharge on 07/11/2018,  with urosepsis and possible aspiration pneumonia and into the ICU with hypotension. Incidentally was found to have cirrhosis of the liver and a 1.7 cm hyperechoic lesion in the right hepatic lobe. I was also consulted for dysphagia. He did recall that he had been having difficulty swallowing for a few weeks mostly of solids he mentioned that it did get stuck in his neck denied a similar issue in the past. I obtained a barium swallow which showed a distal esophageal ulcer along the lateral aspect of indeterminant etiology with underlying esophagitis. Moderate gastroesophageal reflux was noted and no strictures seen. I did not wish to pursue with an endoscopy at that point of time as he had been admitted with an aspiration pneumonia.  10/14/2018: No HFE gene mutation noted, TSH elevated at 5.78.  CMP shows an albumin of 3.3 total bilirubin of 0.8.  Creatinine of 0.85 and sodium of 136.  Hemoglobin 14.1 g and platelet count of 141.  MCV of  88.  Elevated IgG at 1769.  Very low B12 at 174.  INR 1.0. 08/17/2018: TIBC 65 iron percentage saturation 63%, ferritin 395.  Smooth muscle antibody 10 normal.  Hep A antibody total negative.  HBsAg, HBsAb, hepatitis C antibody, hepatitis B E antibody and antigen, hepatitis B core antibody, HIV, ceruloplasmin, alpha-1 antitrypsin, celiac serology, ANA, were either normal or negative.  09/26/2018: MRI abdomen: Small benign hepatic hemangiomas and cysts noted.  2 indeterminant T11 vertebral body lesions which may represent atypical lipid poor vertebral hemangiomas.   Interval history6/08/2018-01/24/2019  Seen Dr Tasia Catchings for  elevated IgG and microcytic anemia with normal iron, low b12 ,abnormal lesion seen on the vertebral column.Placed on b12 replacement, myeloma panel negative .    11/07/2018: EGD: chronic inactive gastritis- no esophageal varices.  Still drinking alcohol strongly advised him to stop.  Denies any other complaints.  No confusion.    Current Outpatient Medications  Medication Sig Dispense Refill  . albuterol (VENTOLIN HFA) 108 (90 Base) MCG/ACT inhaler     . folic acid (FOLVITE) 338 MCG tablet Take 1 tablet (400 mcg total) by mouth daily. 90 tablet 0  . levETIRAcetam (KEPPRA) 500 MG tablet Take 500 mg by mouth.    . phenytoin (DILANTIN) 200 MG ER capsule Take 1 capsule (200 mg total) by mouth at bedtime. 30 capsule 0  . simvastatin (ZOCOR) 20 MG tablet     . vitamin B-12 (CYANOCOBALAMIN) 1000 MCG tablet Take 1 tablet (1,000 mcg total) by mouth daily. 90 tablet 0  No current facility-administered medications for this visit.     Allergies as of 01/24/2019  . (No Known Allergies)    Review of Systems:    All systems reviewed and negative except where noted in HPI.  General Appearance:    Alert, cooperative, no distress, appears stated age  Head:    Normocephalic, without obvious abnormality, atraumatic  Eyes:    PERRL, conjunctiva/corneas clear,  Ears:    Grossly normal  hearing    Neurologic:  Grossly normal    Observations/Objective:  Labs: CMP     Component Value Date/Time   NA 136 10/14/2018 1050   NA 138 08/17/2018 1334   K 4.5 10/14/2018 1050   CL 100 10/14/2018 1050   CO2 25 10/14/2018 1050   GLUCOSE 92 10/14/2018 1050   BUN 6 10/14/2018 1050   BUN 5 (L) 08/17/2018 1334   CREATININE 0.85 10/14/2018 1050   CALCIUM 8.7 (L) 10/14/2018 1050   PROT 7.6 10/14/2018 1050   PROT 5.8 (L) 08/17/2018 1334   ALBUMIN 3.3 (L) 10/14/2018 1050   ALBUMIN 2.4 (L) 08/17/2018 1334   AST 34 10/14/2018 1050   ALT 22 10/14/2018 1050   ALKPHOS 177 (H) 10/14/2018 1050   BILITOT 0.3 10/14/2018 1050   BILITOT 0.3 08/17/2018 1334   GFRNONAA >60 10/14/2018 1050   GFRAA >60 10/14/2018 1050   Lab Results  Component Value Date   WBC 6.2 10/14/2018   HGB 14.1 10/14/2018   HCT 43.8 10/14/2018   MCV 88.0 10/14/2018   PLT 141 (L) 10/14/2018    Imaging Studies: No results found.  Assessment and Plan:   Trevor Lambert is a 58 y.o. 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  Meld score of 6 in May 2020.  Child Pugh A 6.  Found to have low B12 and folate and on supplementation.  Plan :  1. Requires hepatitis A/B vaccine. 2. Stop all alcohol consumption. 3. Continue on high-dose PPI. Twice daily 4. EGD  to screen for varices in 10/2021 5.  Right upper quadrant ultrasound in 04/07/2019 to screen for [facility]. 6.  Check meld score with labs  Follow-up in 3 months   I discussed the assessment and treatment plan with the patient. The patient was provided an opportunity to ask questions and all were answered. The patient agreed with the plan and demonstrated an understanding of the instructions.   The patient was advised to call back or seek an in-person evaluation if the symptoms worsen or if the condition fails to improve as anticipated.  Dr Wyline MoodKiran Aviv Rota MD,MRCP [facility]([place]) Gastroenterology/Hepatology Pager: [phone]    Speech recognition software was used to dictate this note.

## 2019-01-26 ENCOUNTER — Other Ambulatory Visit: Payer: Self-pay

## 2019-01-27 ENCOUNTER — Ambulatory Visit: Payer: Medicare Other

## 2019-01-27 ENCOUNTER — Inpatient Hospital Stay: Payer: Medicare Other | Attending: Oncology

## 2019-01-27 ENCOUNTER — Other Ambulatory Visit: Payer: Self-pay

## 2019-01-27 DIAGNOSIS — D696 Thrombocytopenia, unspecified: Secondary | ICD-10-CM | POA: Insufficient documentation

## 2019-01-27 DIAGNOSIS — Z7289 Other problems related to lifestyle: Secondary | ICD-10-CM

## 2019-01-27 DIAGNOSIS — Z79899 Other long term (current) drug therapy: Secondary | ICD-10-CM | POA: Diagnosis not present

## 2019-01-27 DIAGNOSIS — F17210 Nicotine dependence, cigarettes, uncomplicated: Secondary | ICD-10-CM | POA: Diagnosis not present

## 2019-01-27 DIAGNOSIS — D509 Iron deficiency anemia, unspecified: Secondary | ICD-10-CM | POA: Diagnosis not present

## 2019-01-27 DIAGNOSIS — E538 Deficiency of other specified B group vitamins: Secondary | ICD-10-CM | POA: Diagnosis not present

## 2019-01-27 DIAGNOSIS — Z789 Other specified health status: Secondary | ICD-10-CM

## 2019-01-27 DIAGNOSIS — F1090 Alcohol use, unspecified, uncomplicated: Secondary | ICD-10-CM

## 2019-01-27 LAB — CBC WITH DIFFERENTIAL/PLATELET
Abs Immature Granulocytes: 0.02 10*3/uL (ref 0.00–0.07)
Basophils Absolute: 0 10*3/uL (ref 0.0–0.1)
Basophils Relative: 1 %
Eosinophils Absolute: 0.1 10*3/uL (ref 0.0–0.5)
Eosinophils Relative: 1 %
HCT: 32.2 % — ABNORMAL LOW (ref 39.0–52.0)
Hemoglobin: 10.7 g/dL — ABNORMAL LOW (ref 13.0–17.0)
Immature Granulocytes: 0 %
Lymphocytes Relative: 25 %
Lymphs Abs: 1.5 10*3/uL (ref 0.7–4.0)
MCH: 28.2 pg (ref 26.0–34.0)
MCHC: 33.2 g/dL (ref 30.0–36.0)
MCV: 84.7 fL (ref 80.0–100.0)
Monocytes Absolute: 0.7 10*3/uL (ref 0.1–1.0)
Monocytes Relative: 11 %
Neutro Abs: 3.8 10*3/uL (ref 1.7–7.7)
Neutrophils Relative %: 62 %
Platelets: 163 10*3/uL (ref 150–400)
RBC: 3.8 MIL/uL — ABNORMAL LOW (ref 4.22–5.81)
RDW: 18 % — ABNORMAL HIGH (ref 11.5–15.5)
WBC: 6.1 10*3/uL (ref 4.0–10.5)
nRBC: 0 % (ref 0.0–0.2)

## 2019-01-27 LAB — FOLATE: Folate: 5.3 ng/mL — ABNORMAL LOW (ref >5.9)

## 2019-01-27 LAB — VITAMIN B12: Vitamin B-12: 167 pg/mL — ABNORMAL LOW (ref 180–914)

## 2019-02-02 ENCOUNTER — Other Ambulatory Visit: Payer: Self-pay

## 2019-02-02 ENCOUNTER — Encounter: Payer: Self-pay | Admitting: Oncology

## 2019-02-02 NOTE — Progress Notes (Signed)
Patient pre screened for office appointment, no questions or concerns today.Patient not available for phone call sister-in-law provided information.

## 2019-02-03 ENCOUNTER — Other Ambulatory Visit: Payer: Self-pay

## 2019-02-03 ENCOUNTER — Inpatient Hospital Stay (HOSPITAL_BASED_OUTPATIENT_CLINIC_OR_DEPARTMENT_OTHER): Payer: Medicare Other | Admitting: Oncology

## 2019-02-03 ENCOUNTER — Encounter: Payer: Self-pay | Admitting: Oncology

## 2019-02-03 VITALS — BP 124/83 | HR 83 | Temp 96.9°F | Resp 16 | Wt 124.0 lb

## 2019-02-03 DIAGNOSIS — D649 Anemia, unspecified: Secondary | ICD-10-CM | POA: Diagnosis not present

## 2019-02-03 DIAGNOSIS — Z789 Other specified health status: Secondary | ICD-10-CM

## 2019-02-03 DIAGNOSIS — E538 Deficiency of other specified B group vitamins: Secondary | ICD-10-CM

## 2019-02-03 DIAGNOSIS — Z7289 Other problems related to lifestyle: Secondary | ICD-10-CM | POA: Diagnosis not present

## 2019-02-03 DIAGNOSIS — M899 Disorder of bone, unspecified: Secondary | ICD-10-CM | POA: Diagnosis not present

## 2019-02-03 HISTORY — DX: Deficiency of other specified B group vitamins: E53.8

## 2019-02-03 NOTE — Progress Notes (Signed)
Hematology/Oncology Follow Up Note [facility]  [phone] [phone]  Patient Care Team: Lucia Gaskins, MD as PCP - General (Internal Medicine)   Name of the patient: Trevor Lambert  [id number]  [birth year]   REASON FOR VISIT  follow-up for anemia and thrombocytopenia, vitamin F81 and folic acid deficiency.  INTERVAL HISTORY Trevor Lambert is a 58 y.o.[place] who has above oncology history reviewed by me today presented for follow up visit for management of microcytic anemia, bone lesion, alcohol use.  Patient reports feeling well. Denies any new complaints today. Chronic alcohol use, still drinks alcohol, 1 beer a day.   He has a history of low vitamin B12 and folate level.  He claims that he is taking oral vitamin B12 supplementation and folic acid supplementation Chronic fatigue, unchanged.  Review of Systems  Constitutional: Positive for fatigue. Negative for appetite change, chills, fever and unexpected weight change.  HENT:   Negative for hearing loss and voice change.   Eyes: Negative for eye problems and icterus.  Respiratory: Negative for chest tightness, cough and shortness of breath.   Cardiovascular: Negative for chest pain and leg swelling.  Gastrointestinal: Negative for abdominal distention and abdominal pain.  Endocrine: Negative for hot flashes.  Genitourinary: Negative for difficulty urinating, dysuria and frequency.   Musculoskeletal: Negative for arthralgias.  Skin: Negative for itching and rash.  Neurological: Negative for light-headedness and numbness.  Hematological: Negative for adenopathy. Does not bruise/bleed easily.  Psychiatric/Behavioral: Negative for confusion.      No Known Allergies   Past Medical History:  Diagnosis Date  . Seizures ([place])   . Vitamin B12 deficiency 02/03/2019     Past Surgical History:  Procedure Laterality Date  . ESOPHAGOGASTRODUODENOSCOPY (EGD) WITH PROPOFOL N/A  11/07/2018   Procedure: ESOPHAGOGASTRODUODENOSCOPY (EGD) WITH PROPOFOL;  Surgeon: Jonathon Bellows, MD;  Location: [facility] ENDOSCOPY;  Service: Gastroenterology;  Laterality: N/A;  . NO PAST SURGERIES      Social History   Socioeconomic History  . Marital status: Single    Spouse name: Not on file  . Number of children: Not on file  . Years of education: Not on file  . Highest education level: Not on file  Occupational History  . Not on file  Social Needs  . Financial resource strain: Not on file  . Food insecurity    Worry: Not on file    Inability: Not on file  . Transportation needs    Medical: Not on file    Non-medical: Not on file  Tobacco Use  . Smoking status: Current Every Day Smoker    Packs/day: 0.50    Years: 41.00    Pack years: 20.50    Types: Cigarettes  . Smokeless tobacco: Never Used  Substance and Sexual Activity  . Alcohol use: Yes    Alcohol/week: 4.0 standard drinks    Types: 4 Cans of beer per week  . Drug use: Not Currently  . Sexual activity: Not on file  Lifestyle  . Physical activity    Days per week: Not on file    Minutes per session: Not on file  . Stress: Not on file  Relationships  . Social Herbalist on phone: Not on file    Gets together: Not on file    Attends religious service: Not on file    Active member of club or organization: Not on file    Attends meetings of clubs or organizations: Not on file  Relationship status: Not on file  . Intimate partner violence    Fear of current or ex partner: Not on file    Emotionally abused: Not on file    Physically abused: Not on file    Forced sexual activity: Not on file  Other Topics Concern  . Not on file  Social History Narrative  . Not on file    Family History  Problem Relation Age of Onset  . Cancer Neg Hx      Current Outpatient Medications:  .  folic acid (FOLVITE) 132 MCG tablet, Take 1 tablet (400 mcg total) by mouth daily., Disp: 90 tablet, Rfl: 0 .   levETIRAcetam (KEPPRA) 500 MG tablet, Take 500 mg by mouth., Disp: , Rfl:  .  phenytoin (DILANTIN) 200 MG ER capsule, Take 1 capsule (200 mg total) by mouth at bedtime., Disp: 30 capsule, Rfl: 0 .  simvastatin (ZOCOR) 20 MG tablet, , Disp: , Rfl:  .  vitamin B-12 (CYANOCOBALAMIN) 1000 MCG tablet, Take 1 tablet (1,000 mcg total) by mouth daily., Disp: 90 tablet, Rfl: 0 .  albuterol (VENTOLIN HFA) 108 (90 Base) MCG/ACT inhaler, , Disp: , Rfl:   Physical exam: ECOG 1 Vitals:   02/03/19 1036  BP: 124/83  Pulse: 83  Resp: 16  Temp: (!) 96.9 F (36.1 C)  TempSrc: Tympanic  Weight: 124 lb (56.2 kg)   Physical Exam Constitutional:      General: He is not in acute distress. HENT:     Head: Normocephalic and atraumatic.  Eyes:     General: No scleral icterus.    Pupils: Pupils are equal, round, and reactive to light.  Neck:     Musculoskeletal: Normal range of motion and neck supple.  Cardiovascular:     Rate and Rhythm: Normal rate and regular rhythm.     Heart sounds: Normal heart sounds.  Pulmonary:     Effort: Pulmonary effort is normal. No respiratory distress.     Breath sounds: No wheezing.  Abdominal:     General: Bowel sounds are normal. There is no distension.     Palpations: Abdomen is soft. There is no mass.     Tenderness: There is no abdominal tenderness.  Musculoskeletal: Normal range of motion.        General: No deformity.  Skin:    General: Skin is warm and dry.     Findings: No erythema or rash.  Neurological:     Mental Status: He is alert and oriented to person, place, and time.     Cranial Nerves: No cranial nerve deficit.     Coordination: Coordination normal.  Psychiatric:        Behavior: Behavior normal.        Thought Content: Thought content normal.     CMP Latest Ref Rng & Units 10/14/2018  Glucose 70 - 99 mg/dL 92  BUN 6 - 20 mg/dL 6  Creatinine 0.61 - 1.24 mg/dL 0.85  Sodium 135 - 145 mmol/L 136  Potassium 3.5 - 5.1 mmol/L 4.5  Chloride 98  - 111 mmol/L 100  CO2 22 - 32 mmol/L 25  Calcium 8.9 - 10.3 mg/dL 8.7(L)  Total Protein 6.5 - 8.1 g/dL 7.6  Total Bilirubin 0.3 - 1.2 mg/dL 0.3  Alkaline Phos 38 - 126 U/L 177(H)  AST 15 - 41 U/L 34  ALT 0 - 44 U/L 22   CBC Latest Ref Rng & Units 01/27/2019  WBC 4.0 - 10.5 K/uL 6.1  Hemoglobin 13.0 -  17.0 g/dL 10.7(L)  Hematocrit 39.0 - 52.0 % 32.2(L)  Platelets 150 - 400 K/uL 163    No results found.   Assessment and plan   1. Anemia, unspecified type   2. Vitamin B12 deficiency   3. Folate deficiency   4. Alcohol use   5. Bone lesion    #Anemia, labs are reviewed and discussed with patient. Hemoglobin level 10.7, decreased from 35-monthago. #Thrombocytopenia improved to 1 63,000. Cytopenia can be secondary to chronic alcohol use. Multiple myeloma panel was checked previously.  Result was nonremarkable.  #Vitamin B 12 deficiency, vitamin B12 level is persistently low despite taking oral vitamin B12 supplementation. Discussed with patient and will start him on parenteral vitamin B12 treatments.  #Folate deficiency, despite being on oral folic acid. Will start Folvite injections weekly x5.  # Liver cirrhosis. Alcohol cessation discussed with patient.  Right upper quadrant ultrasound was scheduled in November 2020.  For screening of HModena  #Abdominal MRI, repeat thoracic spine with and without contrast .  MRI was scheduled next week  Orders Placed This Encounter  Procedures  . CBC with Differential/Platelet    Standing Status:   Future    Standing Expiration Date:   02/03/2020  . Iron and TIBC    Standing Status:   Future    Standing Expiration Date:   02/03/2020  . Folate    Standing Status:   Future    Standing Expiration Date:   02/03/2020  . Ferritin    Standing Status:   Future    Standing Expiration Date:   02/03/2020  . Vitamin B12    Standing Status:   Future    Standing Expiration Date:   02/03/2020  . CBC with Differential/Platelet    Standing Status:    Future    Standing Expiration Date:   02/03/2020  . Ferritin    Standing Status:   Future    Standing Expiration Date:   02/03/2020  . Folate    Standing Status:   Future    Standing Expiration Date:   02/03/2020  . Iron and TIBC    Standing Status:   Future    Standing Expiration Date:   02/03/2020  . Vitamin B12    Standing Status:   Future    Standing Expiration Date:   02/03/2020      Follow-up in 3 months.   ZEarlie Server MD, PhD Hematology Oncology [facility] Surgery CenterPager- [phone]/18/2020

## 2019-02-05 ENCOUNTER — Ambulatory Visit
Admission: RE | Admit: 2019-02-05 | Discharge: 2019-02-05 | Disposition: A | Discharge status: Home / Self Care | Payer: Medicare Other | Source: Ambulatory Visit | Attending: Oncology | Admitting: Oncology

## 2019-02-05 ENCOUNTER — Other Ambulatory Visit: Payer: Self-pay

## 2019-02-05 DIAGNOSIS — R9389 Abnormal findings on diagnostic imaging of other specified body structures: Secondary | ICD-10-CM | POA: Diagnosis present

## 2019-02-05 IMAGING — MR MR THORACIC SPINE WO/W CM
7 of 11 series · 26 of 48 positions shown · IV contrast (gadavist)
Comparison: MR abdomen [DATE]

CLINICAL DATA: Indeterminate bone lesion seen on MRI of the abdomen
dated [DATE]. Patient presents for characterization.

EXAM:
MRI THORACIC WITHOUT AND WITH CONTRAST
TECHNIQUE: Multiplanar and multiecho pulse sequences of the thoracic spine were
obtained without and with intravenous contrast.
CONTRAST:  5mL GADAVIST GADOBUTROL 1 MMOL/ML IV SOLN

[Series 18: T1 · sagittal · 6.0mm · 1.98mm/px · 3 of 11 slices shown (1 of 3)]
[im 1/11]
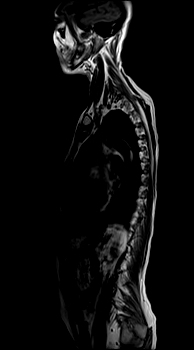
[im 6/11]
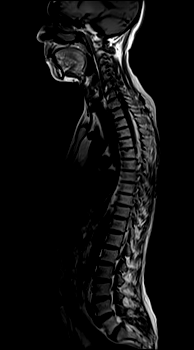
[im 11/11]
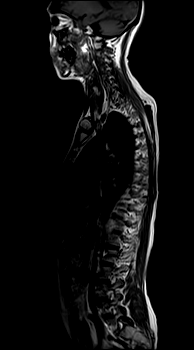

[Series 19: T2 · sagittal · 3.0mm · 1.06mm/px · 3 of 17 slices shown (1 of 2)]
[im 1/17]
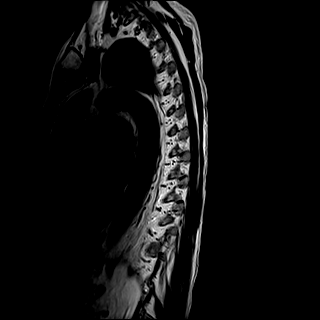
[im 9/17]
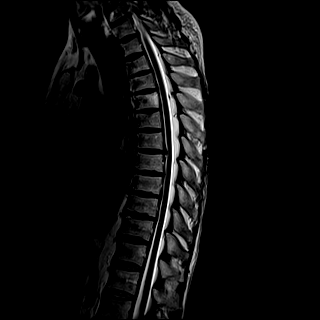
[im 17/17]
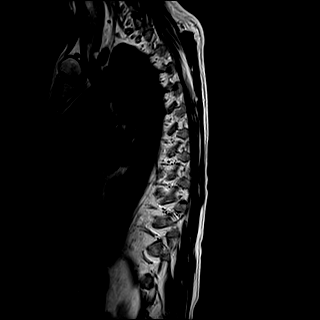

[Series 20: T1 · sagittal · 3.0mm · 1.06mm/px · 3 of 17 slices shown (2 of 3)]
[im 1/17]
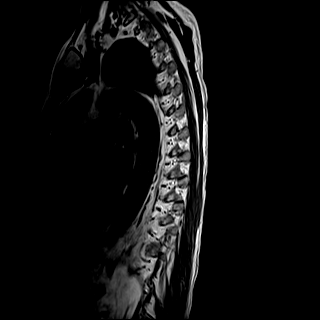
[im 9/17]
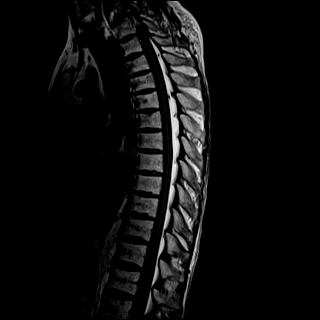
[im 17/17]
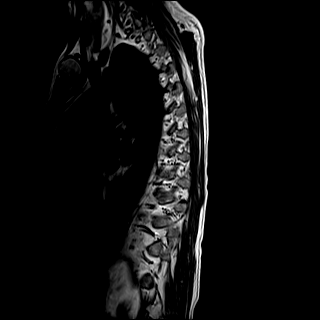

[Series 21: STIR · sagittal · 3.0mm · 0.53mm/px · 2 of 17 slices shown]
[im 1/17]
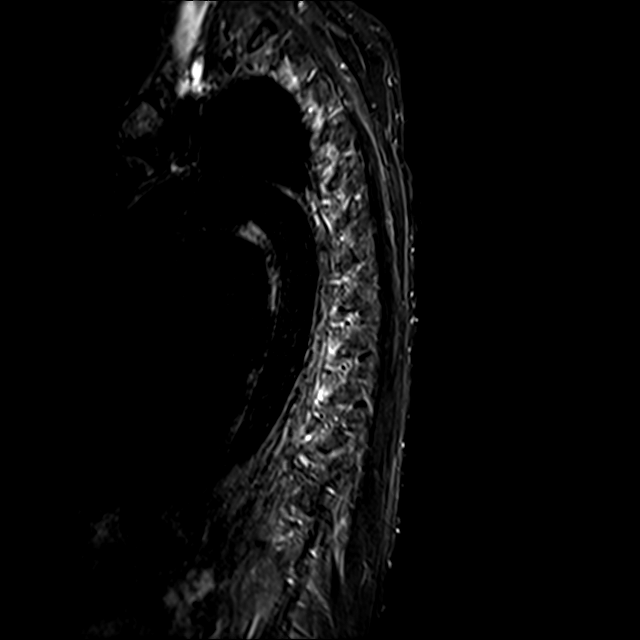
[im 9/17]
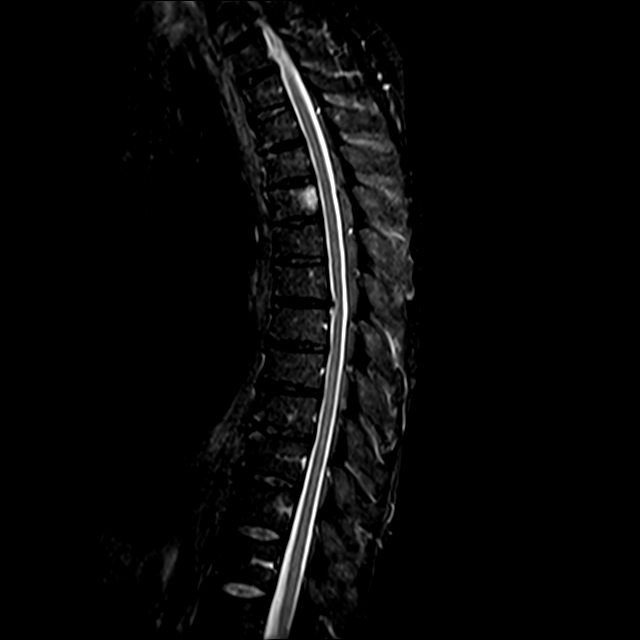

[Series 22: T2 · axial · 4.0mm · 0.59mm/px · z∈[-219,-10]mm · 6 of 39 slices shown (2 of 2)]
[im 1/39]
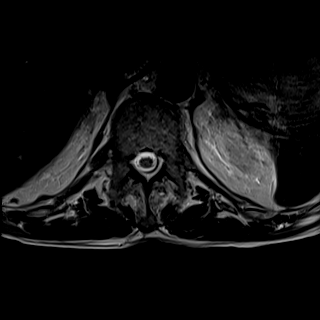
[im 8/39]
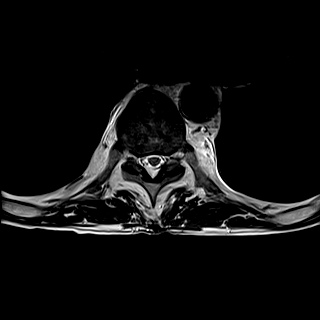
[im 16/39]
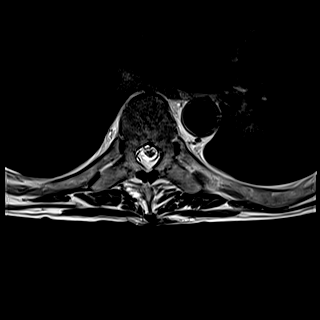
[im 23/39]
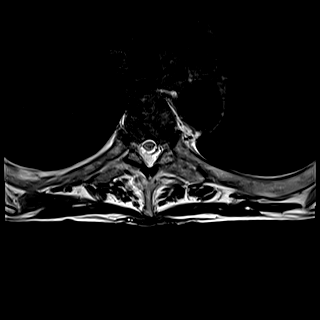
[im 31/39]
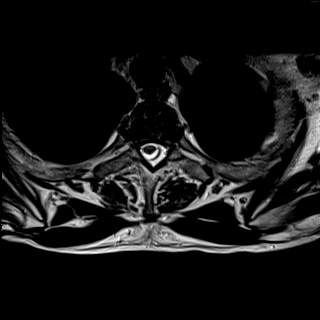
[im 39/39]
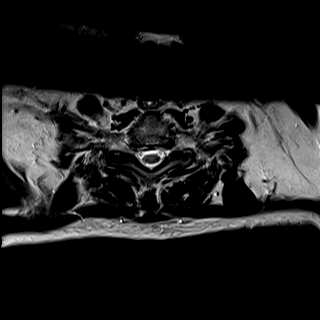

[Series 24: T1 · axial · non-contrast · 4.0mm · 0.33mm/px · z∈[-219,-10]mm · 6 of 39 slices shown (3 of 3)]
[im 1/39]
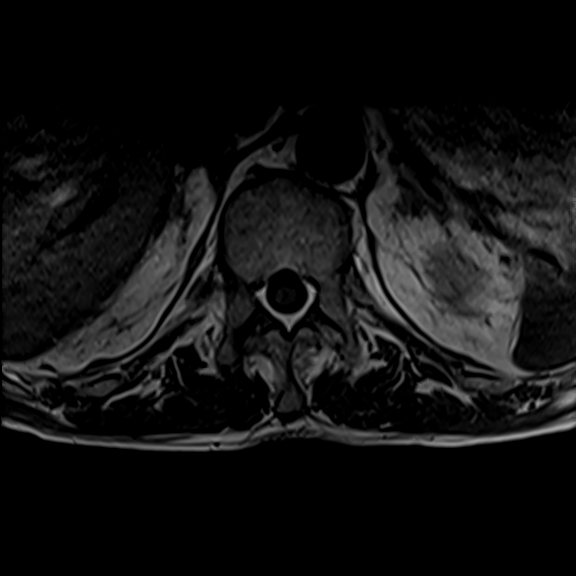
[im 8/39]
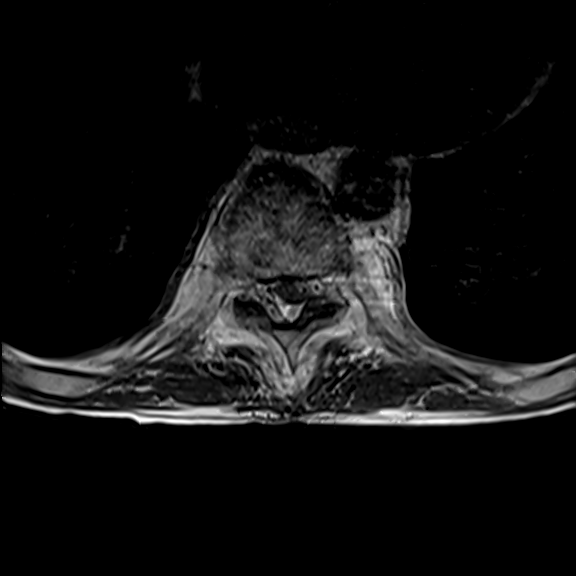
[im 16/39]
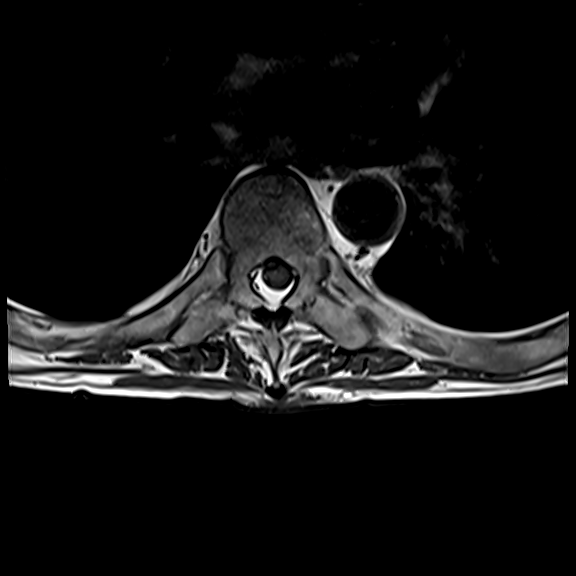
[im 23/39]
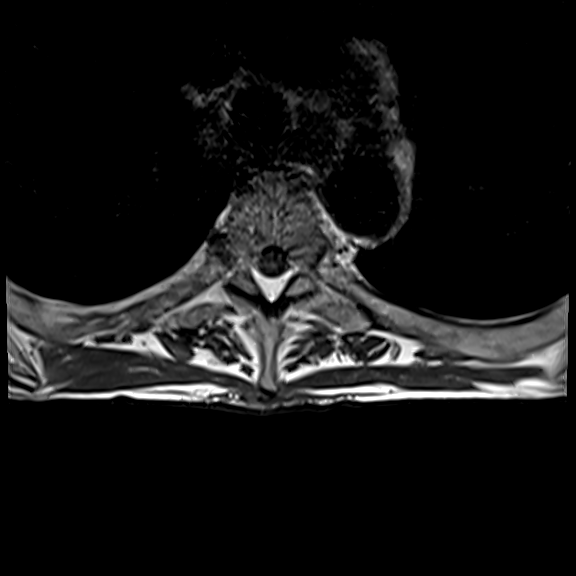
[im 31/39]
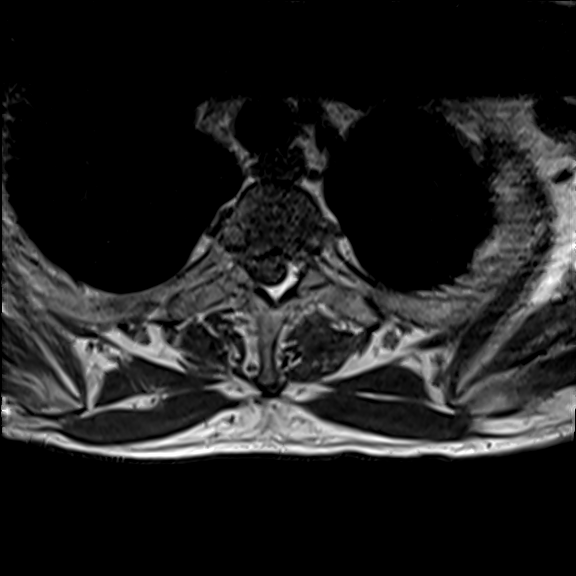
[im 39/39]
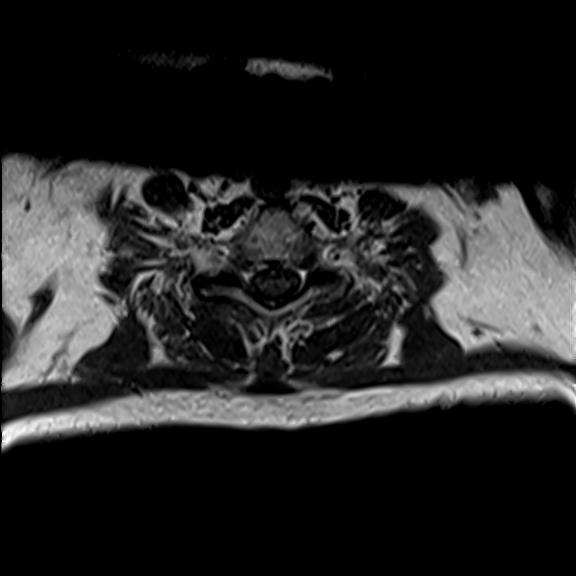

[Series 27: T1 fat-sat post-contrast · sagittal · 3.0mm · 1.06mm/px · 3 of 17 slices shown]
[im 1/17]
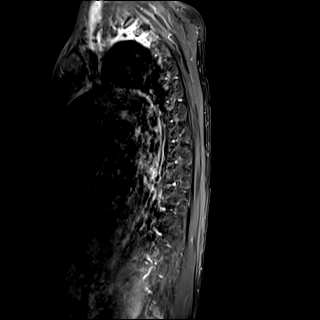
[im 9/17]
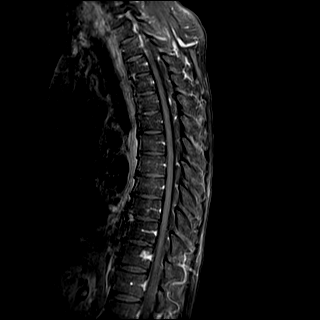
[im 17/17]
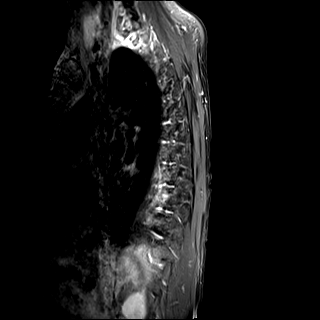

[26 of 48 positions shown; findings below may reference images not displayed]

FINDINGS: MRI THORACIC SPINE FINDINGS

Alignment:  Physiologic.

Segmentation: When counting from above there are 6 lumbar type
vertebral bodies. For the purposes of this examination, the
vertebral bodies are numbered starting at C2.

Vertebrae: No acute fracture. No discitis or osteomyelitis. No bone
destruction. 15 mm T2 hyperintense, T1 intermediate signal enhancing
bone lesion in the T5 vertebral body without surrounding marrow
edema or bone destruction with a internal stippled appearance likely
reflecting a lipid poor atypical hemangioma. Similar 10 mm T12
vertebral body bone lesion with T2 hyperintensity, T1 intermediate
signal and enhancement on postcontrast imaging likely reflecting a
lipid poor atypical hemangioma. 7 mm T1 and T2 hyperintense
enhancing T7 vertebral body bone lesion most consistent with a small
hemangioma.

No aggressive osseous lesion.

Cord:  Normal signal and morphology.

Paraspinal and other soft tissues: No acute paraspinal abnormality.
Thickening of the esophagus as can be seen with mild esophagitis.

Disc levels:

Disc spaces: Degenerative disease mild disc height loss at T7-8,
T8-9, T9-10.

T1-T2: Mild broad-based disc bulge. No foraminal or central canal
stenosis.

T2-T3: No disc protrusion, foraminal stenosis or central canal
stenosis.

T3-T4: No disc protrusion, foraminal stenosis or central canal
stenosis.

T4-T5: No disc protrusion, foraminal stenosis or central canal
stenosis.

T5-T6: No disc protrusion, foraminal stenosis or central canal
stenosis.

T6-T7: No disc protrusion, foraminal stenosis or central canal
stenosis.

T7-T8: Small central disc protrusion. No foraminal or central canal
stenosis.

T8-T9: No disc protrusion, foraminal stenosis or central canal
stenosis.

T9-T10: Small right paracentral disc protrusion. No foraminal or
central canal stenosis.

T10-T11: No disc protrusion, foraminal stenosis or central canal
stenosis.

T11-T12: No disc protrusion, foraminal stenosis or central canal
stenosis.
IMPRESSION: 1.  No acute osseous injury of the thoracic spine.
2. T5 and T12 vertebral body bone lesions with imaging appearance
most suggestive of atypical lipid poor hemangiomas. Given the
atypical appearance, follow-up evaluation in 6 months is recommended
to evaluate for continued stability.

## 2019-02-05 MED ORDER — GADOBUTROL 1 MMOL/ML IV SOLN
5.0000 mL | Freq: Once | INTRAVENOUS | Status: AC | PRN
  Administered 2019-02-05: 10:00:00 5 mL via INTRAVENOUS

## 2019-02-06 ENCOUNTER — Inpatient Hospital Stay: Payer: Medicare Other

## 2019-02-06 ENCOUNTER — Other Ambulatory Visit: Payer: Self-pay

## 2019-02-06 DIAGNOSIS — E538 Deficiency of other specified B group vitamins: Secondary | ICD-10-CM

## 2019-02-06 MED ORDER — FOLIC ACID 5 MG/ML IJ SOLN
1.0000 mg | Freq: Once | INTRAMUSCULAR | Status: AC
  Administered 2019-02-06: 15:00:00 1 mg via INTRAMUSCULAR
  Filled 2019-02-06: qty 0.2

## 2019-02-06 MED ORDER — FOLIC ACID 5 MG/ML IJ SOLN
1.0000 mg | Freq: Once | INTRAMUSCULAR | Status: DC
  Filled 2019-02-06 (×2): qty 0.2

## 2019-02-06 MED ORDER — CYANOCOBALAMIN 1000 MCG/ML IJ SOLN
1000.0000 ug | Freq: Once | INTRAMUSCULAR | Status: AC
  Administered 2019-02-06: 15:00:00 1000 ug via INTRAMUSCULAR

## 2019-02-07 ENCOUNTER — Other Ambulatory Visit: Payer: Self-pay

## 2019-02-07 ENCOUNTER — Inpatient Hospital Stay: Payer: Medicare Other

## 2019-02-07 DIAGNOSIS — E538 Deficiency of other specified B group vitamins: Secondary | ICD-10-CM | POA: Diagnosis not present

## 2019-02-07 MED ORDER — CYANOCOBALAMIN 1000 MCG/ML IJ SOLN
1000.0000 ug | Freq: Once | INTRAMUSCULAR | Status: AC
  Administered 2019-02-07: 11:00:00 1000 ug via INTRAMUSCULAR
  Filled 2019-02-07: qty 1

## 2019-02-08 ENCOUNTER — Inpatient Hospital Stay: Payer: Medicare Other

## 2019-02-08 ENCOUNTER — Other Ambulatory Visit: Payer: Self-pay

## 2019-02-08 DIAGNOSIS — E538 Deficiency of other specified B group vitamins: Secondary | ICD-10-CM

## 2019-02-08 MED ORDER — CYANOCOBALAMIN 1000 MCG/ML IJ SOLN
1000.0000 ug | Freq: Once | INTRAMUSCULAR | Status: AC
  Administered 2019-02-08: 1000 ug via INTRAMUSCULAR

## 2019-02-09 ENCOUNTER — Inpatient Hospital Stay: Payer: Medicare Other

## 2019-02-09 ENCOUNTER — Other Ambulatory Visit: Payer: Self-pay

## 2019-02-09 DIAGNOSIS — E538 Deficiency of other specified B group vitamins: Secondary | ICD-10-CM | POA: Diagnosis not present

## 2019-02-09 MED ORDER — CYANOCOBALAMIN 1000 MCG/ML IJ SOLN
1000.0000 ug | Freq: Once | INTRAMUSCULAR | Status: AC
  Administered 2019-02-09: 13:00:00 1000 ug via INTRAMUSCULAR
  Filled 2019-02-09: qty 1

## 2019-02-10 ENCOUNTER — Other Ambulatory Visit: Payer: Self-pay

## 2019-02-10 ENCOUNTER — Inpatient Hospital Stay: Payer: Medicare Other

## 2019-02-10 DIAGNOSIS — E538 Deficiency of other specified B group vitamins: Secondary | ICD-10-CM

## 2019-02-10 MED ORDER — CYANOCOBALAMIN 1000 MCG/ML IJ SOLN
1000.0000 ug | Freq: Once | INTRAMUSCULAR | Status: AC
  Administered 2019-02-10: 1000 ug via INTRAMUSCULAR

## 2019-02-13 ENCOUNTER — Other Ambulatory Visit: Payer: Self-pay

## 2019-02-13 ENCOUNTER — Inpatient Hospital Stay: Payer: Medicare Other

## 2019-02-13 DIAGNOSIS — E538 Deficiency of other specified B group vitamins: Secondary | ICD-10-CM | POA: Diagnosis not present

## 2019-02-13 MED ORDER — CYANOCOBALAMIN 1000 MCG/ML IJ SOLN
1000.0000 ug | Freq: Once | INTRAMUSCULAR | Status: AC
  Administered 2019-02-13: 1000 ug via INTRAMUSCULAR

## 2019-02-20 ENCOUNTER — Inpatient Hospital Stay: Payer: Medicare Other | Attending: Oncology

## 2019-02-20 ENCOUNTER — Other Ambulatory Visit: Payer: Self-pay

## 2019-02-20 DIAGNOSIS — E538 Deficiency of other specified B group vitamins: Secondary | ICD-10-CM | POA: Insufficient documentation

## 2019-02-20 DIAGNOSIS — D509 Iron deficiency anemia, unspecified: Secondary | ICD-10-CM | POA: Diagnosis not present

## 2019-02-20 MED ORDER — CYANOCOBALAMIN 1000 MCG/ML IJ SOLN
1000.0000 ug | Freq: Once | INTRAMUSCULAR | Status: AC
  Administered 2019-02-20: 1000 ug via INTRAMUSCULAR

## 2019-02-27 ENCOUNTER — Inpatient Hospital Stay: Payer: Medicare Other

## 2019-02-27 ENCOUNTER — Other Ambulatory Visit: Payer: Self-pay

## 2019-02-27 DIAGNOSIS — E538 Deficiency of other specified B group vitamins: Secondary | ICD-10-CM

## 2019-02-27 MED ORDER — CYANOCOBALAMIN 1000 MCG/ML IJ SOLN
1000.0000 ug | Freq: Once | INTRAMUSCULAR | Status: AC
  Administered 2019-02-27: 13:00:00 1000 ug via INTRAMUSCULAR

## 2019-03-03 ENCOUNTER — Other Ambulatory Visit: Payer: Self-pay

## 2019-03-06 ENCOUNTER — Inpatient Hospital Stay: Payer: Medicare Other

## 2019-03-06 ENCOUNTER — Other Ambulatory Visit: Payer: Self-pay

## 2019-03-06 DIAGNOSIS — E538 Deficiency of other specified B group vitamins: Secondary | ICD-10-CM

## 2019-03-06 MED ORDER — CYANOCOBALAMIN 1000 MCG/ML IJ SOLN
1000.0000 ug | Freq: Once | INTRAMUSCULAR | Status: AC
  Administered 2019-03-06: 1000 ug via INTRAMUSCULAR

## 2019-03-13 ENCOUNTER — Inpatient Hospital Stay: Payer: Medicare Other

## 2019-03-13 ENCOUNTER — Other Ambulatory Visit: Payer: Self-pay

## 2019-03-13 DIAGNOSIS — E538 Deficiency of other specified B group vitamins: Secondary | ICD-10-CM | POA: Diagnosis not present

## 2019-03-13 DIAGNOSIS — D649 Anemia, unspecified: Secondary | ICD-10-CM

## 2019-03-13 LAB — CBC WITH DIFFERENTIAL/PLATELET
Abs Immature Granulocytes: 0.02 10*3/uL (ref 0.00–0.07)
Basophils Absolute: 0.1 10*3/uL (ref 0.0–0.1)
Basophils Relative: 1 %
Eosinophils Absolute: 0.1 10*3/uL (ref 0.0–0.5)
Eosinophils Relative: 2 %
HCT: 37.3 % — ABNORMAL LOW (ref 39.0–52.0)
Hemoglobin: 11.9 g/dL — ABNORMAL LOW (ref 13.0–17.0)
Immature Granulocytes: 0 %
Lymphocytes Relative: 25 %
Lymphs Abs: 1.3 10*3/uL (ref 0.7–4.0)
MCH: 30 pg (ref 26.0–34.0)
MCHC: 31.9 g/dL (ref 30.0–36.0)
MCV: 94 fL (ref 80.0–100.0)
Monocytes Absolute: 0.7 10*3/uL (ref 0.1–1.0)
Monocytes Relative: 14 %
Neutro Abs: 3 10*3/uL (ref 1.7–7.7)
Neutrophils Relative %: 58 %
Platelets: 169 10*3/uL (ref 150–400)
RBC: 3.97 MIL/uL — ABNORMAL LOW (ref 4.22–5.81)
RDW: 15.4 % (ref 11.5–15.5)
WBC: 5.2 10*3/uL (ref 4.0–10.5)
nRBC: 0 % (ref 0.0–0.2)

## 2019-03-13 LAB — IRON AND TIBC
Iron: 159 ug/dL (ref 45–182)
Saturation Ratios: 60 % — ABNORMAL HIGH (ref 17.9–39.5)
TIBC: 263 ug/dL (ref 250–450)
UIBC: 104 ug/dL

## 2019-03-13 LAB — VITAMIN B12: Vitamin B-12: 540 pg/mL (ref 180–914)

## 2019-03-13 LAB — FERRITIN: Ferritin: 65 ng/mL (ref 24–336)

## 2019-03-13 LAB — FOLATE: Folate: 6 ng/mL (ref >5.9)

## 2019-03-13 MED ORDER — CYANOCOBALAMIN 1000 MCG/ML IJ SOLN
1000.0000 ug | Freq: Once | INTRAMUSCULAR | Status: AC
  Administered 2019-03-13: 11:00:00 1000 ug via INTRAMUSCULAR

## 2019-04-27 ENCOUNTER — Other Ambulatory Visit: Payer: Self-pay

## 2019-04-27 ENCOUNTER — Ambulatory Visit
Admission: RE | Admit: 2019-04-27 | Discharge: 2019-04-27 | Disposition: A | Discharge status: Home / Self Care | Payer: Medicare Other | Source: Ambulatory Visit | Attending: Gastroenterology | Admitting: Gastroenterology

## 2019-04-27 DIAGNOSIS — K7030 Alcoholic cirrhosis of liver without ascites: Secondary | ICD-10-CM | POA: Insufficient documentation

## 2019-04-27 IMAGING — US US ABDOMEN LIMITED
1 series · 14 of 25 positions shown · non-contrast
Comparison: MRI of the abdomen [DATE]. Abdominal ultrasound
[DATE].

CLINICAL DATA: History of alcoholic cirrhosis.

EXAM:
ULTRASOUND ABDOMEN LIMITED RIGHT UPPER QUADRANT

[Series 1: us abdomen limited · 0.15mm/px · 51 acquisitions, 14 frames shown]
[im 1/51]
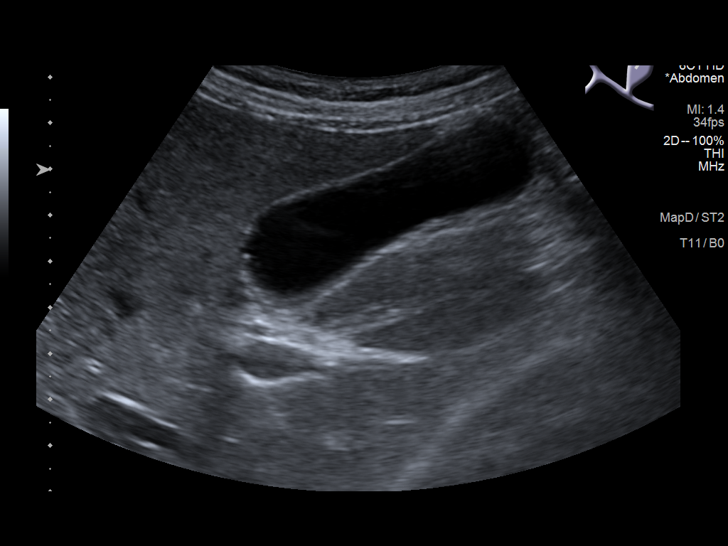
[im 5/51]
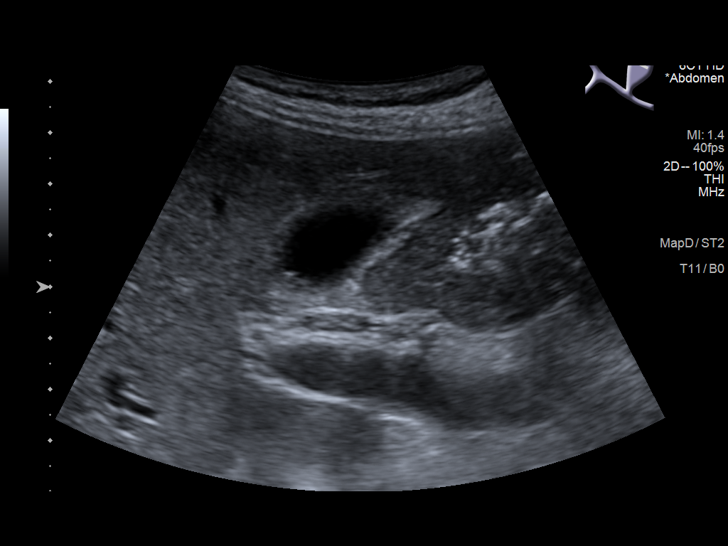
[im 9/51]
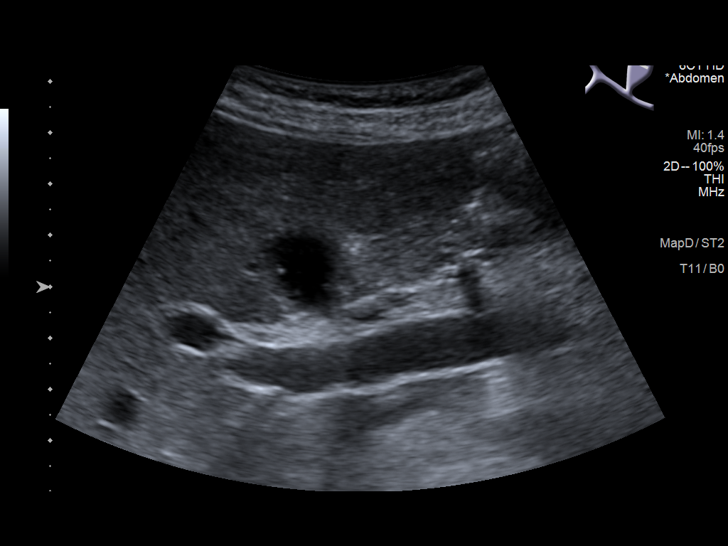
[im 13/51]
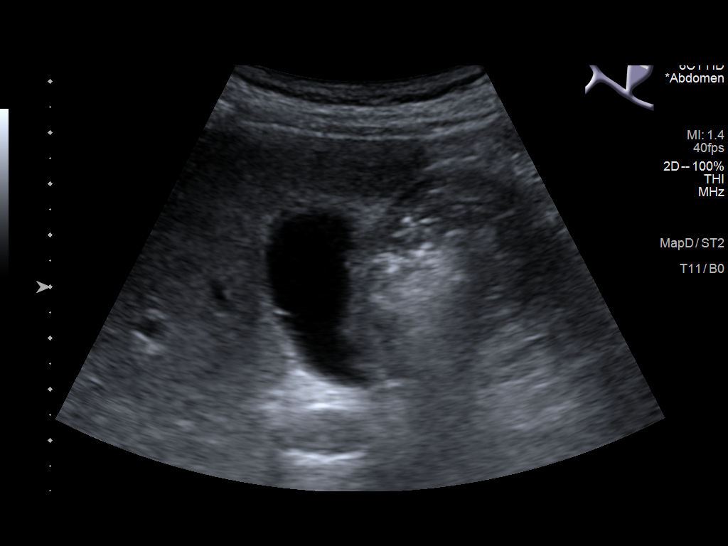
[im 17/51]
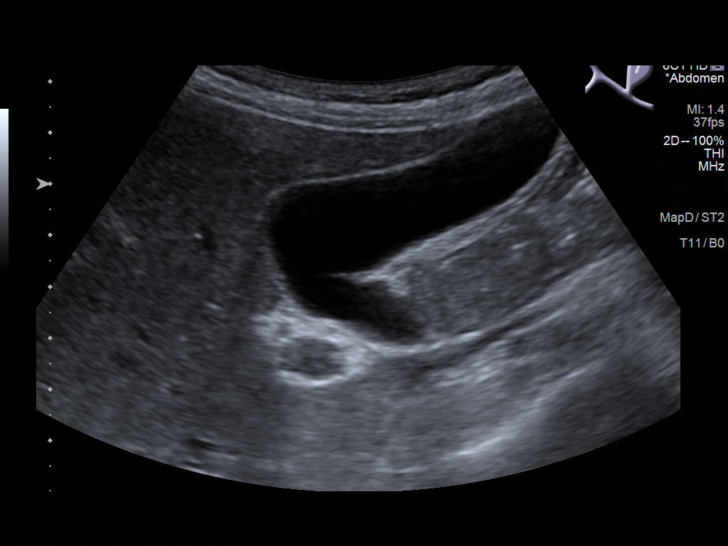
[im 19/51]
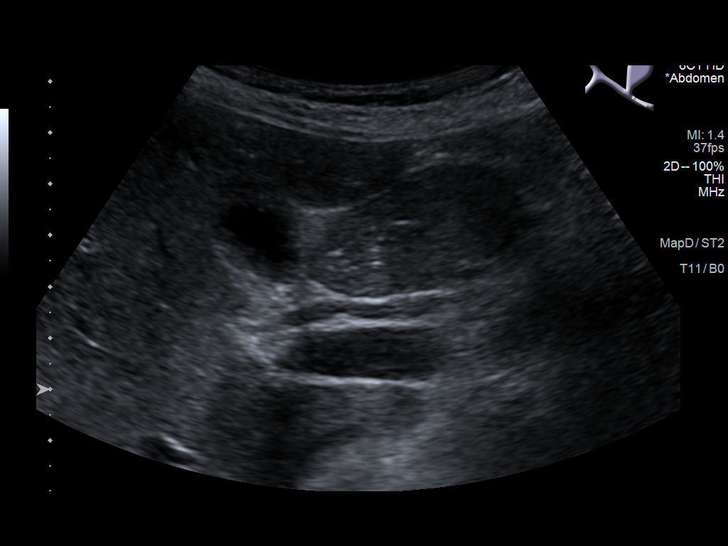
[im 23/51]
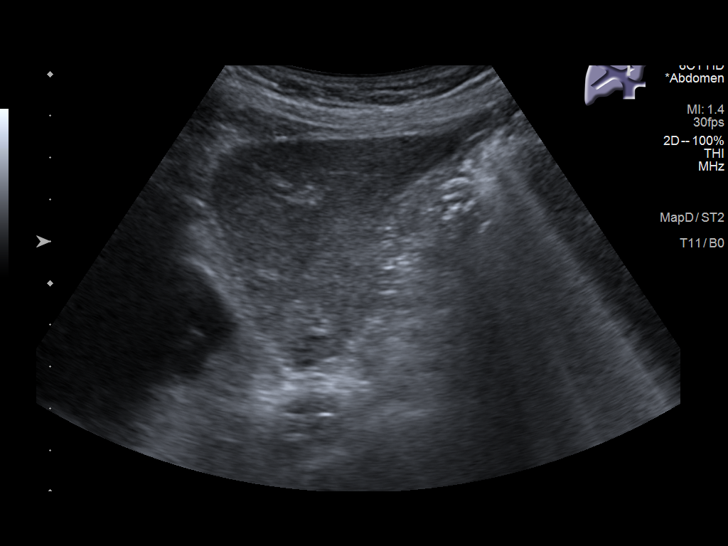
[im 28/51]
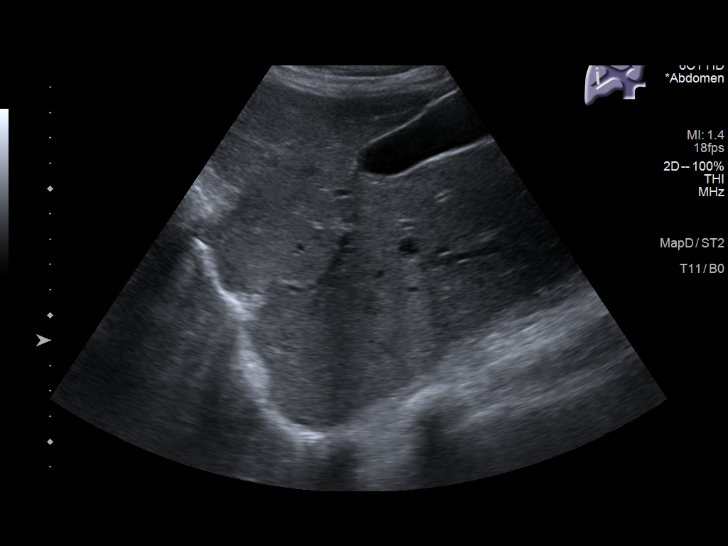
[im 32/51]
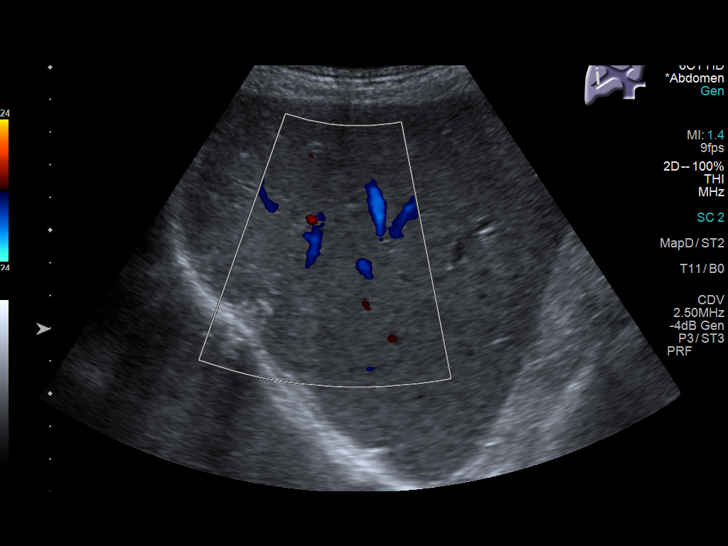
[im 34/51]
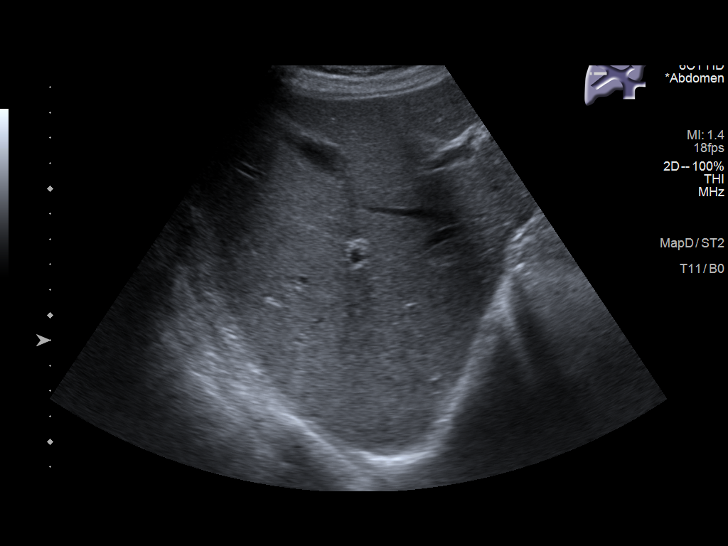
[im 38/51]
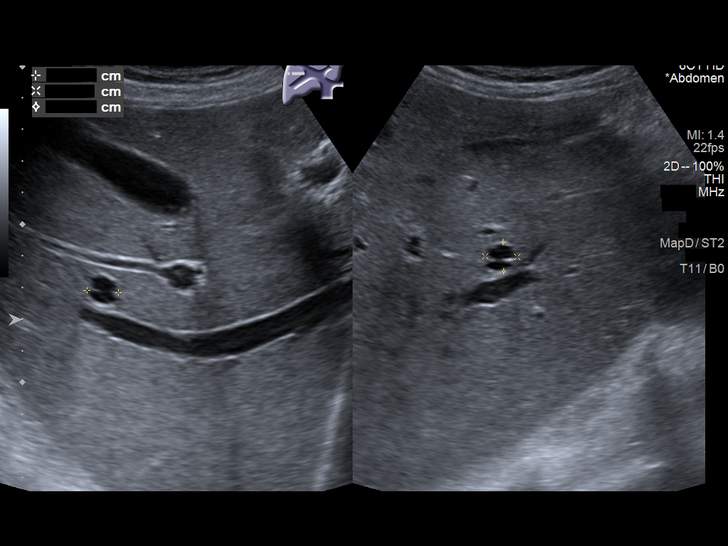
[im 42/51]
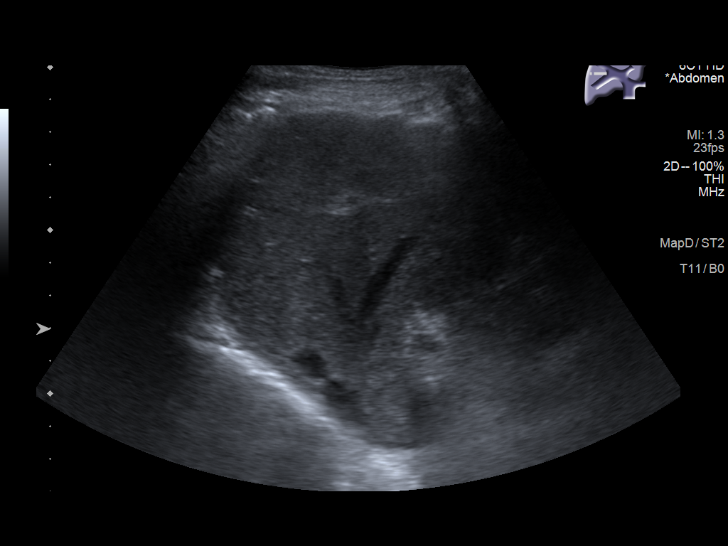
[im 46/51]
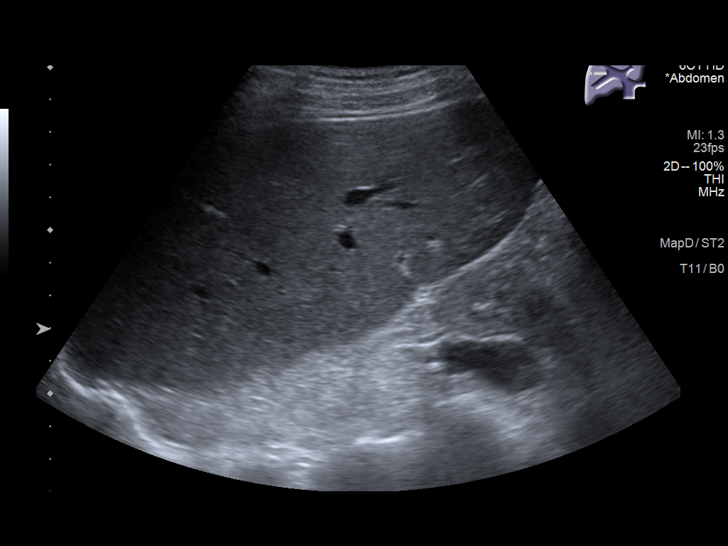
[im 51/51]
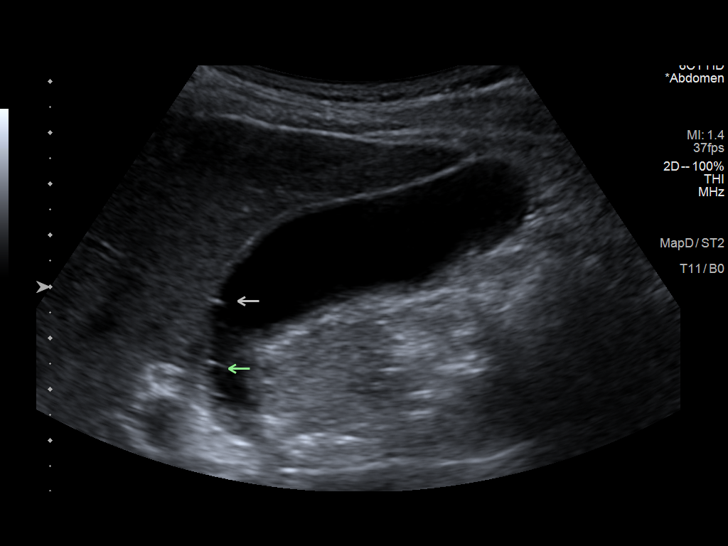

[14 of 25 positions shown; findings below may reference images not displayed]

FINDINGS: Gallbladder:

No gallstones or wall thickening visualized. No sonographic Murphy
sign noted by sonographer. Polyps seen on prior ultrasound are not
visible today.

Common bile duct:

Diameter: 0.3 cm

Liver:

Hyperechoic lesion measuring 1.8 cm consistent with a hemangioma is
seen. Two small cysts measuring 1 cm and 0.7 cm in diameter are
identified. No worrisome liver lesion. Echogenicity is normal.
Portal vein is patent on color Doppler imaging with normal direction
of blood flow towards the liver.

Other: None.
IMPRESSION: No acute abnormality. Negative for worrisome liver lesion. Small
hemangioma is identified as seen on prior studies.

Small gallbladder polyps seen on prior ultrasound are not visible
today. No evidence of cholecystitis. Negative for gallstones.

## 2019-05-01 ENCOUNTER — Ambulatory Visit (INDEPENDENT_AMBULATORY_CARE_PROVIDER_SITE_OTHER): Payer: Medicare Other | Admitting: Gastroenterology

## 2019-05-01 ENCOUNTER — Encounter: Payer: Self-pay | Admitting: Gastroenterology

## 2019-05-01 ENCOUNTER — Other Ambulatory Visit: Payer: Self-pay

## 2019-05-01 VITALS — BP 125/79 | HR 78 | Temp 97.5°F | Ht 67.0 in | Wt 125.6 lb

## 2019-05-01 DIAGNOSIS — K7030 Alcoholic cirrhosis of liver without ascites: Secondary | ICD-10-CM | POA: Diagnosis not present

## 2019-05-01 DIAGNOSIS — R7989 Other specified abnormal findings of blood chemistry: Secondary | ICD-10-CM

## 2019-05-01 NOTE — Progress Notes (Signed)
Trevor Bellows MD, MRCP(U.K) [address]  [place]  [place], [place] [address]  Main: [phone]  Fax: [phone]   Primary Care Physician: Trevor Gaskins, MD  Primary Gastroenterologist:  Dr. Jonathon Lambert   Chief Complaint  Patient presents with  . Follow-up    Alcoholic cirrhosis of liver    HPI: Trevor Lambert is a 58 y.o. male    Summary of history : He has a history of alcohol abuse. Hospital discharge on 07/11/2018, with urosepsis and possible aspiration pneumonia and into the ICU with hypotension. Incidentally was found to have cirrhosis of the liver and a 1.7 cm hyperechoic lesion in the right hepatic lobe. I was also consulted for dysphagia. He did recall that he had been having difficulty swallowing for a few weeks mostly of solids he mentioned that it did get stuck in his neck denied a similar issue in the past. I obtained a barium swallow which showed a distal esophageal ulcer along the lateral aspect of indeterminant etiology with underlying esophagitis. Moderate gastroesophageal reflux was noted and no strictures seen. I did not wish to pursue with an endoscopy at that point of time as he had been admitted with an aspiration pneumonia.  10/14/2018: No HFE gene mutation noted, TSH elevated at 5.78. CMP shows an albumin of 3.3 total bilirubin of 0.8. Creatinine of 0.85 and sodium of 136. Hemoglobin 14.1 g and platelet count of 141. MCV of 88. Elevated IgG at 1769. Very low B12 at 174. INR 1.0. 08/17/2018: TIBC 65 iron percentage saturation 63%, ferritin 395. Smooth muscle antibody 10 normal. Hep A antibody total negative. HBsAg, HBsAb, hepatitis C antibody, hepatitis B E antibody and antigen, hepatitis B core antibody, HIV, ceruloplasmin, alpha-1 antitrypsin, celiac serology, ANA, were either normal or negative.  09/26/2018: MRI abdomen: Small benign hepatic hemangiomas and cysts noted. 2 indeterminant T11 vertebral body lesions which may  represent atypical lipid poor vertebral hemangiomas.  Seen Dr Tasia Catchings for  elevated IgG and microcytic anemia with normal iron, low b12 ,abnormal lesion seen on the vertebral column.Placed on b12 replacement, myeloma panel negative .  11/07/2018: EGD: chronic inactive gastritis- no esophageal varices.   Interval history9/12/2018-05/01/2019   04/27/2019: Right upper quadrant ultrasound: No abnormal lesions of the liver.  Small hemangioma noted.     Still drinks 1 drink every day.  I strongly urged him to stop.  Denies any confusion.  Has a bowel movement every day.  No abdominal pain.  No melena.  No other complaints.  Weight has been stable      Current Outpatient Medications  Medication Sig Dispense Refill  . albuterol (VENTOLIN HFA) 108 (90 Base) MCG/ACT inhaler     . folic acid (FOLVITE) 130 MCG tablet Take 1 tablet (400 mcg total) by mouth daily. (Patient not taking: Reported on 05/01/2019) 90 tablet 0  . levETIRAcetam (KEPPRA) 500 MG tablet Take 500 mg by mouth.    . phenytoin (DILANTIN) 200 MG ER capsule Take 1 capsule (200 mg total) by mouth at bedtime. 30 capsule 0  . simvastatin (ZOCOR) 20 MG tablet     . vitamin B-12 (CYANOCOBALAMIN) 1000 MCG tablet Take 1 tablet (1,000 mcg total) by mouth daily. 90 tablet 0   No current facility-administered medications for this visit.    Allergies as of 05/01/2019  . (No Known Allergies)    ROS:  General: Negative for anorexia, weight loss, fever, chills, fatigue, weakness. ENT: Negative for hoarseness, difficulty swallowing , nasal congestion.  CV: Negative for chest pain, angina, palpitations, dyspnea on exertion, peripheral edema.  Respiratory: Negative for dyspnea at rest, dyspnea on exertion, cough, sputum, wheezing.  GI: See history of present illness. GU:  Negative for dysuria, hematuria, urinary incontinence, urinary frequency, nocturnal urination.  Endo: Negative for unusual weight change.    Physical Examination:   BP  125/79   Pulse 78   Temp (!) 97.5 F (36.4 C)   Ht 5\' 7"  (1.702 m)   Wt 125 lb 9.6 oz (57 kg)   BMI 19.67 kg/m   General: Well-nourished, well-developed in no acute distress.  Eyes: No icterus. Conjunctivae pink. Mouth: Oropharyngeal mucosa moist and pink , no lesions erythema or exudate. Lungs: Clear to auscultation bilaterally. Non-labored. Heart: Regular rate and rhythm, no murmurs rubs or gallops.  Abdomen: Bowel sounds are normal, nontender, nondistended, no hepatosplenomegaly or masses, no abdominal bruits or hernia , no rebound or guarding.   Extremities: No lower extremity edema. No clubbing or deformities. Neuro: Alert and oriented x 3.  Grossly intact. Skin: Warm and dry, no jaundice.   Psych: Alert and cooperative, normal mood and affect.   Imaging Studies: Abdomen Limited RUQ  Result Date: 04/27/2019 CLINICAL DATA:  History of alcoholic cirrhosis. EXAM: ULTRASOUND ABDOMEN LIMITED RIGHT UPPER QUADRANT COMPARISON:  MRI of the abdomen 09/26/2018. Abdominal ultrasound 07/09/2018. FINDINGS: Gallbladder: No gallstones or wall thickening visualized. No sonographic Murphy sign noted by sonographer. Polyps seen on prior ultrasound are not visible today. Common bile duct: Diameter: 0.3 cm Liver: Hyperechoic lesion measuring 1.8 cm consistent with a hemangioma is seen. Two small cysts measuring 1 cm and 0.7 cm in diameter are identified. No worrisome liver lesion. Echogenicity is normal. Portal vein is patent on color Doppler imaging with normal direction of blood flow towards the liver. Other: None. IMPRESSION: No acute abnormality. Negative for worrisome liver lesion. Small hemangioma is identified as seen on prior studies. Small gallbladder polyps seen on prior ultrasound are not visible today. No evidence of cholecystitis. Negative for gallstones. Electronically Signed   By: 07/11/2018 M.D.   On: 04/27/2019 09:46    Assessment and Plan:   Trevor Lambert is a 58 y.o. y/o  male here today to follow-up for liver cirrhosislikely secondary to alcohol.Meld score of 6 in May 2020. Child Pugh A6. Found to have low B12 and folate and on supplementation.  Plan :  1.Complete hepatitis a and B vaccination 2. Stop all alcohol consumption.  Stressed on importance 3. Continue on high-dose PPI. Twice daily 4. EGD to screen for varices in 10/2021 5. Right upper quadrant ultrasound in June 2021 to screen for [facility]. 6.  Check meld score with labs, repeat TSH   Dr [facility]  MD,MRCP [facility]) Follow up in 5 months

## 2019-05-02 LAB — COMPREHENSIVE METABOLIC PANEL
ALT: 22 IU/L (ref 0–44)
AST: 40 IU/L (ref 0–40)
Albumin/Globulin Ratio: 1.4 (ref 1.2–2.2)
Albumin: 4.2 g/dL (ref 3.8–4.9)
Alkaline Phosphatase: 150 IU/L — ABNORMAL HIGH (ref 39–117)
BUN/Creatinine Ratio: 7 — ABNORMAL LOW (ref 9–20)
BUN: 5 mg/dL — ABNORMAL LOW (ref 6–24)
Bilirubin Total: 0.2 mg/dL (ref 0.0–1.2)
CO2: 24 mmol/L (ref 20–29)
Calcium: 9 mg/dL (ref 8.7–10.2)
Chloride: 102 mmol/L (ref 96–106)
Creatinine, Ser: 0.76 mg/dL (ref 0.76–1.27)
GFR calc Af Amer: 118 mL/min/{1.73_m2} (ref >59)
GFR calc non Af Amer: 102 mL/min/{1.73_m2} (ref >59)
Globulin, Total: 3.1 g/dL (ref 1.5–4.5)
Glucose: 87 mg/dL (ref 65–99)
Potassium: 5 mmol/L (ref 3.5–5.2)
Sodium: 141 mmol/L (ref 134–144)
Total Protein: 7.3 g/dL (ref 6.0–8.5)

## 2019-05-02 LAB — CBC WITH DIFFERENTIAL/PLATELET
Basophils Absolute: 0.1 10*3/uL (ref 0.0–0.2)
Basos: 1 %
EOS (ABSOLUTE): 0.1 10*3/uL (ref 0.0–0.4)
Eos: 2 %
Hematocrit: 41.2 % (ref 37.5–51.0)
Hemoglobin: 13.6 g/dL (ref 13.0–17.7)
Immature Grans (Abs): 0 10*3/uL (ref 0.0–0.1)
Immature Granulocytes: 0 %
Lymphocytes Absolute: 1.1 10*3/uL (ref 0.7–3.1)
Lymphs: 26 %
MCH: 28.5 pg (ref 26.6–33.0)
MCHC: 33 g/dL (ref 31.5–35.7)
MCV: 86 fL (ref 79–97)
Monocytes Absolute: 0.6 10*3/uL (ref 0.1–0.9)
Monocytes: 13 %
Neutrophils Absolute: 2.5 10*3/uL (ref 1.4–7.0)
Neutrophils: 58 %
Platelets: 169 10*3/uL (ref 150–450)
RBC: 4.78 x10E6/uL (ref 4.14–5.80)
RDW: 12.9 % (ref 11.6–15.4)
WBC: 4.3 10*3/uL (ref 3.4–10.8)

## 2019-05-02 LAB — PROTIME-INR
INR: 1 (ref 0.9–1.2)
Prothrombin Time: 10.5 s (ref 9.1–12.0)

## 2019-05-02 LAB — TSH: TSH: 5.2 u[IU]/mL — ABNORMAL HIGH (ref 0.450–4.500)

## 2019-05-03 ENCOUNTER — Inpatient Hospital Stay: Payer: Medicare Other

## 2019-05-03 ENCOUNTER — Inpatient Hospital Stay (HOSPITAL_BASED_OUTPATIENT_CLINIC_OR_DEPARTMENT_OTHER): Payer: Medicare Other | Admitting: Oncology

## 2019-05-03 ENCOUNTER — Inpatient Hospital Stay: Payer: Medicare Other | Attending: Oncology

## 2019-05-03 ENCOUNTER — Encounter: Payer: Self-pay | Admitting: Oncology

## 2019-05-03 ENCOUNTER — Other Ambulatory Visit: Payer: Self-pay

## 2019-05-03 VITALS — BP 132/85 | HR 90 | Temp 96.5°F | Resp 16 | Wt 125.3 lb

## 2019-05-03 DIAGNOSIS — K7030 Alcoholic cirrhosis of liver without ascites: Secondary | ICD-10-CM | POA: Insufficient documentation

## 2019-05-03 DIAGNOSIS — D696 Thrombocytopenia, unspecified: Secondary | ICD-10-CM

## 2019-05-03 DIAGNOSIS — R9389 Abnormal findings on diagnostic imaging of other specified body structures: Secondary | ICD-10-CM

## 2019-05-03 DIAGNOSIS — E538 Deficiency of other specified B group vitamins: Secondary | ICD-10-CM | POA: Diagnosis not present

## 2019-05-03 DIAGNOSIS — F10988 Alcohol use, unspecified with other alcohol-induced disorder: Secondary | ICD-10-CM | POA: Diagnosis not present

## 2019-05-03 DIAGNOSIS — Z7289 Other problems related to lifestyle: Secondary | ICD-10-CM | POA: Diagnosis not present

## 2019-05-03 DIAGNOSIS — Z789 Other specified health status: Secondary | ICD-10-CM

## 2019-05-03 DIAGNOSIS — D509 Iron deficiency anemia, unspecified: Secondary | ICD-10-CM | POA: Insufficient documentation

## 2019-05-03 DIAGNOSIS — D649 Anemia, unspecified: Secondary | ICD-10-CM | POA: Diagnosis not present

## 2019-05-03 LAB — IRON AND TIBC
Iron: 196 ug/dL — ABNORMAL HIGH (ref 45–182)
Saturation Ratios: 71 % — ABNORMAL HIGH (ref 17.9–39.5)
TIBC: 277 ug/dL (ref 250–450)
UIBC: 81 ug/dL

## 2019-05-03 LAB — CBC WITH DIFFERENTIAL/PLATELET
Abs Immature Granulocytes: 0.01 10*3/uL (ref 0.00–0.07)
Basophils Absolute: 0.1 10*3/uL (ref 0.0–0.1)
Basophils Relative: 1 %
Eosinophils Absolute: 0.1 10*3/uL (ref 0.0–0.5)
Eosinophils Relative: 2 %
HCT: 41.9 % (ref 39.0–52.0)
Hemoglobin: 13.5 g/dL (ref 13.0–17.0)
Immature Granulocytes: 0 %
Lymphocytes Relative: 22 %
Lymphs Abs: 1.1 10*3/uL (ref 0.7–4.0)
MCH: 29.3 pg (ref 26.0–34.0)
MCHC: 32.2 g/dL (ref 30.0–36.0)
MCV: 91.1 fL (ref 80.0–100.0)
Monocytes Absolute: 0.6 10*3/uL (ref 0.1–1.0)
Monocytes Relative: 13 %
Neutro Abs: 3 10*3/uL (ref 1.7–7.7)
Neutrophils Relative %: 62 %
Platelets: 140 10*3/uL — ABNORMAL LOW (ref 150–400)
RBC: 4.6 MIL/uL (ref 4.22–5.81)
RDW: 13.4 % (ref 11.5–15.5)
WBC: 4.8 10*3/uL (ref 4.0–10.5)
nRBC: 0 % (ref 0.0–0.2)

## 2019-05-03 LAB — FERRITIN: Ferritin: 59 ng/mL (ref 24–336)

## 2019-05-03 LAB — VITAMIN B12: Vitamin B-12: 288 pg/mL (ref 180–914)

## 2019-05-03 LAB — FOLATE: Folate: 7.4 ng/mL (ref >5.9)

## 2019-05-03 MED ORDER — VITAMIN B-12 1000 MCG PO TABS: 1000.0000 ug | ORAL_TABLET | Freq: Every day | ORAL | 0 refills | Status: DC

## 2019-05-03 MED ORDER — CYANOCOBALAMIN 1000 MCG/ML IJ SOLN
1000.0000 ug | Freq: Once | INTRAMUSCULAR | Status: AC
  Administered 2019-05-03: 1000 ug via INTRAMUSCULAR
  Filled 2019-05-03: qty 1

## 2019-05-03 MED ORDER — FOLIC ACID 400 MCG PO TABS: 400.0000 ug | ORAL_TABLET | Freq: Every day | ORAL | 2 refills | Status: DC

## 2019-05-03 NOTE — Progress Notes (Signed)
Patient does not offer any problems today.  

## 2019-05-03 NOTE — Progress Notes (Signed)
Hematology/Oncology Follow Up Note [facility]  [phone] [phone]  Patient Care Team: Oval Linsey, MD as PCP - General (Internal Medicine)   Name of the patient: Trevor Lambert  [id number]  06/14/1960   REASON FOR VISIT  follow-up for anemia and thrombocytopenia, vitamin B12 and folic acid deficiency.  INTERVAL HISTORY Trevor Lambert is a 58 y.o.[place] who has above oncology history reviewed by me today presented for follow up visit for management of microcytic anemia, bone lesion, alcohol use.  Patient reports feeling well today. He still drinks alcohol daily, 1 drink per day. Reports being compliant with taking her medication. He takes oral vitamin B12 and folate tablets. Denies any fatigue, chills, fever, pain, nausea or vomiting. His appetite is good.  Weight has been stable.  Review of Systems  Constitutional: Negative for appetite change, chills, fatigue, fever and unexpected weight change.  HENT:   Negative for hearing loss and voice change.   Eyes: Negative for eye problems and icterus.  Respiratory: Negative for chest tightness, cough and shortness of breath.   Cardiovascular: Negative for chest pain and leg swelling.  Gastrointestinal: Negative for abdominal distention and abdominal pain.  Endocrine: Negative for hot flashes.  Genitourinary: Negative for difficulty urinating, dysuria and frequency.   Musculoskeletal: Negative for arthralgias.  Skin: Negative for itching and rash.  Neurological: Negative for light-headedness and numbness.  Hematological: Negative for adenopathy. Does not bruise/bleed easily.  Psychiatric/Behavioral: Negative for confusion.      No Known Allergies   Past Medical History:  Diagnosis Date  . Seizures (HCC)   . Vitamin B12 deficiency 02/03/2019     Past Surgical History:  Procedure Laterality Date  . ESOPHAGOGASTRODUODENOSCOPY (EGD) WITH PROPOFOL N/A 11/07/2018   Procedure:  ESOPHAGOGASTRODUODENOSCOPY (EGD) WITH PROPOFOL;  Surgeon: Wyline Mood, MD;  Location: [facility] ENDOSCOPY;  Service: Gastroenterology;  Laterality: N/A;  . NO PAST SURGERIES      Social History   Socioeconomic History  . Marital status: Single    Spouse name: Not on file  . Number of children: Not on file  . Years of education: Not on file  . Highest education level: Not on file  Occupational History  . Not on file  Tobacco Use  . Smoking status: Current Every Day Smoker    Packs/day: 0.50    Years: 41.00    Pack years: 20.50    Types: Cigarettes  . Smokeless tobacco: Never Used  Substance and Sexual Activity  . Alcohol use: Yes    Alcohol/week: 4.0 standard drinks    Types: 4 Cans of beer per week  . Drug use: Not Currently  . Sexual activity: Not on file  Other Topics Concern  . Not on file  Social History Narrative  . Not on file   Social Determinants of Health   Financial Resource Strain:   . Difficulty of Paying Living Expenses: Not on file  Food Insecurity:   . Worried About Programme researcher, broadcasting/film/video in the Last Year: Not on file  . Ran Out of Food in the Last Year: Not on file  Transportation Needs:   . Lack of Transportation (Medical): Not on file  . Lack of Transportation (Non-Medical): Not on file  Physical Activity:   . Days of Exercise per Week: Not on file  . Minutes of Exercise per Session: Not on file  Stress:   . Feeling of Stress : Not on file  Social Connections:   . Frequency of Communication  with Friends and Family: Not on file  . Frequency of Social Gatherings with Friends and Family: Not on file  . Attends Religious Services: Not on file  . Active Member of Clubs or Organizations: Not on file  . Attends Archivist Meetings: Not on file  . Marital Status: Not on file  Intimate Partner Violence:   . Fear of Current or Ex-Partner: Not on file  . Emotionally Abused: Not on file  . Physically Abused: Not on file  . Sexually Abused: Not on file     Family History  Problem Relation Age of Onset  . Cancer Neg Hx      Current Outpatient Medications:  .  albuterol (VENTOLIN HFA) 108 (90 Base) MCG/ACT inhaler, , Disp: , Rfl:  .  amoxicillin (AMOXIL) 500 MG capsule, Take 500 mg by mouth 3 (three) times daily., Disp: , Rfl:  .  levETIRAcetam (KEPPRA) 500 MG tablet, Take 500 mg by mouth., Disp: , Rfl:  .  phenytoin (DILANTIN) 200 MG ER capsule, Take 1 capsule (200 mg total) by mouth at bedtime., Disp: 30 capsule, Rfl: 0 .  simvastatin (ZOCOR) 20 MG tablet, , Disp: , Rfl:  .  vitamin B-12 (CYANOCOBALAMIN) 1000 MCG tablet, Take 1 tablet (1,000 mcg total) by mouth daily., Disp: 90 tablet, Rfl: 0 .  folic acid (FOLVITE) 932 MCG tablet, Take 1 tablet (400 mcg total) by mouth daily. (Patient not taking: Reported on 05/01/2019), Disp: 90 tablet, Rfl: 0  Physical exam: ECOG 1 Vitals:   05/03/19 1029  BP: 132/85  Pulse: 90  Resp: 16  Temp: (!) 96.5 F (35.8 C)  Weight: 125 lb 4.8 oz (56.8 kg)   Physical Exam Constitutional:      General: He is not in acute distress. HENT:     Head: Normocephalic and atraumatic.  Eyes:     General: No scleral icterus.    Pupils: Pupils are equal, round, and reactive to light.  Cardiovascular:     Rate and Rhythm: Normal rate and regular rhythm.     Heart sounds: Normal heart sounds.  Pulmonary:     Effort: Pulmonary effort is normal. No respiratory distress.     Breath sounds: No wheezing.  Abdominal:     General: Bowel sounds are normal. There is no distension.     Palpations: Abdomen is soft. There is no mass.     Tenderness: There is no abdominal tenderness.  Musculoskeletal:        General: No deformity. Normal range of motion.     Cervical back: Normal range of motion and neck supple.  Skin:    General: Skin is warm and dry.     Findings: No erythema or rash.  Neurological:     Mental Status: He is alert and oriented to person, place, and time.     Cranial Nerves: No cranial nerve  deficit.     Coordination: Coordination normal.  Psychiatric:        Behavior: Behavior normal.        Thought Content: Thought content normal.     CMP Latest Ref Rng & Units 05/01/2019  Glucose 65 - 99 mg/dL 87  BUN 6 - 24 mg/dL 5(L)  Creatinine 0.76 - 1.27 mg/dL 0.76  Sodium 134 - 144 mmol/L 141  Potassium 3.5 - 5.2 mmol/L 5.0  Chloride 96 - 106 mmol/L 102  CO2 20 - 29 mmol/L 24  Calcium 8.7 - 10.2 mg/dL 9.0  Total Protein 6.0 - 8.5  g/dL 7.3  Total Bilirubin 0.0 - 1.2 mg/dL 0.2  Alkaline Phos 39 - 117 IU/L 150(H)  AST 0 - 40 IU/L 40  ALT 0 - 44 IU/L 22   CBC Latest Ref Rng & Units 05/03/2019  WBC 4.0 - 10.5 K/uL 4.8  Hemoglobin 13.0 - 17.0 g/dL 69.613.5  Hematocrit 29.539.0 - 52.0 % 41.9  Platelets 150 - 400 K/uL 140(L)    US Abdomen Limited RUQ  Result Date: 04/27/2019 CLINICAL DATA:  History of alcoholic cirrhosis. EXAM: ULTRASOUND ABDOMEN LIMITED RIGHT UPPER QUADRANT COMPARISON:  MRI of the abdomen 09/26/2018. Abdominal ultrasound 07/09/2018. FINDINGS: Gallbladder: No gallstones or wall thickening visualized. No sonographic Murphy sign noted by sonographer. Polyps seen on prior ultrasound are not visible today. Common bile duct: Diameter: 0.3 cm Liver: Hyperechoic lesion measuring 1.8 cm consistent with a hemangioma is seen. Two small cysts measuring 1 cm and 0.7 cm in diameter are identified. No worrisome liver lesion. Echogenicity is normal. Portal vein is patent on color Doppler imaging with normal direction of blood flow towards the liver. Other: None. IMPRESSION: No acute abnormality. Negative for worrisome liver lesion. Small hemangioma is identified as seen on prior studies. Small gallbladder polyps seen on prior ultrasound are not visible today. No evidence of cholecystitis. Negative for gallstones. Electronically Signed   By: Drusilla Kannerhomas  Dalessio M.D.   On: 04/27/2019 09:46     Assessment and plan   1. Vitamin B12 deficiency   2. Folate deficiency   3. Anemia, unspecified  type   4. Alcohol use   5. Abnormal MRI   6. Thrombocytopenia (HCC)    #Anemia, Patient's hemoglobin has improved and is completely normalized.  Hemoglobin today is 13.5. Continue monitor.  #Chronic alcohol use, alcohol cessation was discussed with patient again. #Vitamin B12 deficiency.  Vitamin B12 level is pending today. Proceed with parenteral vitamin B12 injection today.  Continue oral vitamin B12 supplementation.  #Folate deficiency, continue oral folic acid supplementation.  Folate has been improved.  # Liver cirrhosis. Alcohol cessation discussed with patient.  Right upper quadrant ultrasound was scheduled in November 2020.  For screening of HCC. #Thrombocytopenia, likely secondary to alcohol use.  Continue to monitor. 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  Follow-up in 6 months.  We will schedule at the next visit.  Orders Placed This Encounter  Procedures  . CBC with Differential    Standing Status:   Future    Standing Expiration Date:   05/02/2020  . Ferritin    Standing Status:   Future    Standing Expiration Date:   05/02/2020  . Iron and TIBC    Standing Status:   Future    Standing Expiration Date:   05/02/2020  . Vitamin B12    Standing Status:   Future    Standing Expiration Date:   05/02/2020  . Folate    Standing Status:   Future    Standing Expiration Date:   05/02/2020      Follow-up in 6 months   Rickard PatienceZhou Webb Weed, MD, PhD Hematology Oncology [facility] Pager- [phone] 05/03/2019

## 2019-05-23 ENCOUNTER — Encounter: Payer: Self-pay | Admitting: Gastroenterology

## 2019-05-23 NOTE — Progress Notes (Signed)
Inform has elevated TSH meaning he needs evaluation of his thyroid.  He needs to see his primary care physician for the same.  Can we also inform his primary care physician's office about abnormal TSH.

## 2019-05-25 ENCOUNTER — Telehealth: Payer: Self-pay

## 2019-05-25 NOTE — Telephone Encounter (Signed)
-----   Message from Wyline Mood, MD sent at 05/23/2019  2:57 PM EST ----- Inform has elevated TSH meaning he needs evaluation of his thyroid.  He needs to see his primary care physician for the same.  Can we also inform his primary care physician's office about abnormal TSH.

## 2019-05-25 NOTE — Telephone Encounter (Signed)
Spoke with pt's relative Velna Hatchet, (on [place]) and informed her of pt's lab results and Dr. Johnney Killian recommendation. She understands and agrees to contact pt's PCP to abnormal TSH addressed. I have also faxed the results to pt's pcp.

## 2019-10-27 ENCOUNTER — Other Ambulatory Visit: Payer: Self-pay

## 2019-10-27 ENCOUNTER — Inpatient Hospital Stay: Payer: Medicare Other | Attending: Oncology

## 2019-10-27 DIAGNOSIS — K7460 Unspecified cirrhosis of liver: Secondary | ICD-10-CM | POA: Diagnosis not present

## 2019-10-27 DIAGNOSIS — D696 Thrombocytopenia, unspecified: Secondary | ICD-10-CM | POA: Diagnosis not present

## 2019-10-27 DIAGNOSIS — Z7289 Other problems related to lifestyle: Secondary | ICD-10-CM | POA: Diagnosis not present

## 2019-10-27 DIAGNOSIS — E538 Deficiency of other specified B group vitamins: Secondary | ICD-10-CM | POA: Diagnosis not present

## 2019-10-27 DIAGNOSIS — D649 Anemia, unspecified: Secondary | ICD-10-CM

## 2019-10-27 DIAGNOSIS — D509 Iron deficiency anemia, unspecified: Secondary | ICD-10-CM | POA: Diagnosis present

## 2019-10-27 LAB — FOLATE: Folate: 3.2 ng/mL — ABNORMAL LOW (ref >5.9)

## 2019-10-27 LAB — CBC WITH DIFFERENTIAL/PLATELET
Abs Immature Granulocytes: 0.02 10*3/uL (ref 0.00–0.07)
Basophils Absolute: 0.1 10*3/uL (ref 0.0–0.1)
Basophils Relative: 1 %
Eosinophils Absolute: 0.1 10*3/uL (ref 0.0–0.5)
Eosinophils Relative: 4 %
HCT: 36.7 % — ABNORMAL LOW (ref 39.0–52.0)
Hemoglobin: 12.3 g/dL — ABNORMAL LOW (ref 13.0–17.0)
Immature Granulocytes: 1 %
Lymphocytes Relative: 34 %
Lymphs Abs: 1.3 10*3/uL (ref 0.7–4.0)
MCH: 28.9 pg (ref 26.0–34.0)
MCHC: 33.5 g/dL (ref 30.0–36.0)
MCV: 86.2 fL (ref 80.0–100.0)
Monocytes Absolute: 0.5 10*3/uL (ref 0.1–1.0)
Monocytes Relative: 12 %
Neutro Abs: 1.8 10*3/uL (ref 1.7–7.7)
Neutrophils Relative %: 48 %
Platelets: 124 10*3/uL — ABNORMAL LOW (ref 150–400)
RBC: 4.26 MIL/uL (ref 4.22–5.81)
RDW: 14.8 % (ref 11.5–15.5)
WBC: 3.8 10*3/uL — ABNORMAL LOW (ref 4.0–10.5)
nRBC: 0 % (ref 0.0–0.2)

## 2019-10-27 LAB — VITAMIN B12: Vitamin B-12: 423 pg/mL (ref 180–914)

## 2019-10-27 LAB — IRON AND TIBC
Iron: 112 ug/dL (ref 45–182)
Saturation Ratios: 48 % — ABNORMAL HIGH (ref 17.9–39.5)
TIBC: 234 ug/dL — ABNORMAL LOW (ref 250–450)
UIBC: 122 ug/dL

## 2019-10-27 LAB — FERRITIN: Ferritin: 100 ng/mL (ref 24–336)

## 2019-10-30 ENCOUNTER — Encounter: Payer: Self-pay | Admitting: Oncology

## 2019-10-30 ENCOUNTER — Inpatient Hospital Stay: Payer: Medicare Other

## 2019-10-30 ENCOUNTER — Inpatient Hospital Stay (HOSPITAL_BASED_OUTPATIENT_CLINIC_OR_DEPARTMENT_OTHER): Payer: Medicare Other | Admitting: Oncology

## 2019-10-30 ENCOUNTER — Other Ambulatory Visit: Payer: Self-pay

## 2019-10-30 VITALS — BP 112/76 | HR 73 | Temp 96.1°F | Resp 18 | Wt 124.8 lb

## 2019-10-30 DIAGNOSIS — Z7289 Other problems related to lifestyle: Secondary | ICD-10-CM

## 2019-10-30 DIAGNOSIS — D649 Anemia, unspecified: Secondary | ICD-10-CM

## 2019-10-30 DIAGNOSIS — K7030 Alcoholic cirrhosis of liver without ascites: Secondary | ICD-10-CM

## 2019-10-30 DIAGNOSIS — D509 Iron deficiency anemia, unspecified: Secondary | ICD-10-CM | POA: Diagnosis not present

## 2019-10-30 DIAGNOSIS — Z789 Other specified health status: Secondary | ICD-10-CM

## 2019-10-30 DIAGNOSIS — E538 Deficiency of other specified B group vitamins: Secondary | ICD-10-CM

## 2019-10-30 MED ORDER — VITAMIN B-12 1000 MCG PO TABS: 1000.0000 ug | ORAL_TABLET | Freq: Every day | ORAL | 2 refills | Status: DC

## 2019-10-30 MED ORDER — FOLIC ACID 400 MCG PO TABS: 400.0000 ug | ORAL_TABLET | Freq: Every day | ORAL | 2 refills | Status: DC

## 2019-10-30 NOTE — Progress Notes (Signed)
Pt here for follow up. No new concerns voiced. MAR updated with medication bottles.

## 2019-10-30 NOTE — Progress Notes (Signed)
Hematology/Oncology Follow Up Note [facility]  Telephone:([phone] Fax:([phone]  Patient Care Team: Oval Linsey, MD as PCP - General (Internal Medicine)   Name of the patient: Trevor Lambert  [id number]  17-Sep-1960   REASON FOR VISIT  follow-up for anemia and thrombocytopenia, vitamin B12 and folic acid deficiency.  INTERVAL HISTORY Trevor Lambert is a 59 y.o.amale who has above oncology history reviewed by me today presented for follow up visit for management of microcytic anemia, bone lesion, alcohol use.  Patient reports feeling well today. He has no new complaints. He used to take vitamin B12 supplementation and has stopped since he ran out of the B12 tablets. He continues to drink 1 beer a day. Denies any fatigue, unintentional weight loss, fever, chills, nausea vomiting. Appetite is good, weight has been stable. .  Review of Systems  Constitutional: Negative for appetite change, chills, fatigue, fever and unexpected weight change.  HENT:   Negative for hearing loss and voice change.   Eyes: Negative for eye problems and icterus.  Respiratory: Negative for chest tightness, cough and shortness of breath.   Cardiovascular: Negative for chest pain and leg swelling.  Gastrointestinal: Negative for abdominal distention and abdominal pain.  Endocrine: Negative for hot flashes.  Genitourinary: Negative for difficulty urinating, dysuria and frequency.   Musculoskeletal: Negative for arthralgias.  Skin: Negative for itching and rash.  Neurological: Negative for light-headedness and numbness.  Hematological: Negative for adenopathy. Does not bruise/bleed easily.  Psychiatric/Behavioral: Negative for confusion.      No Known Allergies   Past Medical History:  Diagnosis Date  . Seizures (HCC)   . Vitamin B12 deficiency 02/03/2019     Past Surgical History:  Procedure Laterality Date  . ESOPHAGOGASTRODUODENOSCOPY (EGD) WITH PROPOFOL  N/A 11/07/2018   Procedure: ESOPHAGOGASTRODUODENOSCOPY (EGD) WITH PROPOFOL;  Surgeon: Wyline Mood, MD;  Location: [facility] ENDOSCOPY;  Service: Gastroenterology;  Laterality: N/A;  . NO PAST SURGERIES      Social History   Socioeconomic History  . Marital status: Single    Spouse name: Not on file  . Number of children: Not on file  . Years of education: Not on file  . Highest education level: Not on file  Occupational History  . Not on file  Tobacco Use  . Smoking status: Current Every Day Smoker    Packs/day: 0.50    Years: 41.00    Pack years: 20.50    Types: Cigarettes  . Smokeless tobacco: Never Used  Vaping Use  . Vaping Use: Never used  Substance and Sexual Activity  . Alcohol use: Yes    Alcohol/week: 4.0 standard drinks    Types: 4 Cans of beer per week  . Drug use: Not Currently  . Sexual activity: Not on file  Other Topics Concern  . Not on file  Social History Narrative  . Not on file   Social Determinants of Health   Financial Resource Strain:   . Difficulty of Paying Living Expenses:   Food Insecurity:   . Worried About Programme researcher, broadcasting/film/video in the Last Year:   . Barista in the Last Year:   Transportation Needs:   . Freight forwarder (Medical):   Marland Kitchen Lack of Transportation (Non-Medical):   Physical Activity:   . Days of Exercise per Week:   . Minutes of Exercise per Session:   Stress:   . Feeling of Stress :   Social Connections:   . Frequency of Communication  with Friends and Family:   . Frequency of Social Gatherings with Friends and Family:   . Attends Religious Services:   . Active Member of Clubs or Organizations:   . Attends Banker Meetings:   Marland Kitchen Marital Status:   Intimate Partner Violence:   . Fear of Current or Ex-Partner:   . Emotionally Abused:   Marland Kitchen Physically Abused:   . Sexually Abused:     Family History  Problem Relation Age of Onset  . Cancer Neg Hx      Current Outpatient Medications:  .  albuterol  (VENTOLIN HFA) 108 (90 Base) MCG/ACT inhaler, , Disp: , Rfl:  .  DILANTIN 100 MG ER capsule, Take 100 mg by mouth 3 (three) times daily., Disp: , Rfl:  .  folic acid (FOLVITE) 400 MCG tablet, Take 1 tablet (400 mcg total) by mouth daily., Disp: 90 tablet, Rfl: 2 .  levETIRAcetam (KEPPRA) 500 MG tablet, Take 500 mg by mouth daily. , Disp: , Rfl:  .  simvastatin (ZOCOR) 20 MG tablet, Take 20 mg by mouth daily at 6 PM. , Disp: , Rfl:  .  vitamin B-12 (CYANOCOBALAMIN) 1000 MCG tablet, Take 1 tablet (1,000 mcg total) by mouth daily., Disp: 90 tablet, Rfl: 2  Physical exam: ECOG 1 Vitals:   10/30/19 1330  BP: 112/76  Pulse: 73  Resp: 18  Temp: (!) 96.1 F (35.6 C)  Weight: 124 lb 12.8 oz (56.6 kg)   Physical Exam Constitutional:      General: He is not in acute distress. HENT:     Head: Normocephalic and atraumatic.  Eyes:     General: No scleral icterus.    Pupils: Pupils are equal, round, and reactive to light.  Cardiovascular:     Rate and Rhythm: Normal rate and regular rhythm.     Heart sounds: Normal heart sounds.  Pulmonary:     Effort: Pulmonary effort is normal. No respiratory distress.     Breath sounds: No wheezing.  Abdominal:     General: Bowel sounds are normal. There is no distension.     Palpations: Abdomen is soft. There is no mass.     Tenderness: There is no abdominal tenderness.  Musculoskeletal:        General: No deformity. Normal range of motion.     Cervical back: Normal range of motion and neck supple.  Skin:    General: Skin is warm and dry.     Findings: No erythema or rash.  Neurological:     Mental Status: He is alert and oriented to person, place, and time. Mental status is at baseline.     Cranial Nerves: No cranial nerve deficit.     Coordination: Coordination normal.  Psychiatric:        Mood and Affect: Mood normal.        Behavior: Behavior normal.        Thought Content: Thought content normal.     CMP Latest Ref Rng & Units 05/01/2019   Glucose 65 - 99 mg/dL 87  BUN 6 - 24 mg/dL 5(L)  Creatinine 8.78 - 1.27 mg/dL 6.76  Sodium 720 - 947 mmol/L 141  Potassium 3.5 - 5.2 mmol/L 5.0  Chloride 96 - 106 mmol/L 102  CO2 20 - 29 mmol/L 24  Calcium 8.7 - 10.2 mg/dL 9.0  Total Protein 6.0 - 8.5 g/dL 7.3  Total Bilirubin 0.0 - 1.2 mg/dL 0.2  Alkaline Phos 39 - 117 IU/L 150(H)  AST 0 -  40 IU/L 40  ALT 0 - 44 IU/L 22   CBC Latest Ref Rng & Units 10/27/2019  WBC 4.0 - 10.5 K/uL 3.8(L)  Hemoglobin 13.0 - 17.0 g/dL 12.3(L)  Hematocrit 39 - 52 % 36.7(L)  Platelets 150 - 400 K/uL 124(L)    No results found.   Assessment and plan   1. Vitamin B12 deficiency   2. Folate deficiency   3. Anemia, unspecified type   4. Alcohol use    #Anemia, Mild anemia, hemoglobin 12.3.  MCV 86.  #Folate deficiency, recommend folic acid 086 MCG daily, prescription was sent to pharmacy. #History of vitamin B12 deficiency, vitamin B12 level is normal today.  Recommend continue oral vitamin B12 supplementation.  Prescription sent to pharmacy. #Chronic alcohol use, alcohol cessation was discussed with patient. Liver cirrhosis, alcohol cessation, Screening for [place].  04/27/2019 right upper quadrant ultrasound negative.  Check for AFP at the next visit. Recommend patient to continue follow-up with gastroenterology.  #Thrombocytopenia, likely secondary to alcohol use.  Continue to monitor. 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  Follow-up in 6 months.  We will schedule at the next visit.  Orders Placed This Encounter  Procedures  . Folate    Standing Status:   Future    Standing Expiration Date:   10/29/2020  . Vitamin B12    Standing Status:   Future    Standing Expiration Date:   10/29/2020  . CBC with Differential/Platelet    Standing Status:   Future    Standing Expiration Date:   10/29/2020   . Comprehensive metabolic panel    Standing Status:   Future    Standing Expiration Date:   10/29/2020  . Retic Panel    Standing Status:   Future    Standing Expiration Date:   10/29/2020  . AFP tumor marker    Standing Status:   Future    Standing Expiration Date:   10/29/2020      Follow-up in 6 months   Earlie Server, MD, PhD Hematology Oncology [facility] at [facility] Pager- [phone] 10/30/2019

## 2020-05-06 ENCOUNTER — Inpatient Hospital Stay: Payer: Medicare Other

## 2020-05-08 ENCOUNTER — Inpatient Hospital Stay: Payer: Medicare Other | Admitting: Oncology

## 2020-06-03 ENCOUNTER — Inpatient Hospital Stay: Payer: Medicare Other

## 2020-06-05 ENCOUNTER — Inpatient Hospital Stay: Payer: Medicare Other | Admitting: Oncology

## 2020-06-07 ENCOUNTER — Other Ambulatory Visit: Payer: Self-pay

## 2020-06-07 ENCOUNTER — Inpatient Hospital Stay: Payer: Medicare Other | Attending: Oncology

## 2020-06-07 DIAGNOSIS — M899 Disorder of bone, unspecified: Secondary | ICD-10-CM | POA: Diagnosis not present

## 2020-06-07 DIAGNOSIS — D696 Thrombocytopenia, unspecified: Secondary | ICD-10-CM | POA: Insufficient documentation

## 2020-06-07 DIAGNOSIS — Z789 Other specified health status: Secondary | ICD-10-CM

## 2020-06-07 DIAGNOSIS — Z79899 Other long term (current) drug therapy: Secondary | ICD-10-CM | POA: Diagnosis not present

## 2020-06-07 DIAGNOSIS — R634 Abnormal weight loss: Secondary | ICD-10-CM | POA: Insufficient documentation

## 2020-06-07 DIAGNOSIS — F17210 Nicotine dependence, cigarettes, uncomplicated: Secondary | ICD-10-CM | POA: Diagnosis not present

## 2020-06-07 DIAGNOSIS — E538 Deficiency of other specified B group vitamins: Secondary | ICD-10-CM | POA: Insufficient documentation

## 2020-06-07 DIAGNOSIS — K7030 Alcoholic cirrhosis of liver without ascites: Secondary | ICD-10-CM | POA: Insufficient documentation

## 2020-06-07 DIAGNOSIS — Z7289 Other problems related to lifestyle: Secondary | ICD-10-CM | POA: Insufficient documentation

## 2020-06-07 DIAGNOSIS — D649 Anemia, unspecified: Secondary | ICD-10-CM | POA: Insufficient documentation

## 2020-06-07 LAB — CBC WITH DIFFERENTIAL/PLATELET
Abs Immature Granulocytes: 0.02 10*3/uL (ref 0.00–0.07)
Basophils Absolute: 0.1 10*3/uL (ref 0.0–0.1)
Basophils Relative: 1 %
Eosinophils Absolute: 0.1 10*3/uL (ref 0.0–0.5)
Eosinophils Relative: 2 %
HCT: 33.3 % — ABNORMAL LOW (ref 39.0–52.0)
Hemoglobin: 11.2 g/dL — ABNORMAL LOW (ref 13.0–17.0)
Immature Granulocytes: 0 %
Lymphocytes Relative: 36 %
Lymphs Abs: 1.8 10*3/uL (ref 0.7–4.0)
MCH: 27.8 pg (ref 26.0–34.0)
MCHC: 33.6 g/dL (ref 30.0–36.0)
MCV: 82.6 fL (ref 80.0–100.0)
Monocytes Absolute: 0.5 10*3/uL (ref 0.1–1.0)
Monocytes Relative: 11 %
Neutro Abs: 2.5 10*3/uL (ref 1.7–7.7)
Neutrophils Relative %: 50 %
Platelets: 229 10*3/uL (ref 150–400)
RBC: 4.03 MIL/uL — ABNORMAL LOW (ref 4.22–5.81)
RDW: 16.3 % — ABNORMAL HIGH (ref 11.5–15.5)
WBC: 5 10*3/uL (ref 4.0–10.5)
nRBC: 0 % (ref 0.0–0.2)

## 2020-06-07 LAB — RETIC PANEL
Immature Retic Fract: 3.7 % (ref 2.3–15.9)
RBC.: 3.92 MIL/uL — ABNORMAL LOW (ref 4.22–5.81)
Retic Count, Absolute: 18 10*3/uL — ABNORMAL LOW (ref 19.0–186.0)
Retic Ct Pct: 0.5 % (ref 0.4–3.1)
Reticulocyte Hemoglobin: 30.3 pg (ref >27.9)

## 2020-06-07 LAB — COMPREHENSIVE METABOLIC PANEL
ALT: 20 U/L (ref 0–44)
AST: 35 U/L (ref 15–41)
Albumin: 2.6 g/dL — ABNORMAL LOW (ref 3.5–5.0)
Alkaline Phosphatase: 154 U/L — ABNORMAL HIGH (ref 38–126)
Anion gap: 7 (ref 5–15)
BUN: <5 mg/dL — ABNORMAL LOW (ref 6–20)
CO2: 28 mmol/L (ref 22–32)
Calcium: 8.3 mg/dL — ABNORMAL LOW (ref 8.9–10.3)
Chloride: 99 mmol/L (ref 98–111)
Creatinine, Ser: 0.72 mg/dL (ref 0.61–1.24)
GFR, Estimated: >60 mL/min (ref >60)
Glucose, Bld: 83 mg/dL (ref 70–99)
Potassium: 3.8 mmol/L (ref 3.5–5.1)
Sodium: 134 mmol/L — ABNORMAL LOW (ref 135–145)
Total Bilirubin: 0.3 mg/dL (ref 0.3–1.2)
Total Protein: 7.5 g/dL (ref 6.5–8.1)

## 2020-06-07 LAB — VITAMIN B12: Vitamin B-12: 862 pg/mL (ref 180–914)

## 2020-06-07 LAB — FOLATE: Folate: 14.1 ng/mL (ref >5.9)

## 2020-06-08 LAB — AFP TUMOR MARKER: AFP, Serum, Tumor Marker: 1.6 ng/mL (ref 0.0–8.3)

## 2020-06-11 ENCOUNTER — Encounter: Payer: Self-pay | Admitting: Oncology

## 2020-06-11 ENCOUNTER — Inpatient Hospital Stay (HOSPITAL_BASED_OUTPATIENT_CLINIC_OR_DEPARTMENT_OTHER): Payer: Medicare Other | Admitting: Oncology

## 2020-06-11 VITALS — BP 139/55 | HR 99 | Temp 96.8°F | Resp 18 | Wt 111.4 lb

## 2020-06-11 DIAGNOSIS — Z7289 Other problems related to lifestyle: Secondary | ICD-10-CM

## 2020-06-11 DIAGNOSIS — M899 Disorder of bone, unspecified: Secondary | ICD-10-CM | POA: Diagnosis not present

## 2020-06-11 DIAGNOSIS — D649 Anemia, unspecified: Secondary | ICD-10-CM | POA: Diagnosis not present

## 2020-06-11 DIAGNOSIS — Z789 Other specified health status: Secondary | ICD-10-CM

## 2020-06-11 DIAGNOSIS — E538 Deficiency of other specified B group vitamins: Secondary | ICD-10-CM | POA: Diagnosis not present

## 2020-06-11 DIAGNOSIS — D696 Thrombocytopenia, unspecified: Secondary | ICD-10-CM | POA: Diagnosis not present

## 2020-06-11 MED ORDER — FOLIC ACID 1 MG PO TABS: 1.0000 mg | ORAL_TABLET | Freq: Every day | ORAL | 2 refills | Status: DC

## 2020-06-11 NOTE — Progress Notes (Signed)
Patient here for follow up. 13 pound weight loss noted since last visit 6 months ago. Pt reports that he has not been trying to loose weight and he has been eating well.

## 2020-06-11 NOTE — Progress Notes (Signed)
Hematology/Oncology Follow Up Note [facility]  [phone] Fax:([phone]  Patient Care Team: Oval Linsey, MD as PCP - General (Internal Medicine)   Name of the patient: Trevor Lambert  [id number]  21-May-1960   REASON FOR VISIT  follow-up for anemia and thrombocytopenia, vitamin B12 and folic acid deficiency.  INTERVAL HISTORY Trevor Lambert is a 60 y.o.[place]ale who has above oncology history reviewed by me today presented for follow up visit for management of microcytic anemia, bone lesion, alcohol use.  Patient reports feeling well today. He has no new complaints. He reports taking Folic acid daily. As well as B12 supplementation.  Continues to drink 1 beer per day.  Denies weight loss, fever, chills, fatigue, night sweats.  Appetite is good, he has lost 13 pounds since last visit.  .  Review of Systems  Constitutional: Positive for unexpected weight change. Negative for appetite change, chills, fatigue and fever.  HENT:   Negative for hearing loss and voice change.   Eyes: Negative for eye problems and icterus.  Respiratory: Negative for chest tightness, cough and shortness of breath.   Cardiovascular: Negative for chest pain and leg swelling.  Gastrointestinal: Negative for abdominal distention and abdominal pain.  Endocrine: Negative for hot flashes.  Genitourinary: Negative for difficulty urinating, dysuria and frequency.   Musculoskeletal: Negative for arthralgias.  Skin: Negative for itching and rash.  Neurological: Negative for light-headedness and numbness.  Hematological: Negative for adenopathy. Does not bruise/bleed easily.  Psychiatric/Behavioral: Negative for confusion.      No Known Allergies   Past Medical History:  Diagnosis Date  . Seizures (HCC)   . Vitamin B12 deficiency 02/03/2019     Past Surgical History:  Procedure Laterality Date  . ESOPHAGOGASTRODUODENOSCOPY (EGD) WITH PROPOFOL N/A  11/07/2018   Procedure: ESOPHAGOGASTRODUODENOSCOPY (EGD) WITH PROPOFOL;  Surgeon: Wyline Mood, MD;  Location: [facility] ENDOSCOPY;  Service: Gastroenterology;  Laterality: N/A;  . NO PAST SURGERIES      Social History   Socioeconomic History  . Marital status: Single    Spouse name: Not on file  . Number of children: Not on file  . Years of education: Not on file  . Highest education level: Not on file  Occupational History  . Not on file  Tobacco Use  . Smoking status: Current Every Day Smoker    Packs/day: 0.50    Years: 41.00    Pack years: 20.50    Types: Cigarettes  . Smokeless tobacco: Never Used  Vaping Use  . Vaping Use: Never used  Substance and Sexual Activity  . Alcohol use: Yes    Alcohol/week: 4.0 standard drinks    Types: 4 Cans of beer per week  . Drug use: Not Currently  . Sexual activity: Not on file  Other Topics Concern  . Not on file  Social History Narrative  . Not on file   Social Determinants of Health   Financial Resource Strain: Not on file  Food Insecurity: Not on file  Transportation Needs: Not on file  Physical Activity: Not on file  Stress: Not on file  Social Connections: Not on file  Intimate Partner Violence: Not on file    Family History  Problem Relation Age of Onset  . Cancer Neg Hx      Current Outpatient Medications:  .  albuterol (VENTOLIN HFA) 108 (90 Base) MCG/ACT inhaler, , Disp: , Rfl:  .  DILANTIN 100 MG ER capsule, Take 100 mg by mouth 3 (three)  times daily., Disp: , Rfl:  .  folic acid (FOLVITE) 1 MG tablet, Take 1 tablet (1 mg total) by mouth daily., Disp: 30 tablet, Rfl: 2 .  levETIRAcetam (KEPPRA) 500 MG tablet, Take 500 mg by mouth daily. , Disp: , Rfl:  .  simvastatin (ZOCOR) 20 MG tablet, Take 20 mg by mouth daily at 6 PM. , Disp: , Rfl:  .  sulfacetamide (BLEPH-10) 10 % ophthalmic solution, SMARTSIG:In Eye(s), Disp: , Rfl:  .  vitamin B-12 (CYANOCOBALAMIN) 1000 MCG tablet, Take 1 tablet (1,000 mcg total) by mouth  daily., Disp: 90 tablet, Rfl: 2  Physical exam: ECOG 1 Vitals:   06/11/20 1114  BP: (!) 139/55  Pulse: 99  Resp: 18  Temp: (!) 96.8 F (36 C)  Weight: 111 lb 6.4 oz (50.5 kg)   Physical Exam Constitutional:      General: He is not in acute distress. HENT:     Head: Normocephalic and atraumatic.  Eyes:     General: No scleral icterus.    Pupils: Pupils are equal, round, and reactive to light.  Cardiovascular:     Rate and Rhythm: Normal rate and regular rhythm.     Heart sounds: Normal heart sounds.  Pulmonary:     Effort: Pulmonary effort is normal. No respiratory distress.     Breath sounds: No wheezing.  Abdominal:     General: Bowel sounds are normal. There is no distension.     Palpations: Abdomen is soft. There is no mass.     Tenderness: There is no abdominal tenderness.  Musculoskeletal:        General: No deformity. Normal range of motion.     Cervical back: Normal range of motion and neck supple.  Skin:    General: Skin is warm and dry.     Findings: No erythema or rash.  Neurological:     Mental Status: He is alert and oriented to person, place, and time. Mental status is at baseline.     Cranial Nerves: No cranial nerve deficit.     Coordination: Coordination normal.  Psychiatric:        Mood and Affect: Mood normal.        Behavior: Behavior normal.        Thought Content: Thought content normal.     CMP Latest Ref Rng & Units 06/07/2020  Glucose 70 - 99 mg/dL 83  BUN 6 - 20 mg/dL <5(H)  Creatinine 2.99 - 1.24 mg/dL 2.42  Sodium 683 - 419 mmol/L 134(L)  Potassium 3.5 - 5.1 mmol/L 3.8  Chloride 98 - 111 mmol/L 99  CO2 22 - 32 mmol/L 28  Calcium 8.9 - 10.3 mg/dL 8.3(L)  Total Protein 6.5 - 8.1 g/dL 7.5  Total Bilirubin 0.3 - 1.2 mg/dL 0.3  Alkaline Phos 38 - 126 U/L 154(H)  AST 15 - 41 U/L 35  ALT 0 - 44 U/L 20   CBC Latest Ref Rng & Units 06/07/2020  WBC 4.0 - 10.5 K/uL 5.0  Hemoglobin 13.0 - 17.0 g/dL 11.2(L)  Hematocrit 39.0 - 52.0 % 33.3(L)   Platelets 150 - 400 K/uL 229    No results found.   Assessment and plan   1. Vitamin B12 deficiency   2. Folate deficiency   3. Bone lesion   4. Thrombocytopenia (HCC)   5. Alcohol use    #Anemia, Mild anemia, hemoglobin 11.2, worse.   # Folate deficiency, folate level is low at 3.2  recommend him to stop [employer]  daily, switch to Folvite 1mg  daily. Rx sent.  # History of B12 deficiency, level is stable. Continue Vitamin B12  daily.   #Chronic alcohol use, alcohol cessation was discussed with patient. Screening for HCC.  04/27/2019 right upper quadrant ultrasound negative.  Check for AFP at the next visit.  #Thrombocytopenia, resolved. Monitor.  # Weight loss, low albumin level. Recommend him to take nutrition supplementation. 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  Repeat MRI thoracic spine.   Orders Placed This Encounter  Procedures  . MR Thoracic Spine W Wo Contrast    Standing Status:   Future    Standing Expiration Date:   06/11/2021    Order Specific Question:   GRA to provide read?    Answer:   Yes    Order Specific Question:   If indicated for the ordered procedure, I authorize the administration of contrast media per Radiology protocol    Answer:   Yes    Order Specific Question:   What is the patient's sedation requirement?    Answer:   No Sedation    Order Specific Question:   Use SRS Protocol?    Answer:   Yes    Order Specific Question:   Does the patient have a pacemaker or implanted devices?    Answer:   No    Order Specific Question:   Preferred imaging location?    Answer:   [facility] (table limit - 550lbs)  . CBC with Differential/Platelet    Standing Status:   Future    Standing Expiration Date:   06/11/2021  . Comprehensive metabolic panel    Standing Status:    Future    Standing Expiration Date:   06/11/2021  . Folate    Standing Status:   Future    Standing Expiration Date:   06/11/2021  . TSH    Standing Status:   Future    Standing Expiration Date:   06/11/2021  . Lactate dehydrogenase    Standing Status:   Future    Standing Expiration Date:   06/11/2021      Follow-up in 3 months   06/13/2021, MD, PhD Hematology Oncology [facility] Pager- [facility] 06/11/2020

## 2020-06-19 ENCOUNTER — Other Ambulatory Visit: Payer: Self-pay

## 2020-06-19 ENCOUNTER — Ambulatory Visit
Admission: RE | Admit: 2020-06-19 | Discharge: 2020-06-19 | Disposition: A | Discharge status: Home / Self Care | Payer: Medicare Other | Source: Ambulatory Visit | Attending: Oncology | Admitting: Oncology

## 2020-06-19 DIAGNOSIS — M899 Disorder of bone, unspecified: Secondary | ICD-10-CM | POA: Diagnosis present

## 2020-06-19 IMAGING — MR MR THORACIC SPINE WO/W CM
8 of 10 series · 28 of 48 positions shown · IV contrast (5ml Gadavist)
Comparison: [DATE]

CLINICAL DATA: Follow-up thoracic lesion.

EXAM:
MRI THORACIC WITHOUT AND WITH CONTRAST
TECHNIQUE: Multiplanar and multiecho pulse sequences of the thoracic spine were
obtained without and with intravenous contrast.
CONTRAST:  5mL GADAVIST GADOBUTROL 1 MMOL/ML IV SOLN

[Series 18: T1 · sagittal · 6.0mm · 1.88mm/px · 1 of 9 slices shown (1 of 3)]
[im 1/9]
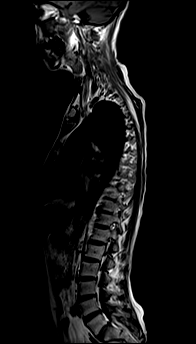

[Series 19: T2 · sagittal · 3.0mm · 1.06mm/px · 3 of 17 slices shown (1 of 2)]
[im 1/17]
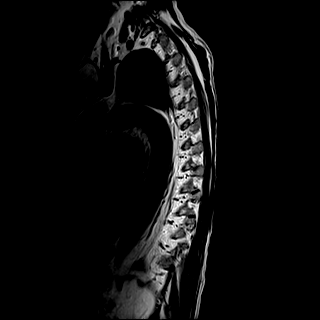
[im 9/17]
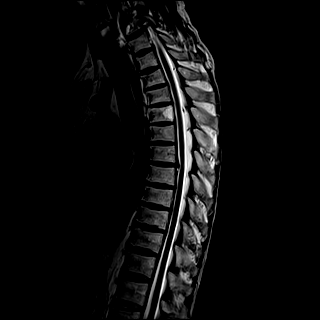
[im 17/17]
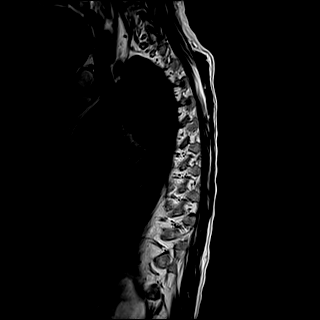

[Series 20: T1 · sagittal · 3.0mm · 1.06mm/px · 3 of 17 slices shown (2 of 3)]
[im 1/17]
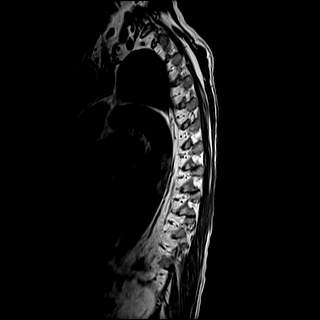
[im 9/17]
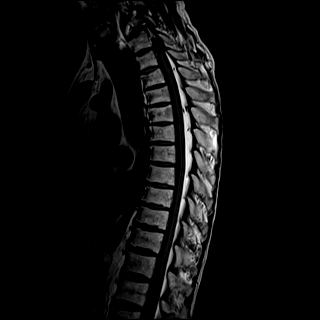
[im 17/17]
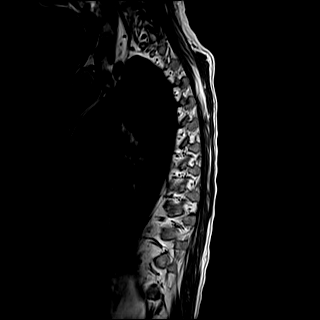

[Series 21: STIR · sagittal · 3.0mm · 0.53mm/px · 3 of 17 slices shown]
[im 1/17]
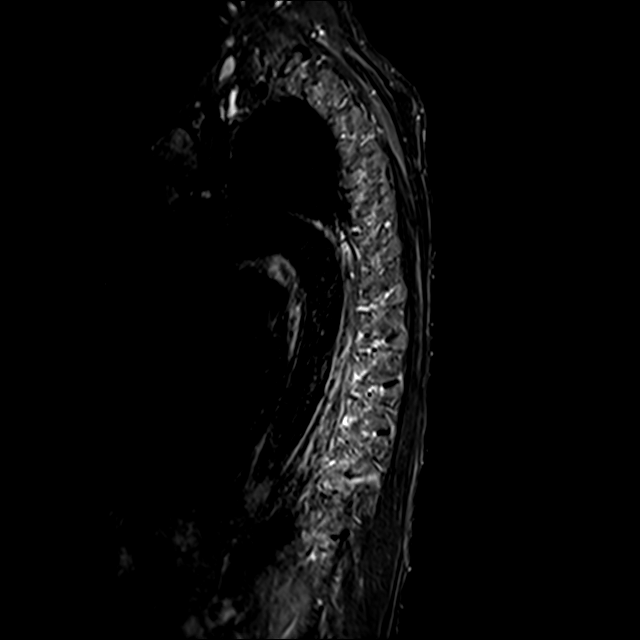
[im 9/17]
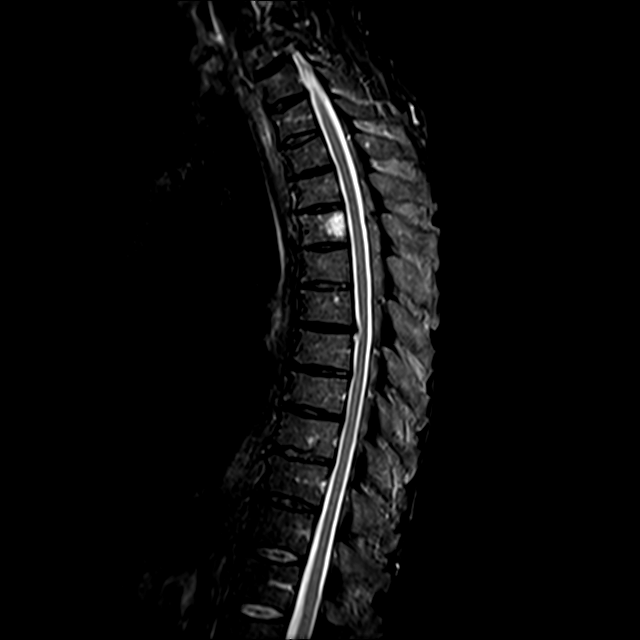
[im 17/17]
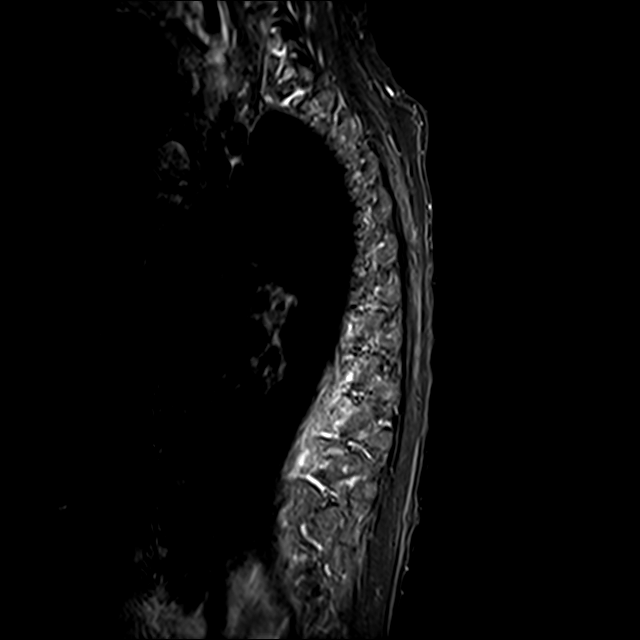

[Series 22: T2 · axial · 4.0mm · 0.59mm/px · z∈[-300,-94]mm · 7 of 42 slices shown (2 of 2)]
[im 1/42]
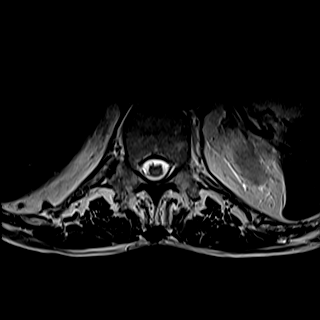
[im 7/42]
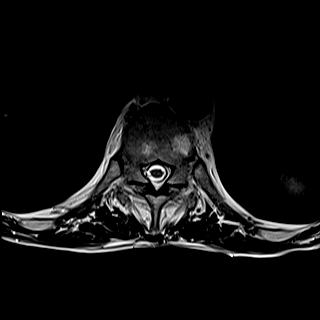
[im 14/42]
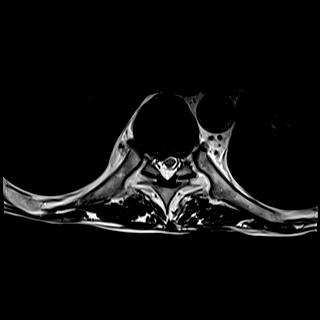
[im 21/42]
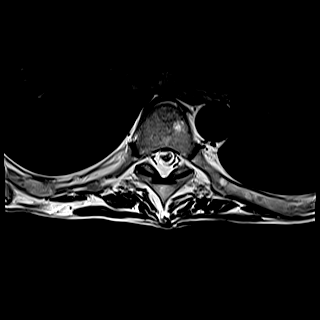
[im 28/42]
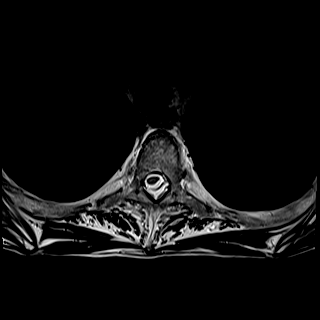
[im 35/42]
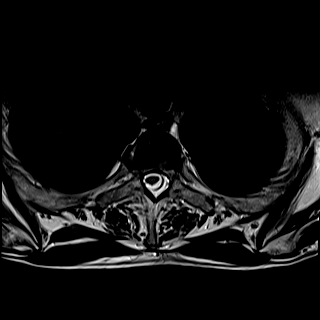
[im 42/42]
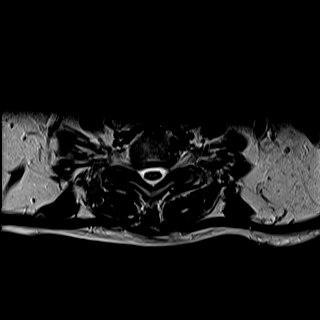

[Series 24: T1 · axial · non-contrast · 4.0mm · 0.37mm/px · z∈[-300,-94]mm · 7 of 42 slices shown (3 of 3)]
[im 1/42]
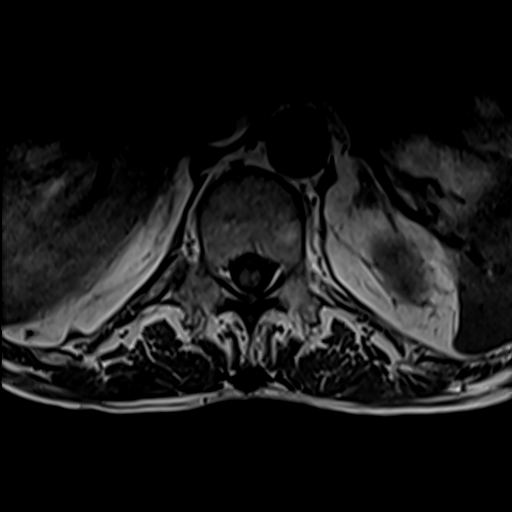
[im 7/42]
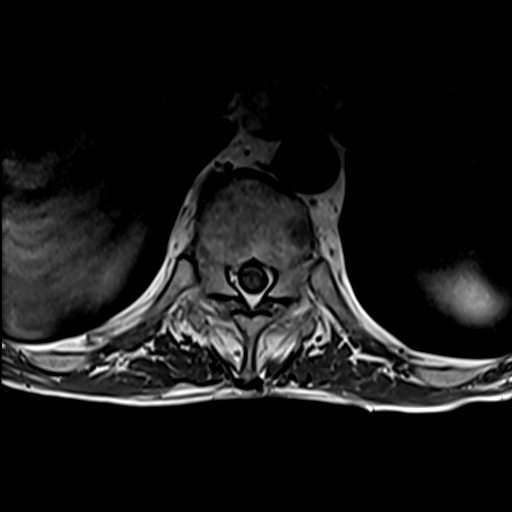
[im 14/42]
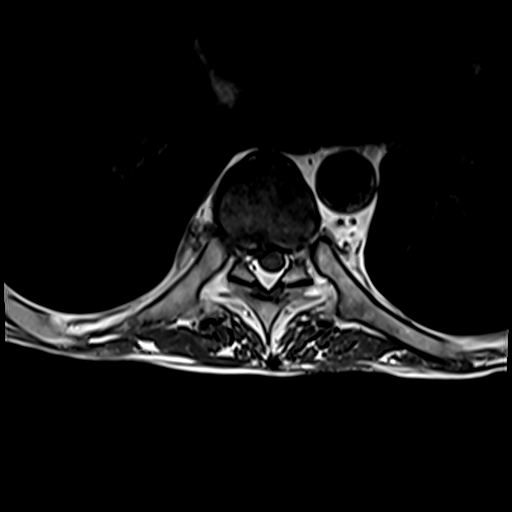
[im 21/42]
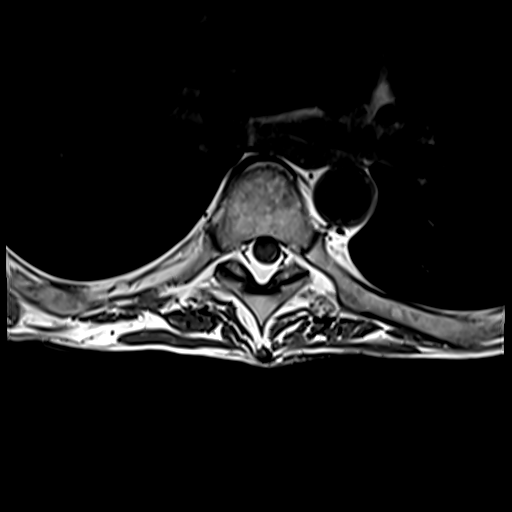
[im 28/42]
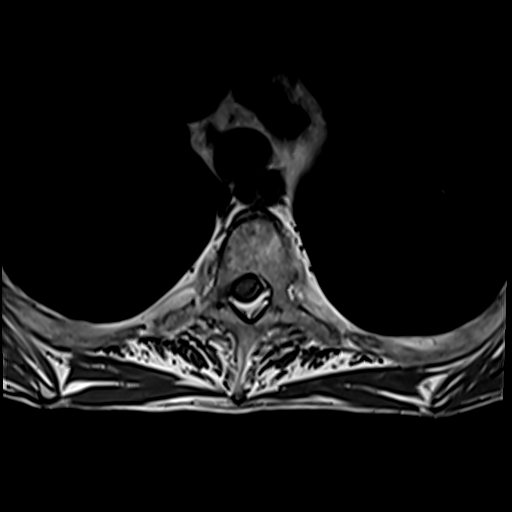
[im 35/42]
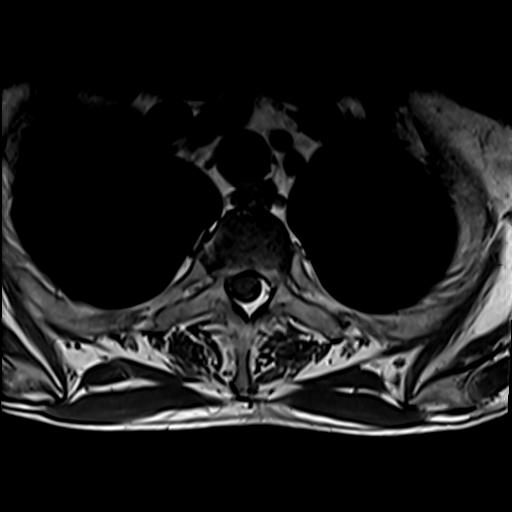
[im 42/42]
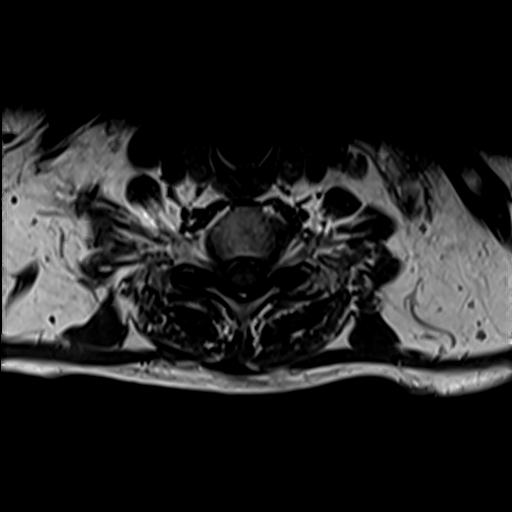

[Series 26: T1 post-contrast · axial · 4.0mm · 0.37mm/px · 1 of 42 slices shown]
[im 1/42]
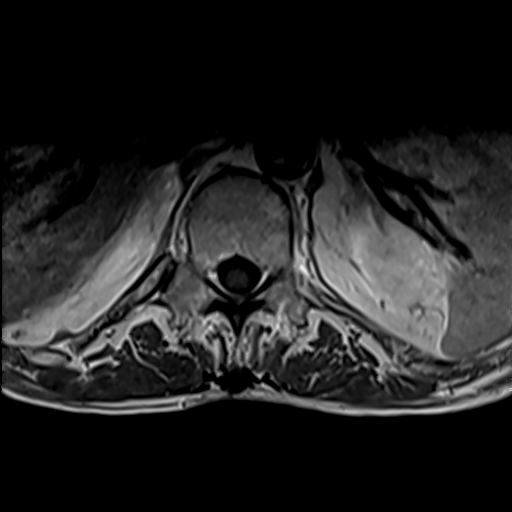

[Series 27: T1 fat-sat post-contrast · sagittal · 3.0mm · 1.06mm/px · 3 of 17 slices shown]
[im 1/17]
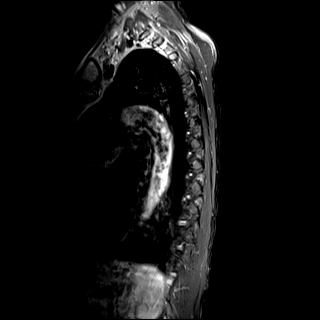
[im 9/17]
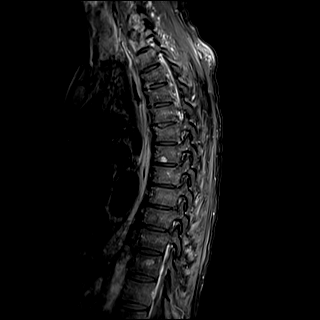
[im 17/17]
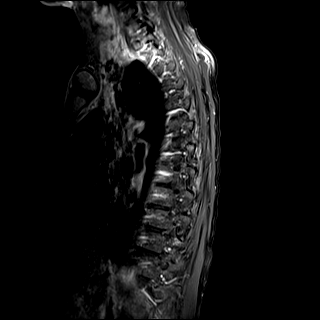

[28 of 48 positions shown; findings below may reference images not displayed]

FINDINGS: Alignment:  Mild levocurvature.

Vertebrae: Lesions in the T5 and T12 vertebral body of unchanged
size and signal characteristics, STIR hyperintense and T1
isointense. The larger lesion, at T5, has a corduroy appearance.
Appearance remains consistent with lipid poor hemangiomas. No
evidence of aggressive bone lesion or fracture.

Cord:  Normal signal and morphology.  Thickened dorsal epidural fat.

Paraspinal and other soft tissues: Reticulation in the right upper
lobe which was also suggested on a [DATE] chest x-ray,
presumed scarring.

Disc levels:

Multilevel thoracic disc space narrowing and desiccation.

T7-8 central disc protrusion contacting the ventral cord without
compression.

T9-8 right paracentral protrusion contacting the ventral cord.

Diffusely patent foramina
IMPRESSION: 1. Stable appearance of presumed hemangiomas at T5 and T12. No
aggressive bone lesion.
2. Small, chronic disc protrusions at T7-8 and T9-10.

## 2020-06-19 MED ORDER — GADOBUTROL 1 MMOL/ML IV SOLN
5.0000 mL | Freq: Once | INTRAVENOUS | Status: AC | PRN
  Administered 2020-06-19: 5 mL via INTRAVENOUS

## 2020-09-11 ENCOUNTER — Inpatient Hospital Stay: Payer: Medicare Other | Attending: Oncology

## 2020-09-11 ENCOUNTER — Other Ambulatory Visit: Payer: Self-pay

## 2020-09-11 DIAGNOSIS — E538 Deficiency of other specified B group vitamins: Secondary | ICD-10-CM | POA: Insufficient documentation

## 2020-09-11 DIAGNOSIS — K7030 Alcoholic cirrhosis of liver without ascites: Secondary | ICD-10-CM | POA: Diagnosis not present

## 2020-09-11 DIAGNOSIS — F17210 Nicotine dependence, cigarettes, uncomplicated: Secondary | ICD-10-CM | POA: Diagnosis not present

## 2020-09-11 DIAGNOSIS — D696 Thrombocytopenia, unspecified: Secondary | ICD-10-CM | POA: Diagnosis not present

## 2020-09-11 DIAGNOSIS — Z7289 Other problems related to lifestyle: Secondary | ICD-10-CM | POA: Diagnosis not present

## 2020-09-11 DIAGNOSIS — Z79899 Other long term (current) drug therapy: Secondary | ICD-10-CM | POA: Insufficient documentation

## 2020-09-11 LAB — CBC WITH DIFFERENTIAL/PLATELET
Abs Immature Granulocytes: 0.02 10*3/uL (ref 0.00–0.07)
Basophils Absolute: 0.1 10*3/uL (ref 0.0–0.1)
Basophils Relative: 1 %
Eosinophils Absolute: 0.1 10*3/uL (ref 0.0–0.5)
Eosinophils Relative: 1 %
HCT: 33.4 % — ABNORMAL LOW (ref 39.0–52.0)
Hemoglobin: 10.9 g/dL — ABNORMAL LOW (ref 13.0–17.0)
Immature Granulocytes: 0 %
Lymphocytes Relative: 16 %
Lymphs Abs: 1.1 10*3/uL (ref 0.7–4.0)
MCH: 27.8 pg (ref 26.0–34.0)
MCHC: 32.6 g/dL (ref 30.0–36.0)
MCV: 85.2 fL (ref 80.0–100.0)
Monocytes Absolute: 0.7 10*3/uL (ref 0.1–1.0)
Monocytes Relative: 9 %
Neutro Abs: 5.1 10*3/uL (ref 1.7–7.7)
Neutrophils Relative %: 73 %
Platelets: 135 10*3/uL — ABNORMAL LOW (ref 150–400)
RBC: 3.92 MIL/uL — ABNORMAL LOW (ref 4.22–5.81)
RDW: 15.2 % (ref 11.5–15.5)
WBC: 7 10*3/uL (ref 4.0–10.5)
nRBC: 0 % (ref 0.0–0.2)

## 2020-09-11 LAB — COMPREHENSIVE METABOLIC PANEL
ALT: 29 U/L (ref 0–44)
AST: 53 U/L — ABNORMAL HIGH (ref 15–41)
Albumin: 3.3 g/dL — ABNORMAL LOW (ref 3.5–5.0)
Alkaline Phosphatase: 119 U/L (ref 38–126)
Anion gap: 11 (ref 5–15)
BUN: 5 mg/dL — ABNORMAL LOW (ref 6–20)
CO2: 28 mmol/L (ref 22–32)
Calcium: 8.5 mg/dL — ABNORMAL LOW (ref 8.9–10.3)
Chloride: 99 mmol/L (ref 98–111)
Creatinine, Ser: 0.81 mg/dL (ref 0.61–1.24)
GFR, Estimated: >60 mL/min (ref >60)
Glucose, Bld: 83 mg/dL (ref 70–99)
Potassium: 4.3 mmol/L (ref 3.5–5.1)
Sodium: 138 mmol/L (ref 135–145)
Total Bilirubin: 0.5 mg/dL (ref 0.3–1.2)
Total Protein: 7.5 g/dL (ref 6.5–8.1)

## 2020-09-11 LAB — TSH: TSH: 6.37 u[IU]/mL — ABNORMAL HIGH (ref 0.350–4.500)

## 2020-09-11 LAB — FOLATE: Folate: 6.2 ng/mL (ref >5.9)

## 2020-09-11 LAB — LACTATE DEHYDROGENASE: LDH: 120 U/L (ref 98–192)

## 2020-09-13 ENCOUNTER — Other Ambulatory Visit: Payer: Self-pay

## 2020-09-13 ENCOUNTER — Encounter: Payer: Self-pay | Admitting: Oncology

## 2020-09-13 ENCOUNTER — Inpatient Hospital Stay (HOSPITAL_BASED_OUTPATIENT_CLINIC_OR_DEPARTMENT_OTHER): Payer: Medicare Other | Admitting: Oncology

## 2020-09-13 VITALS — BP 109/83 | HR 90 | Temp 98.0°F | Resp 16 | Wt 117.6 lb

## 2020-09-13 DIAGNOSIS — R768 Other specified abnormal immunological findings in serum: Secondary | ICD-10-CM

## 2020-09-13 DIAGNOSIS — K7030 Alcoholic cirrhosis of liver without ascites: Secondary | ICD-10-CM | POA: Diagnosis not present

## 2020-09-13 DIAGNOSIS — D649 Anemia, unspecified: Secondary | ICD-10-CM

## 2020-09-13 DIAGNOSIS — Z789 Other specified health status: Secondary | ICD-10-CM

## 2020-09-13 DIAGNOSIS — E538 Deficiency of other specified B group vitamins: Secondary | ICD-10-CM | POA: Diagnosis not present

## 2020-09-13 DIAGNOSIS — D696 Thrombocytopenia, unspecified: Secondary | ICD-10-CM

## 2020-09-13 DIAGNOSIS — Z7289 Other problems related to lifestyle: Secondary | ICD-10-CM

## 2020-09-13 LAB — T4, FREE: Free T4: 0.7 ng/dL (ref 0.61–1.12)

## 2020-09-13 NOTE — Progress Notes (Signed)
Patient denies new problems/concerns today.   °

## 2020-09-13 NOTE — Progress Notes (Signed)
  Hematology/Oncology Follow Up Note [facility]  [phone] [phone]  Patient Care Team: Dondiego, Richard, MD as PCP - General (Internal Medicine)   Name of the patient: Trevor Lambert  [id number]  10/28/1960   REASON FOR VISIT  follow-up for anemia and thrombocytopenia, vitamin B12 and folic acid deficiency.  INTERVAL HISTORY Trevor Lambert is a 60 y.o.[place] who has above oncology history reviewed by me today presented for follow up visit for management of microcytic anemia, bone lesion, alcohol use.  Patient reports feeling well today. He reports taking folic acid and vitamin B12 supplementation. He takes 1 beer per day. Denies any unintentional weight loss, fever, chills, fatigue, night sweats He has gained 6 pounds since last visit .  Review of Systems  Constitutional: Negative for appetite change, chills, fatigue, fever and unexpected weight change.  HENT:   Negative for hearing loss and voice change.   Eyes: Negative for eye problems and icterus.  Respiratory: Negative for chest tightness, cough and shortness of breath.   Cardiovascular: Negative for chest pain and leg swelling.  Gastrointestinal: Negative for abdominal distention and abdominal pain.  Endocrine: Negative for hot flashes.  Genitourinary: Negative for difficulty urinating, dysuria and frequency.   Musculoskeletal: Negative for arthralgias.  Skin: Negative for itching and rash.  Neurological: Negative for light-headedness and numbness.  Hematological: Negative for adenopathy. Does not bruise/bleed easily.  Psychiatric/Behavioral: Negative for confusion.      No Known Allergies   Past Medical History:  Diagnosis Date  . Seizures (HCC)   . Vitamin B12 deficiency 02/03/2019     Past Surgical History:  Procedure Laterality Date  . ESOPHAGOGASTRODUODENOSCOPY (EGD) WITH PROPOFOL N/A 11/07/2018   Procedure: ESOPHAGOGASTRODUODENOSCOPY (EGD) WITH PROPOFOL;   Surgeon: Anna, Kiran, MD;  Location: [facility] ENDOSCOPY;  Service: Gastroenterology;  Laterality: N/A;  . NO PAST SURGERIES      Social History   Socioeconomic History  . Marital status: Single    Spouse name: Not on file  . Number of children: Not on file  . Years of education: Not on file  . Highest education level: Not on file  Occupational History  . Not on file  Tobacco Use  . Smoking status: Current Every Day Smoker    Packs/day: 0.50    Years: 41.00    Pack years: 20.50    Types: Cigarettes  . Smokeless tobacco: Never Used  Vaping Use  . Vaping Use: Never used  Substance and Sexual Activity  . Alcohol use: Yes    Alcohol/week: 4.0 standard drinks    Types: 4 Cans of beer per week  . Drug use: Not Currently  . Sexual activity: Not on file  Other Topics Concern  . Not on file  Social History Narrative  . Not on file   Social Determinants of Health   Financial Resource Strain: Not on file  Food Insecurity: Not on file  Transportation Needs: Not on file  Physical Activity: Not on file  Stress: Not on file  Social Connections: Not on file  Intimate Partner Violence: Not on file    Family History  Problem Relation Age of Onset  . Cancer Neg Hx      Current Outpatient Medications:  .  albuterol (VENTOLIN HFA) 108 (90 Base) MCG/ACT inhaler, , Disp: , Rfl:  .  DILANTIN 100 MG ER capsule, Take 100 mg by mouth 3 (three) times daily., Disp: , Rfl:  .  folic acid (FOLVITE) 1 MG tablet, Take 1   tablet (1 mg total) by mouth daily., Disp: 30 tablet, Rfl: 2 .  levETIRAcetam (KEPPRA) 500 MG tablet, Take 500 mg by mouth daily. , Disp: , Rfl:  .  simvastatin (ZOCOR) 20 MG tablet, Take 20 mg by mouth daily at 6 PM. , Disp: , Rfl:  .  sulfacetamide (BLEPH-10) 10 % ophthalmic solution, SMARTSIG:In Eye(s), Disp: , Rfl:  .  vitamin B-12 (CYANOCOBALAMIN) 1000 MCG tablet, Take 1 tablet (1,000 mcg total) by mouth daily., Disp: 90 tablet, Rfl: 2  Physical exam: ECOG 1 Vitals:    09/13/20 1003  BP: 109/83  Pulse: 90  Resp: 16  Temp: 98 F (36.7 C)  TempSrc: Tympanic  SpO2: 100%  Weight: 117 lb 9.6 oz (53.3 kg)   Physical Exam Constitutional:      General: He is not in acute distress. HENT:     Head: Normocephalic and atraumatic.  Eyes:     General: No scleral icterus.    Pupils: Pupils are equal, round, and reactive to light.  Cardiovascular:     Rate and Rhythm: Normal rate and regular rhythm.     Heart sounds: Normal heart sounds.  Pulmonary:     Effort: Pulmonary effort is normal. No respiratory distress.     Breath sounds: No wheezing.  Abdominal:     General: Bowel sounds are normal. There is no distension.     Palpations: Abdomen is soft. There is no mass.     Tenderness: There is no abdominal tenderness.  Musculoskeletal:        General: No deformity. Normal range of motion.     Cervical back: Normal range of motion and neck supple.  Skin:    General: Skin is warm and dry.     Findings: No erythema or rash.  Neurological:     Mental Status: He is alert and oriented to person, place, and time. Mental status is at baseline.     Cranial Nerves: No cranial nerve deficit.     Coordination: Coordination normal.  Psychiatric:        Mood and Affect: Mood normal.        Behavior: Behavior normal.        Thought Content: Thought content normal.     CMP Latest Ref Rng & Units 09/11/2020  Glucose 70 - 99 mg/dL 83  BUN 6 - 20 mg/dL 5(L)  Creatinine 0.61 - 1.24 mg/dL 0.81  Sodium 135 - 145 mmol/L 138  Potassium 3.5 - 5.1 mmol/L 4.3  Chloride 98 - 111 mmol/L 99  CO2 22 - 32 mmol/L 28  Calcium 8.9 - 10.3 mg/dL 8.5(L)  Total Protein 6.5 - 8.1 g/dL 7.5  Total Bilirubin 0.3 - 1.2 mg/dL 0.5  Alkaline Phos 38 - 126 U/L 119  AST 15 - 41 U/L 53(H)  ALT 0 - 44 U/L 29   CBC Latest Ref Rng & Units 09/11/2020  WBC 4.0 - 10.5 K/uL 7.0  Hemoglobin 13.0 - 17.0 g/dL 10.9(L)  Hematocrit 39.0 - 52.0 % 33.4(L)  Platelets 150 - 400 K/uL 135(L)    No  results found.   Assessment and plan   1. Vitamin B12 deficiency   2. Folate deficiency   3. Thrombocytopenia ([place])   4. Alcohol use   5. Alcoholic cirrhosis of liver without ascites (HCC)    #Anemia, Increased to 10.9, etiology unknown.  Previous work-up was not remarkable Possible due to chronic alcohol use. Recommended to monitor for another 3 months.  If progressively decreases, recommend  bone marrow biopsy for further evaluation.  He agrees with the plan.  # Folate deficiency, continue Folvite 72m daily.  # History of B12 deficiency, continue oral B12  10060m daily.   #Chronic alcohol use, alcohol cessation was discussed with patient. Screening for H[place] 04/27/2019 right upper quadrant ultrasound negative.  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  Repeat MRI showed stable lesion. I will hold off additional image at this point.  Orders Placed This Encounter  Procedures  . CBC with Differential/Platelet    Standing Status:   Future    Standing Expiration Date:   09/13/2021  . Comprehensive metabolic panel    Standing Status:   Future    Standing Expiration Date:   09/13/2021  . Kappa/lambda light chains    Standing Status:   Future    Standing Expiration Date:   09/13/2021  . Multiple Myeloma Panel (SPEP&IFE w/QIG)    Standing Status:   Future    Standing Expiration Date:   09/13/2021  . Retic Panel    Standing Status:   Future    Standing Expiration Date:   09/13/2021  . Vitamin B12    Standing Status:   Future    Standing Expiration Date:   09/13/2021  . Folate    Standing Status:   Future    Standing Expiration Date:   09/13/2021  . Iron and TIBC    Standing Status:   Future    Standing Expiration Date:   09/13/2021  . Ferritin    Standing Status:   Future     Standing Expiration Date:   09/13/2021      Follow-up in 3 months   ZhEarlie ServerMD, PhD Hematology Oncology [facility] Al[facility] Hospital[facility]- [phone]/29/2022

## 2020-12-13 ENCOUNTER — Inpatient Hospital Stay: Payer: Medicare Other | Attending: Oncology

## 2020-12-13 ENCOUNTER — Encounter: Payer: Self-pay | Admitting: Oncology

## 2020-12-13 DIAGNOSIS — D649 Anemia, unspecified: Secondary | ICD-10-CM | POA: Diagnosis not present

## 2020-12-13 DIAGNOSIS — E538 Deficiency of other specified B group vitamins: Secondary | ICD-10-CM | POA: Diagnosis present

## 2020-12-13 DIAGNOSIS — D696 Thrombocytopenia, unspecified: Secondary | ICD-10-CM

## 2020-12-13 LAB — COMPREHENSIVE METABOLIC PANEL
ALT: 22 U/L (ref 0–44)
AST: 50 U/L — ABNORMAL HIGH (ref 15–41)
Albumin: 3.3 g/dL — ABNORMAL LOW (ref 3.5–5.0)
Alkaline Phosphatase: 116 U/L (ref 38–126)
Anion gap: 9 (ref 5–15)
BUN: <5 mg/dL — ABNORMAL LOW (ref 6–20)
CO2: 27 mmol/L (ref 22–32)
Calcium: 8.6 mg/dL — ABNORMAL LOW (ref 8.9–10.3)
Chloride: 100 mmol/L (ref 98–111)
Creatinine, Ser: 0.7 mg/dL (ref 0.61–1.24)
GFR, Estimated: >60 mL/min (ref >60)
Glucose, Bld: 81 mg/dL (ref 70–99)
Potassium: 4.2 mmol/L (ref 3.5–5.1)
Sodium: 136 mmol/L (ref 135–145)
Total Bilirubin: <0.1 mg/dL — ABNORMAL LOW (ref 0.3–1.2)
Total Protein: 7.6 g/dL (ref 6.5–8.1)

## 2020-12-13 LAB — CBC WITH DIFFERENTIAL/PLATELET
Abs Immature Granulocytes: 0.01 10*3/uL (ref 0.00–0.07)
Basophils Absolute: 0.1 10*3/uL (ref 0.0–0.1)
Basophils Relative: 1 %
Eosinophils Absolute: 0.1 10*3/uL (ref 0.0–0.5)
Eosinophils Relative: 2 %
HCT: 35.4 % — ABNORMAL LOW (ref 39.0–52.0)
Hemoglobin: 11.5 g/dL — ABNORMAL LOW (ref 13.0–17.0)
Immature Granulocytes: 0 %
Lymphocytes Relative: 29 %
Lymphs Abs: 1.4 10*3/uL (ref 0.7–4.0)
MCH: 27.6 pg (ref 26.0–34.0)
MCHC: 32.5 g/dL (ref 30.0–36.0)
MCV: 84.9 fL (ref 80.0–100.0)
Monocytes Absolute: 0.5 10*3/uL (ref 0.1–1.0)
Monocytes Relative: 9 %
Neutro Abs: 3 10*3/uL (ref 1.7–7.7)
Neutrophils Relative %: 59 %
Platelets: 205 10*3/uL (ref 150–400)
RBC: 4.17 MIL/uL — ABNORMAL LOW (ref 4.22–5.81)
RDW: 15.5 % (ref 11.5–15.5)
WBC: 5 10*3/uL (ref 4.0–10.5)
nRBC: 0 % (ref 0.0–0.2)

## 2020-12-13 LAB — FERRITIN: Ferritin: 86 ng/mL (ref 24–336)

## 2020-12-13 LAB — RETIC PANEL
Immature Retic Fract: 4.9 % (ref 2.3–15.9)
RBC.: 4.12 MIL/uL — ABNORMAL LOW (ref 4.22–5.81)
Retic Count, Absolute: 36.3 10*3/uL (ref 19.0–186.0)
Retic Ct Pct: 0.9 % (ref 0.4–3.1)
Reticulocyte Hemoglobin: 30.2 pg (ref >27.9)

## 2020-12-13 LAB — IRON AND TIBC
Iron: 141 ug/dL (ref 45–182)
Saturation Ratios: 63 % — ABNORMAL HIGH (ref 17.9–39.5)
TIBC: 224 ug/dL — ABNORMAL LOW (ref 250–450)
UIBC: 83 ug/dL

## 2020-12-13 LAB — VITAMIN B12: Vitamin B-12: 390 pg/mL (ref 180–914)

## 2020-12-13 LAB — FOLATE: Folate: 5.4 ng/mL — ABNORMAL LOW (ref >5.9)

## 2020-12-16 LAB — MULTIPLE MYELOMA PANEL, SERUM
Albumin SerPl Elph-Mcnc: 3.5 g/dL (ref 2.9–4.4)
Albumin/Glob SerPl: 1 (ref 0.7–1.7)
Alpha 1: 0.3 g/dL (ref 0.0–0.4)
Alpha2 Glob SerPl Elph-Mcnc: 0.7 g/dL (ref 0.4–1.0)
B-Globulin SerPl Elph-Mcnc: 1 g/dL (ref 0.7–1.3)
Gamma Glob SerPl Elph-Mcnc: 1.8 g/dL (ref 0.4–1.8)
Globulin, Total: 3.8 g/dL (ref 2.2–3.9)
IgA: 684 mg/dL — ABNORMAL HIGH (ref 90–386)
IgG (Immunoglobin G), Serum: 1685 mg/dL — ABNORMAL HIGH (ref 603–1613)
IgM (Immunoglobulin M), Srm: 122 mg/dL (ref 20–172)
Total Protein ELP: 7.3 g/dL (ref 6.0–8.5)

## 2020-12-16 LAB — KAPPA/LAMBDA LIGHT CHAINS
Kappa free light chain: 46.9 mg/L — ABNORMAL HIGH (ref 3.3–19.4)
Kappa, lambda light chain ratio: 0.89 (ref 0.26–1.65)
Lambda free light chains: 52.9 mg/L — ABNORMAL HIGH (ref 5.7–26.3)

## 2020-12-20 ENCOUNTER — Encounter: Payer: Self-pay | Admitting: Oncology

## 2020-12-20 ENCOUNTER — Inpatient Hospital Stay: Payer: Medicare Other | Attending: Oncology | Admitting: Oncology

## 2020-12-20 VITALS — BP 113/77 | HR 73 | Temp 96.5°F | Resp 16 | Wt 116.2 lb

## 2020-12-20 DIAGNOSIS — D649 Anemia, unspecified: Secondary | ICD-10-CM | POA: Diagnosis not present

## 2020-12-20 DIAGNOSIS — D696 Thrombocytopenia, unspecified: Secondary | ICD-10-CM | POA: Diagnosis not present

## 2020-12-20 DIAGNOSIS — R790 Abnormal level of blood mineral: Secondary | ICD-10-CM

## 2020-12-20 DIAGNOSIS — Z789 Other specified health status: Secondary | ICD-10-CM

## 2020-12-20 DIAGNOSIS — D509 Iron deficiency anemia, unspecified: Secondary | ICD-10-CM | POA: Diagnosis not present

## 2020-12-20 DIAGNOSIS — E538 Deficiency of other specified B group vitamins: Secondary | ICD-10-CM

## 2020-12-20 DIAGNOSIS — Z7289 Other problems related to lifestyle: Secondary | ICD-10-CM

## 2020-12-20 NOTE — Progress Notes (Signed)
Hematology/Oncology Follow Up Note [facility]  [phone] [phone]  Patient Care Team: Oval Linsey, MD as PCP - General (Internal Medicine)   Name of the patient: Trevor Lambert  [id number]  03-10-1961   REASON FOR VISIT  follow-up for anemia and thrombocytopenia, vitamin B12 and folic acid deficiency.  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  Repeat MRI showed stable lesion. I will hold off additional image at this point.  INTERVAL HISTORY Trevor Lambert is a 60 y.o.[place] who has above oncology history reviewed by me today presented for follow up visit for management of microcytic anemia,  Patient reports feeling well. He requests taking folic acid 1 mg daily and vitamin B12 supplementation daily Patient drinks 1 beer per day. Denies any unintentional weight loss, fever, chill, fatigue, night sweats .  Weight has been stable.   Review of Systems  Constitutional:  Negative for appetite change, chills, fatigue, fever and unexpected weight change.  HENT:   Negative for hearing loss and voice change.   Eyes:  Negative for eye problems and icterus.  Respiratory:  Negative for chest tightness, cough and shortness of breath.   Cardiovascular:  Negative for chest pain and leg swelling.  Gastrointestinal:  Negative for abdominal distention and abdominal pain.  Endocrine: Negative for hot flashes.  Genitourinary:  Negative for difficulty urinating, dysuria and frequency.   Musculoskeletal:  Negative for arthralgias.  Skin:  Negative for itching and rash.  Neurological:  Negative for light-headedness and numbness.  Hematological:  Negative for adenopathy. Does not bruise/bleed easily.  Psychiatric/Behavioral:  Negative for confusion.      No Known  Allergies   Past Medical History:  Diagnosis Date   Seizures (HCC)    Vitamin B12 deficiency 02/03/2019     Past Surgical History:  Procedure Laterality Date   ESOPHAGOGASTRODUODENOSCOPY (EGD) WITH PROPOFOL N/A 11/07/2018   Procedure: ESOPHAGOGASTRODUODENOSCOPY (EGD) WITH PROPOFOL;  Surgeon: Wyline Mood, MD;  Location: [facility] ENDOSCOPY;  Service: Gastroenterology;  Laterality: N/A;   NO PAST SURGERIES      Social History   Socioeconomic History   Marital status: Single    Spouse name: Not on file   Number of children: Not on file   Years of education: Not on file   Highest education level: Not on file  Occupational History   Not on file  Tobacco Use   Smoking status: Every Day    Packs/day: 0.50    Years: 41.00    Pack years: 20.50    Types: Cigarettes   Smokeless tobacco: Never  Vaping Use   Vaping Use: Never used  Substance and Sexual Activity   Alcohol use: Yes    Alcohol/week: 4.0 standard drinks    Types: 4 Cans of beer per week   Drug use: Not Currently   Sexual activity: Not on file  Other Topics Concern   Not on file  Social History Narrative   Not on file   Social Determinants of Health   Financial Resource Strain: Not on file  Food Insecurity: Not on file  Transportation Needs: Not on file  Physical Activity: Not on file  Stress: Not on file  Social Connections: Not on file  Intimate Partner Violence: Not on file    Family History  Problem Relation Age of Onset   Cancer Neg Hx  Current Outpatient Medications:    albuterol (VENTOLIN HFA) 108 (90 Base) MCG/ACT inhaler, , Disp: , Rfl:    DILANTIN 100 MG ER capsule, Take 100 mg by mouth 3 (three) times daily., Disp: , Rfl:    folic acid (FOLVITE) 1 MG tablet, Take 1 tablet (1 mg total) by mouth daily., Disp: 30 tablet, Rfl: 2   levETIRAcetam (KEPPRA) 500 MG tablet, Take 500 mg by mouth daily. , Disp: , Rfl:    simvastatin (ZOCOR) 20 MG tablet, Take 20 mg by mouth daily at 6 PM. , Disp: , Rfl:     vitamin B-12 (CYANOCOBALAMIN) 1000 MCG tablet, Take 1 tablet (1,000 mcg total) by mouth daily., Disp: 90 tablet, Rfl: 2   sulfacetamide (BLEPH-10) 10 % ophthalmic solution, SMARTSIG:In Eye(s) (Patient not taking: Reported on 12/20/2020), Disp: , Rfl:   Physical exam: ECOG 1 Vitals:   12/20/20 0952  BP: 113/77  Pulse: 73  Resp: 16  Temp: (!) 96.5 F (35.8 C)  Weight: 116 lb 3.2 oz (52.7 kg)   Physical Exam Constitutional:      General: He is not in acute distress. HENT:     Head: Normocephalic and atraumatic.  Eyes:     General: No scleral icterus.    Pupils: Pupils are equal, round, and reactive to light.  Cardiovascular:     Rate and Rhythm: Normal rate and regular rhythm.     Heart sounds: Normal heart sounds.  Pulmonary:     Effort: Pulmonary effort is normal. No respiratory distress.     Breath sounds: No wheezing.  Abdominal:     General: Bowel sounds are normal. There is no distension.     Palpations: Abdomen is soft. There is no mass.     Tenderness: There is no abdominal tenderness.  Musculoskeletal:        General: No deformity. Normal range of motion.     Cervical back: Normal range of motion and neck supple.  Skin:    General: Skin is warm and dry.     Findings: No erythema or rash.  Neurological:     Mental Status: He is alert and oriented to person, place, and time. Mental status is at baseline.     Cranial Nerves: No cranial nerve deficit.     Coordination: Coordination normal.  Psychiatric:        Mood and Affect: Mood normal.        Behavior: Behavior normal.        Thought Content: Thought content normal.    CMP Latest Ref Rng & Units 12/13/2020  Glucose 70 - 99 mg/dL 81  BUN 6 - 20 mg/dL <0(X)  Creatinine 3.23 - 1.24 mg/dL 5.57  Sodium 322 - 025 mmol/L 136  Potassium 3.5 - 5.1 mmol/L 4.2  Chloride 98 - 111 mmol/L 100  CO2 22 - 32 mmol/L 27  Calcium 8.9 - 10.3 mg/dL 4.2(H)  Total Protein 6.5 - 8.1 g/dL 7.6  Total Bilirubin 0.3 - 1.2 mg/dL  <0.6(C)  Alkaline Phos 38 - 126 U/L 116  AST 15 - 41 U/L 50(H)  ALT 0 - 44 U/L 22   CBC Latest Ref Rng & Units 12/13/2020  WBC 4.0 - 10.5 K/uL 5.0  Hemoglobin 13.0 - 17.0 g/dL 11.5(L)  Hematocrit 39.0 - 52.0 % 35.4(L)  Platelets 150 - 400 K/uL 205    No results found.   Assessment and plan   1. Thrombocytopenia (HCC)   2. Vitamin B12 deficiency   3. Anemia,  unspecified type   4. Alcohol use   5. Abnormal iron saturation    #Anemia is likely secondary to folate deficiency, chronic alcohol use Labs reviewed and discussed with patient Hemoglobin has improved to 11.5. Recommend alcohol cessation.  #Chronically increased iron saturation, likely due to alcohol use. Check hemochromatosis screening testing at the next visit . #History of vitamin B12 deficiency, continue B12 supplementation.  B12 has improved #Folate deficiency, patient has been on folic acid 1 mg daily.  Recommend patient to increase to 2 mg daily.  #Chronic alcohol use, alcohol cessation was discussed with patient.  #Thrombocytopenia, resolved.  Orders Placed This Encounter  Procedures   CBC with Differential/Platelet    Standing Status:   Future    Standing Expiration Date:   12/20/2021   Comprehensive metabolic panel    Standing Status:   Future    Standing Expiration Date:   12/20/2021   Hemochromatosis DNA-PCR(c282y,h63d)    Standing Status:   Future    Standing Expiration Date:   12/20/2021   Ferritin    Standing Status:   Future    Standing Expiration Date:   12/20/2021   Iron and TIBC    Standing Status:   Future    Standing Expiration Date:   12/20/2021   Vitamin B12    Standing Status:   Future    Standing Expiration Date:   12/20/2021   Folate    Standing Status:   Future    Standing Expiration Date:   12/20/2021   CBC with Differential/Platelet    Standing Status:   Future    Standing Expiration Date:   12/20/2021   Comprehensive metabolic panel    Standing Status:   Future    Standing Expiration  Date:   12/20/2021   Iron and TIBC    Standing Status:   Future    Standing Expiration Date:   12/20/2021   Ferritin    Standing Status:   Future    Standing Expiration Date:   12/20/2021   Vitamin B12    Standing Status:   Future    Standing Expiration Date:   12/20/2021   Folate    Standing Status:   Future    Standing Expiration Date:   12/20/2021      Labs in 4 months (cbc,cmp, hemochormatosis, iron,ferr, folate, b12) NP 1 week after labs  Labs in 8 months (cbc,cmp, iron,ferr, folat, b12)/ MD a few days after labs   Rickard Patience, MD, PhD Hematology Oncology [facility] Pager- [phone] 12/20/2020

## 2020-12-20 NOTE — Progress Notes (Signed)
Patient denies new problems/concerns today.   °

## 2021-02-19 ENCOUNTER — Other Ambulatory Visit: Payer: Self-pay | Admitting: Oncology

## 2021-04-21 ENCOUNTER — Telehealth: Payer: Self-pay

## 2021-04-21 ENCOUNTER — Other Ambulatory Visit: Payer: Self-pay

## 2021-04-21 ENCOUNTER — Inpatient Hospital Stay: Payer: Medicare Other | Attending: Nurse Practitioner

## 2021-04-21 DIAGNOSIS — E538 Deficiency of other specified B group vitamins: Secondary | ICD-10-CM

## 2021-04-21 DIAGNOSIS — D649 Anemia, unspecified: Secondary | ICD-10-CM | POA: Diagnosis present

## 2021-04-21 DIAGNOSIS — D696 Thrombocytopenia, unspecified: Secondary | ICD-10-CM

## 2021-04-21 LAB — CBC WITH DIFFERENTIAL/PLATELET
Abs Immature Granulocytes: 0.01 10*3/uL (ref 0.00–0.07)
Basophils Absolute: 0 10*3/uL (ref 0.0–0.1)
Basophils Relative: 1 %
Eosinophils Absolute: 0.1 10*3/uL (ref 0.0–0.5)
Eosinophils Relative: 2 %
HCT: 29.2 % — ABNORMAL LOW (ref 39.0–52.0)
Hemoglobin: 9.4 g/dL — ABNORMAL LOW (ref 13.0–17.0)
Immature Granulocytes: 0 %
Lymphocytes Relative: 32 %
Lymphs Abs: 1.1 10*3/uL (ref 0.7–4.0)
MCH: 27.2 pg (ref 26.0–34.0)
MCHC: 32.2 g/dL (ref 30.0–36.0)
MCV: 84.6 fL (ref 80.0–100.0)
Monocytes Absolute: 0.5 10*3/uL (ref 0.1–1.0)
Monocytes Relative: 13 %
Neutro Abs: 1.8 10*3/uL (ref 1.7–7.7)
Neutrophils Relative %: 52 %
Platelets: 142 10*3/uL — ABNORMAL LOW (ref 150–400)
RBC: 3.45 MIL/uL — ABNORMAL LOW (ref 4.22–5.81)
RDW: 17.3 % — ABNORMAL HIGH (ref 11.5–15.5)
WBC: 3.5 10*3/uL — ABNORMAL LOW (ref 4.0–10.5)
nRBC: 0 % (ref 0.0–0.2)

## 2021-04-21 LAB — COMPREHENSIVE METABOLIC PANEL
ALT: 18 U/L (ref 0–44)
AST: 33 U/L (ref 15–41)
Albumin: 2.7 g/dL — ABNORMAL LOW (ref 3.5–5.0)
Alkaline Phosphatase: 111 U/L (ref 38–126)
Anion gap: 11 (ref 5–15)
BUN: <5 mg/dL — ABNORMAL LOW (ref 6–20)
CO2: 25 mmol/L (ref 22–32)
Calcium: 8 mg/dL — ABNORMAL LOW (ref 8.9–10.3)
Chloride: 103 mmol/L (ref 98–111)
Creatinine, Ser: 0.66 mg/dL (ref 0.61–1.24)
GFR, Estimated: >60 mL/min (ref >60)
Glucose, Bld: 42 mg/dL — CL (ref 70–99)
Potassium: 3.7 mmol/L (ref 3.5–5.1)
Sodium: 139 mmol/L (ref 135–145)
Total Bilirubin: <0.1 mg/dL — ABNORMAL LOW (ref 0.3–1.2)
Total Protein: 6.6 g/dL (ref 6.5–8.1)

## 2021-04-21 LAB — FERRITIN: Ferritin: 75 ng/mL (ref 24–336)

## 2021-04-21 LAB — IRON AND TIBC
Iron: 97 ug/dL (ref 45–182)
Saturation Ratios: 65 % — ABNORMAL HIGH (ref 17.9–39.5)
TIBC: 150 ug/dL — ABNORMAL LOW (ref 250–450)
UIBC: 53 ug/dL

## 2021-04-21 LAB — FOLATE: Folate: 4.8 ng/mL — ABNORMAL LOW (ref >5.9)

## 2021-04-21 LAB — VITAMIN B12: Vitamin B-12: 415 pg/mL (ref 180–914)

## 2021-04-21 NOTE — Telephone Encounter (Signed)
Spoke to sister in law informed her of patient's low blood sugar. She who stated patient was pale this morning with a swollen face. She is not with him now but will get in touch with him to encourage Juice/sugar. He does not have a PCP to followup with. She is asking for a [facility] due to seizure risk but she has to pickup kids. I asked her to just call back as soon as possible when she speaks/sees patient for next steps. NP informed and aware.

## 2021-04-21 NOTE — Telephone Encounter (Signed)
As requested by MD, attempted to call regarding today's low glucose level however no answer. Left voicemail to return call to office.

## 2021-04-28 ENCOUNTER — Encounter: Payer: Self-pay | Admitting: Nurse Practitioner

## 2021-04-28 ENCOUNTER — Inpatient Hospital Stay (HOSPITAL_BASED_OUTPATIENT_CLINIC_OR_DEPARTMENT_OTHER): Payer: Medicare Other | Admitting: Nurse Practitioner

## 2021-04-28 ENCOUNTER — Other Ambulatory Visit: Payer: Self-pay

## 2021-04-28 VITALS — BP 101/75 | HR 113 | Temp 97.9°F | Ht 67.0 in | Wt 115.6 lb

## 2021-04-28 DIAGNOSIS — D649 Anemia, unspecified: Secondary | ICD-10-CM | POA: Diagnosis not present

## 2021-04-28 DIAGNOSIS — Z789 Other specified health status: Secondary | ICD-10-CM

## 2021-04-28 DIAGNOSIS — E538 Deficiency of other specified B group vitamins: Secondary | ICD-10-CM

## 2021-04-28 DIAGNOSIS — D696 Thrombocytopenia, unspecified: Secondary | ICD-10-CM | POA: Diagnosis not present

## 2021-04-28 DIAGNOSIS — R790 Abnormal level of blood mineral: Secondary | ICD-10-CM

## 2021-04-28 LAB — HEMOCHROMATOSIS DNA-PCR(C282Y,H63D)

## 2021-04-28 NOTE — Progress Notes (Signed)
Hematology/Oncology Follow Up Note [facility]  Telephone:([phone] [phone]  Patient Care Team: Lucia Gaskins, MD as PCP - General (Internal Medicine)   Name of the patient: Trevor Lambert  [id number]  [birth year]   REASON FOR VISIT  follow-up for anemia and thrombocytopenia, vitamin E33 and folic acid deficiency.  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  Repeat MRI showed stable lesion. I will hold off additional image at this point.  INTERVAL HISTORY Trevor Lambert is a 60 y.o. male who has above oncology history reviewed by me today presented for follow up visit for management of microcytic anemia. He continues folic acid and vitamin B12. Continues alcohol consumption. Denies unintentional weight loss, fevers, chills, night sweats. Weight is stable .    Review of Systems  Constitutional:  Negative for appetite change, chills, fatigue, fever and unexpected weight change.  HENT:   Negative for hearing loss and voice change.   Eyes:  Negative for eye problems and icterus.  Respiratory:  Negative for chest tightness, cough and shortness of breath.   Cardiovascular:  Negative for chest pain and leg swelling.  Gastrointestinal:  Negative for abdominal distention and abdominal pain.  Endocrine: Negative for hot flashes.  Genitourinary:  Negative for difficulty urinating, dysuria and frequency.   Musculoskeletal:  Negative for arthralgias.  Skin:  Negative for itching and rash.  Neurological:  Negative for light-headedness and numbness.  Hematological:  Negative for adenopathy. Does not bruise/bleed easily.  Psychiatric/Behavioral:  Negative for confusion.      No Known Allergies   Past Medical History:  Diagnosis Date   Seizures ([place])    Vitamin B12 deficiency  02/03/2019     Past Surgical History:  Procedure Laterality Date   ESOPHAGOGASTRODUODENOSCOPY (EGD) WITH PROPOFOL N/A 11/07/2018   Procedure: ESOPHAGOGASTRODUODENOSCOPY (EGD) WITH PROPOFOL;  Surgeon: Jonathon Bellows, MD;  Location: [facility] ENDOSCOPY;  Service: Gastroenterology;  Laterality: N/A;   NO PAST SURGERIES      Social History   Socioeconomic History   Marital status: Single    Spouse name: Not on file   Number of children: Not on file   Years of education: Not on file   Highest education level: Not on file  Occupational History   Not on file  Tobacco Use   Smoking status: Every Day    Packs/day: 0.50    Years: 41.00    Pack years: 20.50    Types: Cigarettes   Smokeless tobacco: Never  Vaping Use   Vaping Use: Never used  Substance and Sexual Activity   Alcohol use: Yes    Alcohol/week: 4.0 standard drinks    Types: 4 Cans of beer per week   Drug use: Not Currently   Sexual activity: Not on file  Other Topics Concern   Not on file  Social History Narrative   Not on file   Social Determinants of Health   Financial Resource Strain: Not on file  Food Insecurity: Not on file  Transportation Needs: Not on file  Physical Activity: Not on file  Stress: Not on file  Social Connections: Not on file  Intimate Partner Violence: Not on file    Family History  Problem Relation Age of Onset   Cancer Neg Hx     Current Outpatient Medications:    albuterol (VENTOLIN HFA) 108 (90 Base) MCG/ACT inhaler, ,  Disp: , Rfl:    DILANTIN 100 MG ER capsule, Take 100 mg by mouth 3 (three) times daily., Disp: , Rfl:    folic acid (FOLVITE) 1 MG tablet, TAKE 1 TABLET BY MOUTH EVERY DAY, Disp: 90 tablet, Rfl: 1   levETIRAcetam (KEPPRA) 500 MG tablet, Take 500 mg by mouth daily. , Disp: , Rfl:    simvastatin (ZOCOR) 20 MG tablet, Take 20 mg by mouth daily at 6 PM. , Disp: , Rfl:    vitamin B-12 (CYANOCOBALAMIN) 1000 MCG tablet, Take 1 tablet (1,000 mcg total) by mouth daily., Disp: 90  tablet, Rfl: 2  Physical exam: ECOG 1 Vitals:   04/28/21 0943 04/28/21 0946  BP:  101/75  Pulse:  (!) 113  Temp:  97.9 F (36.6 C)  TempSrc:  Tympanic  Weight: 115 lb 9.6 oz (52.4 kg) 115 lb 9.6 oz (52.4 kg)  Height:  _0  (1.702 m)   Physical Exam Constitutional:      General: He is not in acute distress. HENT:     Head: Normocephalic and atraumatic.  Eyes:     General: No scleral icterus.    Pupils: Pupils are equal, round, and reactive to light.  Cardiovascular:     Rate and Rhythm: Normal rate and regular rhythm.     Heart sounds: Normal heart sounds.  Pulmonary:     Effort: Pulmonary effort is normal. No respiratory distress.     Breath sounds: No wheezing.  Abdominal:     General: Bowel sounds are normal. There is no distension.     Palpations: Abdomen is soft. There is no mass.     Tenderness: There is no abdominal tenderness.  Musculoskeletal:        General: No deformity. Normal range of motion.     Cervical back: Normal range of motion and neck supple.  Skin:    General: Skin is warm and dry.     Findings: No erythema or rash.  Neurological:     Mental Status: He is alert and oriented to person, place, and time. Mental status is at baseline.     Cranial Nerves: No cranial nerve deficit.     Coordination: Coordination normal.  Psychiatric:        Mood and Affect: Mood normal.        Behavior: Behavior normal.        Thought Content: Thought content normal.    CMP Latest Ref Rng & Units 04/21/2021  Glucose 70 - 99 mg/dL 42(LL)  BUN 6 - 20 mg/dL <5(L)  Creatinine 0.61 - 1.24 mg/dL 0.66  Sodium 135 - 145 mmol/L 139  Potassium 3.5 - 5.1 mmol/L 3.7  Chloride 98 - 111 mmol/L 103  CO2 22 - 32 mmol/L 25  Calcium 8.9 - 10.3 mg/dL 8.0(L)  Total Protein 6.5 - 8.1 g/dL 6.6  Total Bilirubin 0.3 - 1.2 mg/dL <0.1(L)  Alkaline Phos 38 - 126 U/L 111  AST 15 - 41 U/L 33  ALT 0 - 44 U/L 18   CBC Latest Ref Rng & Units 04/21/2021  WBC 4.0 - 10.5 K/uL 3.5(L)   Hemoglobin 13.0 - 17.0 g/dL 9.4(L)  Hematocrit 39.0 - 52.0 % 29.2(L)  Platelets 150 - 400 K/uL 142(L)   Iron/TIBC/Ferritin/ %Sat    Component Value Date/Time   IRON 97 04/21/2021 1056   IRON 41 08/17/2018 1334   TIBC 150 (L) 04/21/2021 1056   TIBC 65 (LL) 08/17/2018 1334   FERRITIN 75 04/21/2021 1056  FERRITIN 395 08/17/2018 1334   IRONPCTSAT 65 (H) 04/21/2021 1056   IRONPCTSAT 63 (H) 08/17/2018 1334     No results found.   Assessment and plan   No diagnosis found.  Anemia- multi-factorial (see below). Hemoglobin down to 9.4. Previously 11.5. Normocytic. Possibly related to chronic alcohol use and folate deficiency. If counts continue to drop, consider bone marrow biopsy.  B12 deficiency- improved. Goal > 500. Continue b12 supplementation.  Folate deficiency- 4.8 today. Low. Increase to 3 mg daily.  Elevated iron saturation- ferritin is normal at 75. Iron saturation 65%. TIBC decreased. Hemochromatosis DNA by PCR was negative. Likely related to alcohol use. Monitor.  Thrombocytopenia- stable but decreased. Monitor.  Alcohol use- encouraged cessation  Follow up with Dr. Tasia Catchings as scheduled  No orders of the defined types were placed in this encounter.  Beckey Rutter, DNP, AGNP-C [place] [facility] [phone] (clinic) 04/28/2021

## 2021-04-28 NOTE — Patient Instructions (Signed)
Please start daily folate acid supplement- la

## 2021-06-11 ENCOUNTER — Encounter: Payer: Self-pay | Admitting: Oncology

## 2021-07-17 ENCOUNTER — Other Ambulatory Visit: Payer: Self-pay | Admitting: *Deleted

## 2021-07-17 DIAGNOSIS — D649 Anemia, unspecified: Secondary | ICD-10-CM

## 2021-07-24 ENCOUNTER — Inpatient Hospital Stay: Payer: Medicare Other | Attending: Nurse Practitioner

## 2021-07-24 ENCOUNTER — Other Ambulatory Visit: Payer: Self-pay

## 2021-07-24 DIAGNOSIS — D696 Thrombocytopenia, unspecified: Secondary | ICD-10-CM | POA: Insufficient documentation

## 2021-07-24 DIAGNOSIS — I959 Hypotension, unspecified: Secondary | ICD-10-CM | POA: Insufficient documentation

## 2021-07-24 DIAGNOSIS — D509 Iron deficiency anemia, unspecified: Secondary | ICD-10-CM | POA: Diagnosis present

## 2021-07-24 DIAGNOSIS — D649 Anemia, unspecified: Secondary | ICD-10-CM

## 2021-07-24 DIAGNOSIS — E538 Deficiency of other specified B group vitamins: Secondary | ICD-10-CM | POA: Insufficient documentation

## 2021-07-24 DIAGNOSIS — R946 Abnormal results of thyroid function studies: Secondary | ICD-10-CM | POA: Diagnosis not present

## 2021-07-24 DIAGNOSIS — K7460 Unspecified cirrhosis of liver: Secondary | ICD-10-CM | POA: Diagnosis not present

## 2021-07-24 LAB — COMPREHENSIVE METABOLIC PANEL
ALT: 21 U/L (ref 0–44)
AST: 31 U/L (ref 15–41)
Albumin: 2.9 g/dL — ABNORMAL LOW (ref 3.5–5.0)
Alkaline Phosphatase: 162 U/L — ABNORMAL HIGH (ref 38–126)
Anion gap: 11 (ref 5–15)
BUN: <5 mg/dL — ABNORMAL LOW (ref 6–20)
CO2: 23 mmol/L (ref 22–32)
Calcium: 8.1 mg/dL — ABNORMAL LOW (ref 8.9–10.3)
Chloride: 102 mmol/L (ref 98–111)
Creatinine, Ser: 0.72 mg/dL (ref 0.61–1.24)
GFR, Estimated: >60 mL/min (ref >60)
Glucose, Bld: 82 mg/dL (ref 70–99)
Potassium: 4.1 mmol/L (ref 3.5–5.1)
Sodium: 136 mmol/L (ref 135–145)
Total Bilirubin: 0.9 mg/dL (ref 0.3–1.2)
Total Protein: 7.1 g/dL (ref 6.5–8.1)

## 2021-07-24 LAB — CBC WITH DIFFERENTIAL/PLATELET
Abs Immature Granulocytes: 0.02 10*3/uL (ref 0.00–0.07)
Basophils Absolute: 0 10*3/uL (ref 0.0–0.1)
Basophils Relative: 1 %
Eosinophils Absolute: 0.1 10*3/uL (ref 0.0–0.5)
Eosinophils Relative: 2 %
HCT: 36.5 % — ABNORMAL LOW (ref 39.0–52.0)
Hemoglobin: 11.8 g/dL — ABNORMAL LOW (ref 13.0–17.0)
Immature Granulocytes: 0 %
Lymphocytes Relative: 23 %
Lymphs Abs: 1.3 10*3/uL (ref 0.7–4.0)
MCH: 28.1 pg (ref 26.0–34.0)
MCHC: 32.3 g/dL (ref 30.0–36.0)
MCV: 86.9 fL (ref 80.0–100.0)
Monocytes Absolute: 0.6 10*3/uL (ref 0.1–1.0)
Monocytes Relative: 11 %
Neutro Abs: 3.5 10*3/uL (ref 1.7–7.7)
Neutrophils Relative %: 63 %
Platelets: 172 10*3/uL (ref 150–400)
RBC: 4.2 MIL/uL — ABNORMAL LOW (ref 4.22–5.81)
RDW: 16.1 % — ABNORMAL HIGH (ref 11.5–15.5)
WBC: 5.5 10*3/uL (ref 4.0–10.5)
nRBC: 0 % (ref 0.0–0.2)

## 2021-07-24 LAB — IRON AND TIBC
Iron: 217 ug/dL — ABNORMAL HIGH (ref 45–182)
Saturation Ratios: 92 % — ABNORMAL HIGH (ref 17.9–39.5)
TIBC: 235 ug/dL — ABNORMAL LOW (ref 250–450)
UIBC: 18 ug/dL

## 2021-07-24 LAB — FOLATE: Folate: 7.4 ng/mL

## 2021-07-24 LAB — FERRITIN: Ferritin: 47 ng/mL (ref 24–336)

## 2021-07-24 LAB — VITAMIN B12: Vitamin B-12: 238 pg/mL (ref 180–914)

## 2021-07-31 ENCOUNTER — Inpatient Hospital Stay: Payer: Medicare Other

## 2021-07-31 ENCOUNTER — Ambulatory Visit: Payer: Medicare Other | Admitting: Oncology

## 2021-07-31 ENCOUNTER — Encounter: Payer: Self-pay | Admitting: Nurse Practitioner

## 2021-07-31 ENCOUNTER — Inpatient Hospital Stay (HOSPITAL_BASED_OUTPATIENT_CLINIC_OR_DEPARTMENT_OTHER): Payer: Medicare Other | Admitting: Nurse Practitioner

## 2021-07-31 ENCOUNTER — Other Ambulatory Visit: Payer: Self-pay

## 2021-07-31 VITALS — BP 96/67 | HR 91 | Temp 96.9°F | Wt 117.0 lb

## 2021-07-31 DIAGNOSIS — D649 Anemia, unspecified: Secondary | ICD-10-CM | POA: Diagnosis not present

## 2021-07-31 DIAGNOSIS — D509 Iron deficiency anemia, unspecified: Secondary | ICD-10-CM | POA: Diagnosis not present

## 2021-07-31 DIAGNOSIS — R569 Unspecified convulsions: Secondary | ICD-10-CM

## 2021-07-31 DIAGNOSIS — E538 Deficiency of other specified B group vitamins: Secondary | ICD-10-CM

## 2021-07-31 DIAGNOSIS — R790 Abnormal level of blood mineral: Secondary | ICD-10-CM | POA: Diagnosis not present

## 2021-07-31 MED ORDER — SODIUM CHLORIDE 0.9 % IV SOLN
Freq: Once | INTRAVENOUS | Status: AC
  Filled 2021-07-31: qty 250

## 2021-07-31 MED ORDER — CYANOCOBALAMIN 1000 MCG/ML IJ SOLN
1000.0000 ug | Freq: Once | INTRAMUSCULAR | Status: AC
  Administered 2021-07-31: 1000 ug via INTRAMUSCULAR
  Filled 2021-07-31: qty 1

## 2021-07-31 NOTE — Progress Notes (Signed)
?Hematology/Oncology Follow Up Note ?[place]  ?[phone] [phone] ? ?Patient Care Team: ?Lucia Gaskins, MD (Inactive) as PCP - General (Internal Medicine)  ? ?Name of the patient: Trevor Lambert  ?[id number]  ?12/28/1960  ? ?REASON FOR VISIT ? follow-up for anemia and thrombocytopenia, vitamin L39 and folic acid deficiency. ? ?PERTINENT HEMATOLOGY HISTORY ? 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  Repeat MRI showed stable lesion. ?I will hold off additional image at this point. ? ?INTERVAL HISTORY ?Trevor Lambert is a 61 y.o. male with above history of microcytic anemia who returns to clinic for further evaluation and follow-up.  He continues folic acid and Q30 supplementation.  Continues alcohol consumption but has cut down to 1 beer/day. No longer seeing pcp d/t distance (was in [place]). He uses bus system and family members for transportation. Requests referral to new pcp close to home. Reports fall and seizure x 2 at home earlier this week. Denies injury. Has returned to baseline. Was previously taking dilantin and keppra but inconsistently. Last refill was in 2020 and he has pills remaining. He denies unintentional weight loss, fever, chills, night sweats.  Weight is stable. ? ? ?Review of Systems  ?Constitutional:  Positive for fatigue. Negative for appetite change, chills, fever and unexpected weight change.  ?HENT:   Negative for hearing loss and voice change.   ?Eyes:  Negative for eye problems and icterus.  ?Respiratory:  Negative for chest tightness, cough and shortness of breath.   ?Cardiovascular:  Negative for chest pain and leg swelling.  ?Gastrointestinal:  Negative for abdominal distention, abdominal pain, blood in stool, constipation, diarrhea and nausea.  ?Endocrine: Negative for hot flashes.   ?Genitourinary:  Negative for difficulty urinating, dysuria and frequency.   ?Musculoskeletal:  Negative for arthralgias, back pain and myalgias.  ?Skin:  Negative for itching and rash.  ?Neurological:  Positive for seizures. Negative for dizziness, light-headedness and numbness.  ?Hematological:  Negative for adenopathy. Does not bruise/bleed easily.  ?Psychiatric/Behavioral:  Negative for confusion and depression. The patient is not nervous/anxious.    ? ? ?No Known Allergies ? ? ?Past Medical History:  ?Diagnosis Date  ? Seizures ([place])   ? Vitamin B12 deficiency 02/03/2019  ? ? ? ?Past Surgical History:  ?Procedure Laterality Date  ? ESOPHAGOGASTRODUODENOSCOPY (EGD) WITH PROPOFOL N/A 11/07/2018  ? Procedure: ESOPHAGOGASTRODUODENOSCOPY (EGD) WITH PROPOFOL;  Surgeon: Jonathon Bellows, MD;  Location: [facility] ENDOSCOPY;  Service: Gastroenterology;  Laterality: N/A;  ? NO PAST SURGERIES    ? ? ?Social History  ? ?Socioeconomic History  ? Marital status: Single  ?  Spouse name: Not on file  ? Number of children: Not on file  ? Years of education: Not on file  ? Highest education level: Not on file  ?Occupational History  ? Not on file  ?Tobacco Use  ? Smoking status: Every Day  ?  Packs/day: 0.50  ?  Years: 41.00  ?  Pack years: 20.50  ?  Types: Cigarettes  ? Smokeless tobacco: Never  ?Vaping Use  ? Vaping Use: Never used  ?Substance and Sexual Activity  ? Alcohol use: Yes  ?  Alcohol/week: 4.0 standard drinks  ?  Types: 4 Cans of beer per week  ? Drug use: Not Currently  ? Sexual activity: Not on file  ?Other Topics Concern  ? Not on file  ?Social History Narrative  ?  Not on file  ? ?Social Determinants of Health  ? ?Financial Resource Strain: Not on file  ?Food Insecurity: Not on file  ?Transportation Needs: Not on file  ?Physical Activity: Not on file  ?Stress: Not on file  ?Social Connections: Not on file  ?Intimate Partner Violence: Not on file  ? ? ?Family History  ?Problem Relation Age of Onset  ? Cancer Neg Hx    ? ? ?Current Outpatient Medications:  ?  albuterol (VENTOLIN HFA) 108 (90 Base) MCG/ACT inhaler, , Disp: , Rfl:  ?  DILANTIN 100 MG ER capsule, Take 100 mg by mouth 3 (three) times daily., Disp: , Rfl:  ?  folic acid (FOLVITE) 1 MG tablet, TAKE 1 TABLET BY MOUTH EVERY DAY, Disp: 90 tablet, Rfl: 1 ?  levETIRAcetam (KEPPRA) 500 MG tablet, Take 500 mg by mouth daily. , Disp: , Rfl:  ?  simvastatin (ZOCOR) 20 MG tablet, Take 20 mg by mouth daily at 6 PM. , Disp: , Rfl:  ?  vitamin B-12 (CYANOCOBALAMIN) 1000 MCG tablet, Take 1 tablet (1,000 mcg total) by mouth daily., Disp: 90 tablet, Rfl: 2 ? ?Physical exam: ECOG 1 ?Vitals:  ? 07/31/21 1023  ?BP: 96/67  ?Pulse: 91  ?Temp: (!) 96.9 ?F (36.1 ?C)  ?TempSrc: Tympanic  ?Weight: 117 lb (53.1 kg)  ? ?Physical Exam ?Vitals reviewed.  ?Constitutional:   ?   Appearance: He is not ill-appearing.  ?   Comments: Thin build  ?Cardiovascular:  ?   Rate and Rhythm: Normal rate and regular rhythm.  ?Pulmonary:  ?   Effort: No respiratory distress.  ?Abdominal:  ?   Tenderness: There is no abdominal tenderness.  ?Musculoskeletal:  ?   Right lower leg: No edema.  ?   Left lower leg: No edema.  ?Skin: ?   Coloration: Skin is not pale.  ?Neurological:  ?   Mental Status: He is alert and oriented to person, place, and time.  ?Psychiatric:     ?   Mood and Affect: Mood normal.     ?   Behavior: Behavior normal.  ? ? ?CMP Latest Ref Rng & Units 07/24/2021  ?Glucose 70 - 99 mg/dL 82  ?BUN 6 - 20 mg/dL <5(L)  ?Creatinine 0.61 - 1.24 mg/dL 0.72  ?Sodium 135 - 145 mmol/L 136  ?Potassium 3.5 - 5.1 mmol/L 4.1  ?Chloride 98 - 111 mmol/L 102  ?CO2 22 - 32 mmol/L 23  ?Calcium 8.9 - 10.3 mg/dL 8.1(L)  ?Total Protein 6.5 - 8.1 g/dL 7.1  ?Total Bilirubin 0.3 - 1.2 mg/dL 0.9  ?Alkaline Phos 38 - 126 U/L 162(H)  ?AST 15 - 41 U/L 31  ?ALT 0 - 44 U/L 21  ? ?CBC Latest Ref Rng & Units 07/24/2021  ?WBC 4.0 - 10.5 K/uL 5.5  ?Hemoglobin 13.0 - 17.0 g/dL 11.8(L)  ?Hematocrit 39.0 - 52.0 % 36.5(L)  ?Platelets 150 -  400 K/uL 172  ? ?Iron/TIBC/Ferritin/ %Sat ?   ?Component Value Date/Time  ? IRON 217 (H) 07/24/2021 1040  ? IRON 41 08/17/2018 1334  ? TIBC 235 (L) 07/24/2021 1040  ? TIBC 65 (LL) 08/17/2018 1334  ? FERRITIN 47 07/24/2021 1040  ? FERRITIN 395 08/17/2018 1334  ? IRONPCTSAT 92 (H) 07/24/2021 1040  ? IRONPCTSAT 63 (H) 08/17/2018 1334  ? ?No results found. ? ? ?Assessment and Plan:  ?  ?No diagnosis found. ? ?Anemia- multi-factorial (see below).  Hemoglobin has improved to 11.8 which is consistent with his baseline.  Previously 9.4.  Normocytic.  Possibly related to chronic alcohol use and chronic folate deficiency.  If his counts downtrend, consider bone marrow biopsy. ?B12 deficiency-chronically low.  Most recently 70.  Goal > 500. Plan to restart b12 injections. Proceed with b12 monthly.  ?Folate deficiency-7.8.  Improved.  Continue supplementation at 3 mg daily. ?Elevated iron saturation-ferritin remains normal at 47.  Iron saturation elevated at 92%. TIBC is decreased.  Hemochromatosis DNA by PCR was negative.  Suspect secondary to chronic alcohol use/liver disease given normal ferritin.  Monitor. ?Thrombocytopenia-platelet 172.  Improved.  Monitor. ?Alcohol use- improved. Down to 1 drink/day. Applauded his continued efforts to become alcohol free. ?Seizure Disorder- patient reports recent seizures x 2. Unwitnessed at home. Did not seek medical evaluation. Reviewed that he needs to seek emergency medical attention for such events. PCP/Dr. Cindie Laroche has been prescribing in the past but patient is no longer seeing pcp. Reports noncompliance with medication. On dilantin and keppra. Last fill was 2020. Recommended neurology evaluation. Will also provide with information regarding how to establish care with pcp.  ?Hypotension- IV fluids today.  ?Cirrhosis- patient has not had liver imaging since 2020. Consider referral back to GI to follow him.  ?Elevated TSH- previously reported an elevated TSH, 6.370. Recommend he  have evaluated with pcp.  ? ?At patient's request I spoke to his sister in law, Freda Munro. Reviewed need for compliance with seizure medications and emergency care for seizures. I've recommended he re-start medication tha

## 2021-07-31 NOTE — Progress Notes (Signed)
Patient here for follow up. Patient states he had 2 seizures on Tuesday.  ?

## 2021-07-31 NOTE — Patient Instructions (Signed)
Please call the [facility] at [phone] to establish care with a Primary Care Provider.   In the meantime, you can access care through the following free and reduced cost health care locations in [place]:   -[facility] ([address], [place], [place] [address]- [phone])  -[facility] ([address], [address], [place] [address]- [phone])  -[facility] ([address], [place], [place] [address]- [phone])  -[facility] ([address], [place]- [phone])  -[facility]- - [phone])  

## 2021-07-31 NOTE — Progress Notes (Signed)
Received 1 L NS and a B12 IM injection. Discharged to home. Feeling well. ?

## 2021-08-04 ENCOUNTER — Encounter: Payer: Self-pay | Admitting: Oncology

## 2021-08-19 ENCOUNTER — Other Ambulatory Visit: Payer: Medicare Other

## 2021-08-22 ENCOUNTER — Ambulatory Visit: Payer: Medicare Other | Admitting: Oncology

## 2021-08-26 ENCOUNTER — Ambulatory Visit (INDEPENDENT_AMBULATORY_CARE_PROVIDER_SITE_OTHER): Payer: Medicare Other | Admitting: Neurology

## 2021-08-26 ENCOUNTER — Encounter: Payer: Self-pay | Admitting: Neurology

## 2021-08-26 VITALS — BP 109/82 | HR 80 | Ht 67.0 in | Wt 115.0 lb

## 2021-08-26 DIAGNOSIS — G40909 Epilepsy, unspecified, not intractable, without status epilepticus: Secondary | ICD-10-CM

## 2021-08-26 DIAGNOSIS — Z789 Other specified health status: Secondary | ICD-10-CM | POA: Diagnosis not present

## 2021-08-26 DIAGNOSIS — Z5181 Encounter for therapeutic drug level monitoring: Secondary | ICD-10-CM

## 2021-08-26 DIAGNOSIS — E559 Vitamin D deficiency, unspecified: Secondary | ICD-10-CM

## 2021-08-26 MED ORDER — PHENYTOIN SODIUM EXTENDED 100 MG PO CAPS: 300.0000 mg | ORAL_CAPSULE | Freq: Every day | ORAL | 3 refills | Status: DC

## 2021-08-26 MED ORDER — LEVETIRACETAM ER 500 MG PO TB24: 1000.0000 mg | ORAL_TABLET | Freq: Every day | ORAL | 3 refills | Status: DC

## 2021-08-26 NOTE — Patient Instructions (Addendum)
Continue with Dilantin 300 mg daily  ?Continue with Keppra 1000 mg daily  ?Will check ASM levels today ?Alcohol cessation  ?Follow up in 3 months or sooner if worse  ?

## 2021-08-26 NOTE — Progress Notes (Signed)
? ?[facility] ? ?PATIENT: Trevor Lambert ?DOB: [birth year] ? ?REQUESTING CLINICIAN: Verlon Au, NP ?HISTORY FROM: Patient and mother  ?REASON FOR VISIT: Establish care for epilepsy  ? ? ?HISTORICAL ? ?CHIEF COMPLAINT:  ?Chief Complaint  ?Patient presents with  ? New Patient (Initial Visit)  ?  Room 13, with mother  ?NP internal referral for seizures/sched w pt's sister in law over the phone. ?Reports last sz 2 weeks ago, no new sx today   ? ? ?HISTORY OF PRESENT ILLNESS:  ?This is a 61 year old man with PMHx of seizure disorder since the age of 26, alcohol abuse who is presenting to establish care. Patient reports history of seizure and medications non adherence, currently is on Phenytoin 300 mg and Keppra 500 mg TID but but again he has been taking in inconsistently. Last seizure was reported 2 weeks ago, when he believe that he had back to back seizures. Patient reports that he feel in the bathroom and has to crawl back to bed. Denies any injuries. He denies any seizure risk factor other than grandmother had seizures. He described his seizures as grand mal seizure, generalized convulsion.  ?He does also report long history of alcohol abuse. Reports drinking 3 to 4 days out of the week, 2 40 oz of beer. Denies any alcohol withdrawal seizures.  ?Currently patient is not driving.  ? ?Handedness: Right hand ? ?Onset:Since the age of 49  ? ?Seizure Type: Generalized  ? ?Current frequency: Unclear, patient reports last seizure was 2 weeks ago  ? ?Any injuries from seizures: None  ? ?Seizure risk factors: Grandmother  ? ?Previous ASMs: Phenytoin, Levetiracetam  ? ?Currenty ASMs: Phenytoin 300 mg daily  ? ?ASMs side effects: Denies  ? ?Brain Images: Small vessel disease, no acute intracranial abnormality.  ? ?Previous EEGs: None available for review  ? ? ?OTHER MEDICAL CONDITIONS: Seizure, alcohol abuse, hyperlipidemia  ? ?REVIEW OF SYSTEMS: Full 14 system review of systems performed and negative  with exception of: as noted in the HPI  ? ?ALLERGIES: ?No Known Allergies ? ?HOME MEDICATIONS: ?Outpatient Medications Prior to Visit  ?Medication Sig Dispense Refill  ? albuterol (VENTOLIN HFA) 108 (90 Base) MCG/ACT inhaler     ? folic acid (FOLVITE) 1 MG tablet TAKE 1 TABLET BY MOUTH EVERY DAY 90 tablet 1  ? simvastatin (ZOCOR) 20 MG tablet Take 20 mg by mouth daily at 6 PM.     ? vitamin B-12 (CYANOCOBALAMIN) 1000 MCG tablet Take 1 tablet (1,000 mcg total) by mouth daily. 90 tablet 2  ? DILANTIN 100 MG ER capsule Take 100 mg by mouth 3 (three) times daily.    ? levETIRAcetam (KEPPRA) 500 MG tablet Take 500 mg by mouth daily.     ? ?No facility-administered medications prior to visit.  ? ? ?PAST MEDICAL HISTORY: ?Past Medical History:  ?Diagnosis Date  ? Seizures ([place])   ? Vitamin B12 deficiency 02/03/2019  ? ? ?PAST SURGICAL HISTORY: ?Past Surgical History:  ?Procedure Laterality Date  ? ESOPHAGOGASTRODUODENOSCOPY (EGD) WITH PROPOFOL N/A 11/07/2018  ? Procedure: ESOPHAGOGASTRODUODENOSCOPY (EGD) WITH PROPOFOL;  Surgeon: Jonathon Bellows, MD;  Location: [facility] ENDOSCOPY;  Service: Gastroenterology;  Laterality: N/A;  ? NO PAST SURGERIES    ? ? ?FAMILY HISTORY: ?Family History  ?Problem Relation Age of Onset  ? Cancer Neg Hx   ? ? ?SOCIAL HISTORY: ?Social History  ? ?Socioeconomic History  ? Marital status: Single  ?  Spouse name: Not on file  ? Number  of children: Not on file  ? Years of education: Not on file  ? Highest education level: Not on file  ?Occupational History  ? Not on file  ?Tobacco Use  ? Smoking status: Every Day  ?  Packs/day: 0.50  ?  Years: 41.00  ?  Pack years: 20.50  ?  Types: Cigarettes  ? Smokeless tobacco: Never  ?Vaping Use  ? Vaping Use: Never used  ?Substance and Sexual Activity  ? Alcohol use: Yes  ?  Alcohol/week: 4.0 standard drinks  ?  Types: 4 Cans of beer per week  ? Drug use: Not Currently  ? Sexual activity: Not on file  ?Other Topics Concern  ? Not on file  ?Social History Narrative  ?  Not on file  ? ?Social Determinants of Health  ? ?Financial Resource Strain: Not on file  ?Food Insecurity: Not on file  ?Transportation Needs: Not on file  ?Physical Activity: Not on file  ?Stress: Not on file  ?Social Connections: Not on file  ?Intimate Partner Violence: Not on file  ? ? ?PHYSICAL EXAM ? ?GENERAL EXAM/CONSTITUTIONAL: ?Vitals:  ?Vitals:  ? 08/26/21 0858  ?BP: 109/82  ?Pulse: 80  ?Weight: 115 lb (52.2 kg)  ?Height: '5\' 7"'  (1.702 m)  ? ?Body mass index is 18.01 kg/m?. ?Wt Readings from Last 3 Encounters:  ?08/26/21 115 lb (52.2 kg)  ?07/31/21 117 lb (53.1 kg)  ?04/28/21 115 lb 9.6 oz (52.4 kg)  ? ?Patient is in no distress; well developed, nourished and groomed; neck is supple ? ?EYES: ?Pupils round and reactive to light, Visual fields full to confrontation, Extraocular movements intacts,  ?No results found. ? ?MUSCULOSKELETAL: ?Gait, strength, tone, movements noted in Neurologic exam below ? ?NEUROLOGIC: ?MENTAL STATUS:  ?   ? View : No data to display.  ?  ?  ?  ? ?awake, alert, oriented to person, place, date and month but not year. Could not tell me the current [place] president  ?Unable to spell PENCIL (stop school at 10 grade), unable to calculate the number of quarter in $1,75 ?language fluent, comprehension intact, naming intact ? ?CRANIAL NERVE:  ?2nd, 3rd, 4th, 6th - pupils equal and reactive to light, visual fields full to confrontation, extraocular muscles intact, no nystagmus ?5th - facial sensation symmetric ?7th - facial strength symmetric ?8th - hearing intact ?9th - palate elevates symmetrically, uvula midline ?11th - shoulder shrug symmetric ?12th - tongue protrusion midline ? ?MOTOR:  ?normal bulk and tone, full strength in the BUE, BLE ? ?SENSORY:  ?normal and symmetric to light touch, pinprick, temperature, vibration ? ?COORDINATION:  ?finger-nose-finger, fine finger movements normal ? ?REFLEXES:  ?deep tendon reflexes present and symmetric ? ?GAIT/STATION:  ?normal ? ? ?DIAGNOSTIC  DATA (LABS, IMAGING, TESTING) ?- I reviewed patient records, labs, notes, testing and imaging myself where available. ? ?Lab Results  ?Component Value Date  ? WBC 5.5 07/24/2021  ? HGB 11.8 (L) 07/24/2021  ? HCT 36.5 (L) 07/24/2021  ? MCV 86.9 07/24/2021  ? PLT 172 07/24/2021  ? ?   ?Component Value Date/Time  ? NA 136 07/24/2021 1040  ? NA 141 05/01/2019 1329  ? K 4.1 07/24/2021 1040  ? CL 102 07/24/2021 1040  ? CO2 23 07/24/2021 1040  ? GLUCOSE 82 07/24/2021 1040  ? BUN <5 (L) 07/24/2021 1040  ? BUN 5 (L) 05/01/2019 1329  ? CREATININE 0.72 07/24/2021 1040  ? CALCIUM 8.1 (L) 07/24/2021 1040  ? PROT 7.1 07/24/2021 1040  ? PROT  7.3 05/01/2019 1329  ? ALBUMIN 2.9 (L) 07/24/2021 1040  ? ALBUMIN 4.2 05/01/2019 1329  ? AST 31 07/24/2021 1040  ? ALT 21 07/24/2021 1040  ? ALKPHOS 162 (H) 07/24/2021 1040  ? BILITOT 0.9 07/24/2021 1040  ? BILITOT 0.2 05/01/2019 1329  ? GFRNONAA >60 07/24/2021 1040  ? GFRAA 118 05/01/2019 1329  ? ?No results found for: CHOL, HDL, LDLCALC, LDLDIRECT, TRIG ?No results found for: HGBA1C ?Lab Results  ?Component Value Date  ? BRAXENMM76 238 07/24/2021  ? ?Lab Results  ?Component Value Date  ? TSH 6.370 (H) 09/11/2020  ? ? ?Head CT 06/2019 ?1. Small left frontal scalp contusion with swelling. No underlying skull fracture. ?2. Chronic appearing minimal small vessel ischemic disease of periventricular white matter. No acute intracranial abnormality. ?3. No acute posttraumatic cervical spine fracture or subluxation ? ? ?ASSESSMENT AND PLAN ? ?61 y.o. year old male  with PMHx of seizure disorder, alcohol abuse, hyperlipidemia who is presenting to establish care for his seizure disorder. Patient reports history of seizure since the age of 86, initially manage with Phenytoin but Levetiracetam added later. He does have breakthrough seizure in the setting of non compliance. I spent extended amount of time discussing the need to be compliant with the medications and also need for alcohol cessation. I will  check ASM level today and continue him on both Phenytoin and Levetiracetam daily dose. I will also order a routine EEG. Advise patient to call me if he has a seizure. I will see him in 3 months for follow up.

## 2021-08-27 LAB — LEVETIRACETAM LEVEL: Levetiracetam Lvl: 25.9 ug/mL (ref 10.0–40.0)

## 2021-08-27 LAB — VITAMIN D 25 HYDROXY (VIT D DEFICIENCY, FRACTURES): Vit D, 25-Hydroxy: 5 ng/mL — ABNORMAL LOW (ref 30.0–100.0)

## 2021-08-27 LAB — PHENYTOIN LEVEL, TOTAL: Phenytoin (Dilantin), Serum: 14 ug/mL (ref 10.0–20.0)

## 2021-08-28 ENCOUNTER — Inpatient Hospital Stay: Payer: Medicare Other | Attending: Oncology

## 2021-08-28 ENCOUNTER — Telehealth: Payer: Self-pay | Admitting: *Deleted

## 2021-08-28 DIAGNOSIS — E538 Deficiency of other specified B group vitamins: Secondary | ICD-10-CM | POA: Insufficient documentation

## 2021-08-28 MED ORDER — VITAMIN D (ERGOCALCIFEROL) 1.25 MG (50000 UNIT) PO CAPS: 50000.0000 [IU] | ORAL_CAPSULE | ORAL | 0 refills | Status: DC

## 2021-08-28 MED ORDER — CYANOCOBALAMIN 1000 MCG/ML IJ SOLN
1000.0000 ug | Freq: Once | INTRAMUSCULAR | Status: AC
  Administered 2021-08-28: 1000 ug via INTRAMUSCULAR
  Filled 2021-08-28: qty 1

## 2021-08-28 NOTE — Telephone Encounter (Signed)
-----   Message from Windell Norfolk, MD sent at 08/28/2021 10:33 AM EDT ----- ?Please call and advise the patient that the recent labs we checked were within normal limits except for a low Vitamin D level. I will send a prescription for Vitamin D supplement to take weekly for a total of 8 weeks.   ?Please remind patient to keep any upcoming appointments or tests and to call us with any interim questions, concerns, problems or updates. Thanks,  ? ?Windell Norfolk, MD ? ?  ?

## 2021-08-28 NOTE — Telephone Encounter (Signed)
Attempted to call pt, LVM for results per DPR. °Ask pt to call back for questions or concerns.  °

## 2021-08-28 NOTE — Addendum Note (Signed)
Addended byWindell Norfolk on: 08/28/2021 10:34 AM ? ? Modules accepted: Orders ? ?

## 2021-08-28 NOTE — Progress Notes (Signed)
Please call and advise the patient that the recent labs we checked were within normal limits except for a low Vitamin D level. I will send a prescription for Vitamin D supplement to take weekly for a total of 8 weeks.   ?Please remind patient to keep any upcoming appointments or tests and to call us with any interim questions, concerns, problems or updates. Thanks,  ? ?Windell Norfolk, MD ? ?

## 2021-09-02 ENCOUNTER — Ambulatory Visit (INDEPENDENT_AMBULATORY_CARE_PROVIDER_SITE_OTHER): Payer: Medicare Other | Admitting: Neurology

## 2021-09-02 DIAGNOSIS — G40909 Epilepsy, unspecified, not intractable, without status epilepticus: Secondary | ICD-10-CM

## 2021-09-02 NOTE — Procedures (Signed)
? ? ?  History: ? ?61 year old man with seizure disorder  ? ?EEG classification: Awake and drowsy ? ?Description of the recording: The background rhythms of this recording consists of a fairly well modulated medium amplitude alpha rhythm of 9 Hz that is reactive to eye opening and closure. As the record progresses, the patient appears to remain in the waking state throughout the recording. Photic stimulation was performed, did not show any abnormalities. Hyperventilation was also performed, did not show any abnormalities. Toward the end of the recording, the patient enters the drowsy state with slight symmetric slowing seen. The patient never enters stage II sleep. No abnormal epileptiform discharges seen during this recording. There was no focal slowing. EKG monitor shows no evidence of cardiac rhythm abnormalities with a heart rate of 84. ? ?Abnormality: None  ? ?Impression: This is a normal EEG recording in the waking and drowsy state. No evidence of interictal epileptiform discharges seen. A normal EEG does not exclude a diagnosis of epilepsy.  ? ? ?Windell Norfolk, MD ?[facility] ?  ?

## 2021-09-26 ENCOUNTER — Inpatient Hospital Stay: Payer: Medicare Other | Attending: Oncology

## 2021-09-26 DIAGNOSIS — E538 Deficiency of other specified B group vitamins: Secondary | ICD-10-CM | POA: Diagnosis present

## 2021-09-26 MED ORDER — CYANOCOBALAMIN 1000 MCG/ML IJ SOLN
1000.0000 ug | Freq: Once | INTRAMUSCULAR | Status: AC
  Administered 2021-09-26: 1000 ug via INTRAMUSCULAR
  Filled 2021-09-26: qty 1

## 2021-10-28 ENCOUNTER — Other Ambulatory Visit: Payer: Self-pay

## 2021-10-28 DIAGNOSIS — D649 Anemia, unspecified: Secondary | ICD-10-CM

## 2021-10-28 DIAGNOSIS — E538 Deficiency of other specified B group vitamins: Secondary | ICD-10-CM

## 2021-10-29 ENCOUNTER — Other Ambulatory Visit: Payer: Self-pay

## 2021-10-29 ENCOUNTER — Inpatient Hospital Stay: Payer: Medicare Other | Attending: Nurse Practitioner

## 2021-10-29 DIAGNOSIS — E538 Deficiency of other specified B group vitamins: Secondary | ICD-10-CM | POA: Insufficient documentation

## 2021-10-29 DIAGNOSIS — D509 Iron deficiency anemia, unspecified: Secondary | ICD-10-CM | POA: Diagnosis present

## 2021-10-29 DIAGNOSIS — E039 Hypothyroidism, unspecified: Secondary | ICD-10-CM | POA: Diagnosis not present

## 2021-10-29 DIAGNOSIS — F1090 Alcohol use, unspecified, uncomplicated: Secondary | ICD-10-CM | POA: Insufficient documentation

## 2021-10-29 DIAGNOSIS — D649 Anemia, unspecified: Secondary | ICD-10-CM

## 2021-10-29 DIAGNOSIS — D696 Thrombocytopenia, unspecified: Secondary | ICD-10-CM | POA: Insufficient documentation

## 2021-10-29 LAB — CBC WITH DIFFERENTIAL/PLATELET
Abs Immature Granulocytes: 0.02 10*3/uL (ref 0.00–0.07)
Basophils Absolute: 0.1 10*3/uL (ref 0.0–0.1)
Basophils Relative: 1 %
Eosinophils Absolute: 0.1 10*3/uL (ref 0.0–0.5)
Eosinophils Relative: 2 %
HCT: 42.9 % (ref 39.0–52.0)
Hemoglobin: 13.6 g/dL (ref 13.0–17.0)
Immature Granulocytes: 0 %
Lymphocytes Relative: 21 %
Lymphs Abs: 1.2 10*3/uL (ref 0.7–4.0)
MCH: 27.4 pg (ref 26.0–34.0)
MCHC: 31.7 g/dL (ref 30.0–36.0)
MCV: 86.5 fL (ref 80.0–100.0)
Monocytes Absolute: 0.8 10*3/uL (ref 0.1–1.0)
Monocytes Relative: 13 %
Neutro Abs: 3.8 10*3/uL (ref 1.7–7.7)
Neutrophils Relative %: 63 %
Platelets: 217 10*3/uL (ref 150–400)
RBC: 4.96 MIL/uL (ref 4.22–5.81)
RDW: 16.6 % — ABNORMAL HIGH (ref 11.5–15.5)
WBC: 6 10*3/uL (ref 4.0–10.5)
nRBC: 0 % (ref 0.0–0.2)

## 2021-10-29 LAB — COMPREHENSIVE METABOLIC PANEL
ALT: 18 U/L (ref 0–44)
AST: 27 U/L (ref 15–41)
Albumin: 3.2 g/dL — ABNORMAL LOW (ref 3.5–5.0)
Alkaline Phosphatase: 139 U/L — ABNORMAL HIGH (ref 38–126)
Anion gap: 6 (ref 5–15)
BUN: 8 mg/dL (ref 6–20)
CO2: 27 mmol/L (ref 22–32)
Calcium: 8.6 mg/dL — ABNORMAL LOW (ref 8.9–10.3)
Chloride: 104 mmol/L (ref 98–111)
Creatinine, Ser: 0.94 mg/dL (ref 0.61–1.24)
GFR, Estimated: >60 mL/min (ref >60)
Glucose, Bld: 104 mg/dL — ABNORMAL HIGH (ref 70–99)
Potassium: 3.9 mmol/L (ref 3.5–5.1)
Sodium: 137 mmol/L (ref 135–145)
Total Bilirubin: 0.4 mg/dL (ref 0.3–1.2)
Total Protein: 7.4 g/dL (ref 6.5–8.1)

## 2021-10-29 LAB — IRON AND TIBC
Iron: 149 ug/dL (ref 45–182)
Saturation Ratios: 51 % — ABNORMAL HIGH (ref 17.9–39.5)
TIBC: 291 ug/dL (ref 250–450)
UIBC: 142 ug/dL

## 2021-10-29 LAB — TSH: TSH: 11.996 u[IU]/mL — ABNORMAL HIGH (ref 0.350–4.500)

## 2021-10-29 LAB — VITAMIN B12: Vitamin B-12: 371 pg/mL (ref 180–914)

## 2021-10-29 LAB — FOLATE: Folate: 6.8 ng/mL (ref >5.9)

## 2021-10-29 LAB — FERRITIN: Ferritin: 32 ng/mL (ref 24–336)

## 2021-10-31 ENCOUNTER — Encounter: Payer: Self-pay | Admitting: Oncology

## 2021-10-31 ENCOUNTER — Inpatient Hospital Stay: Payer: Medicare Other

## 2021-10-31 ENCOUNTER — Inpatient Hospital Stay (HOSPITAL_BASED_OUTPATIENT_CLINIC_OR_DEPARTMENT_OTHER): Payer: Medicare Other | Admitting: Oncology

## 2021-10-31 VITALS — BP 111/79 | HR 70 | Temp 96.4°F | Wt 118.0 lb

## 2021-10-31 DIAGNOSIS — E538 Deficiency of other specified B group vitamins: Secondary | ICD-10-CM

## 2021-10-31 DIAGNOSIS — E038 Other specified hypothyroidism: Secondary | ICD-10-CM

## 2021-10-31 DIAGNOSIS — R946 Abnormal results of thyroid function studies: Secondary | ICD-10-CM

## 2021-10-31 DIAGNOSIS — I959 Hypotension, unspecified: Secondary | ICD-10-CM

## 2021-10-31 DIAGNOSIS — F1010 Alcohol abuse, uncomplicated: Secondary | ICD-10-CM | POA: Diagnosis not present

## 2021-10-31 DIAGNOSIS — D649 Anemia, unspecified: Secondary | ICD-10-CM

## 2021-10-31 DIAGNOSIS — R7303 Prediabetes: Secondary | ICD-10-CM

## 2021-10-31 DIAGNOSIS — D696 Thrombocytopenia, unspecified: Secondary | ICD-10-CM | POA: Diagnosis not present

## 2021-10-31 DIAGNOSIS — R768 Other specified abnormal immunological findings in serum: Secondary | ICD-10-CM

## 2021-10-31 DIAGNOSIS — R790 Abnormal level of blood mineral: Secondary | ICD-10-CM

## 2021-10-31 DIAGNOSIS — D509 Iron deficiency anemia, unspecified: Secondary | ICD-10-CM | POA: Diagnosis not present

## 2021-10-31 LAB — T4, FREE: Free T4: 0.64 ng/dL (ref 0.61–1.12)

## 2021-10-31 MED ORDER — VITAMIN B-12 1000 MCG PO TABS: 1000.0000 ug | ORAL_TABLET | Freq: Every day | ORAL | 3 refills | Status: AC

## 2021-11-01 ENCOUNTER — Encounter: Payer: Self-pay | Admitting: Oncology

## 2021-11-01 NOTE — Progress Notes (Signed)
Hematology/Oncology Progress note [phone] [phone]     Patient Care Team: Pcp, No as PCP - General   Name of the patient: Trevor Lambert  [id number]  [birth year]   REASON FOR VISIT  follow-up for anemia and thrombocytopenia, vitamin B12 and folic acid deficiency.  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  Repeat MRI showed stable lesion. I will hold off additional image at this point.  INTERVAL HISTORY Trevor Lambert is a 61 y.o.[place] who has above oncology history reviewed by me today presented for follow up visit for management of microcytic anemia,  Reports feeling well. Drinks 1 can of beer per day. No new complaints.  He follows up with neurology for seizure. He takes all his meds.   Review of Systems  Constitutional:  Negative for appetite change, chills, fatigue, fever and unexpected weight change.  HENT:   Negative for hearing loss and voice change.   Eyes:  Negative for eye problems and icterus.  Respiratory:  Negative for chest tightness, cough and shortness of breath.   Cardiovascular:  Negative for chest pain and leg swelling.  Gastrointestinal:  Negative for abdominal distention and abdominal pain.  Endocrine: Negative for hot flashes.  Genitourinary:  Negative for difficulty urinating, dysuria and frequency.   Musculoskeletal:  Negative for arthralgias.  Skin:  Negative for itching and rash.  Neurological:  Negative for light-headedness and numbness.  Hematological:  Negative for adenopathy. Does not bruise/bleed easily.  Psychiatric/Behavioral:  Negative for confusion.       No Known Allergies   Past Medical History:  Diagnosis Date   Seizures (HCC)    Vitamin B12 deficiency 02/03/2019     Past Surgical History:  Procedure Laterality Date    ESOPHAGOGASTRODUODENOSCOPY (EGD) WITH PROPOFOL N/A 11/07/2018   Procedure: ESOPHAGOGASTRODUODENOSCOPY (EGD) WITH PROPOFOL;  Surgeon: Wyline Mood, MD;  Location: [facility] ENDOSCOPY;  Service: Gastroenterology;  Laterality: N/A;   NO PAST SURGERIES      Social History   Socioeconomic History   Marital status: Single    Spouse name: Not on file   Number of children: Not on file   Years of education: Not on file   Highest education level: Not on file  Occupational History   Not on file  Tobacco Use   Smoking status: Every Day    Packs/day: 0.50    Years: 41.00    Total pack years: 20.50    Types: Cigarettes   Smokeless tobacco: Never  Vaping Use   Vaping Use: Never used  Substance and Sexual Activity   Alcohol use: Yes    Alcohol/week: 4.0 standard drinks of alcohol    Types: 4 Cans of beer per week   Drug use: Not Currently   Sexual activity: Not on file  Other Topics Concern   Not on file  Social History Narrative   Not on file   Social Determinants of Health   Financial Resource Strain: Not on file  Food Insecurity: Not on file  Transportation Needs: Not on file  Physical Activity: Not on file  Stress: Not on file  Social Connections: Not on file  Intimate Partner Violence: Not on file    Family History  Problem Relation Age of Onset   Cancer Neg Hx      Current Outpatient Medications:    albuterol (VENTOLIN HFA) 108 (90 Base)  MCG/ACT inhaler, , Disp: , Rfl:    folic acid (FOLVITE) 1 MG tablet, TAKE 1 TABLET BY MOUTH EVERY DAY, Disp: 90 tablet, Rfl: 1   levETIRAcetam (KEPPRA XR) 500 MG 24 hr tablet, Take 2 tablets (1,000 mg total) by mouth daily., Disp: 180 tablet, Rfl: 3   phenytoin (DILANTIN) 100 MG ER capsule, Take 3 capsules (300 mg total) by mouth daily., Disp: 270 capsule, Rfl: 3   simvastatin (ZOCOR) 20 MG tablet, Take 20 mg by mouth daily at 6 PM. , Disp: , Rfl:    Vitamin D, Ergocalciferol, (DRISDOL) 1.25 MG (50000 UNIT) CAPS capsule, Take 1 capsule  (50,000 Units total) by mouth every 7 (seven) days., Disp: 8 capsule, Rfl: 0   vitamin B-12 (CYANOCOBALAMIN) 1000 MCG tablet, Take 1 tablet (1,000 mcg total) by mouth daily., Disp: 90 tablet, Rfl: 3  Physical exam: ECOG 1 Vitals:   10/31/21 1155  BP: 111/79  Pulse: 70  Temp: (!) 96.4 F (35.8 C)  TempSrc: Tympanic  Weight: 118 lb (53.5 kg)   Physical Exam Constitutional:      General: He is not in acute distress. HENT:     Head: Normocephalic and atraumatic.  Eyes:     General: No scleral icterus.    Pupils: Pupils are equal, round, and reactive to light.  Cardiovascular:     Rate and Rhythm: Normal rate and regular rhythm.     Heart sounds: Normal heart sounds.  Pulmonary:     Effort: Pulmonary effort is normal. No respiratory distress.     Breath sounds: No wheezing.  Abdominal:     General: Bowel sounds are normal. There is no distension.     Palpations: Abdomen is soft. There is no mass.     Tenderness: There is no abdominal tenderness.  Musculoskeletal:        General: No deformity. Normal range of motion.     Cervical back: Normal range of motion and neck supple.  Skin:    General: Skin is warm and dry.     Findings: No erythema or rash.  Neurological:     Mental Status: He is alert and oriented to person, place, and time. Mental status is at baseline.     Cranial Nerves: No cranial nerve deficit.     Coordination: Coordination normal.  Psychiatric:        Mood and Affect: Mood normal.        Behavior: Behavior normal.        Thought Content: Thought content normal.        Latest Ref Rng & Units 10/29/2021    1:09 PM  CMP  Glucose 70 - 99 mg/dL 741   BUN 6 - 20 mg/dL 8   Creatinine 2.87 - 8.67 mg/dL 6.72   Sodium 094 - 709 mmol/L 137   Potassium 3.5 - 5.1 mmol/L 3.9   Chloride 98 - 111 mmol/L 104   CO2 22 - 32 mmol/L 27   Calcium 8.9 - 10.3 mg/dL 8.6   Total Protein 6.5 - 8.1 g/dL 7.4   Total Bilirubin 0.3 - 1.2 mg/dL 0.4   Alkaline Phos 38 - 126 U/L  139   AST 15 - 41 U/L 27   ALT 0 - 44 U/L 18       Latest Ref Rng & Units 10/29/2021    1:09 PM  CBC  WBC 4.0 - 10.5 K/uL 6.0   Hemoglobin 13.0 - 17.0 g/dL 62.8   Hematocrit 36.6 - 52.0 %  42.9   Platelets 150 - 400 K/uL 217     No results found.   Assessment and plan   1. Alcohol abuse   2. Folate deficiency   3. Vitamin B12 deficiency   4. Thrombocytopenia (HCC)   5. Subclinical hypothyroidism   6. Abnormal iron saturation    #Anemia is likely secondary to folate deficiency, chronic alcohol use Labs are reviewed and discussed with patient. Hemoglobin has normalized.   # Subclinical hypothyroidism. High Tsh, normal free T4. Asymptomatic. Observation.  Recommend patient to establish care with PCP. Local pcp office contact list was provided to him.  #Chronically increased iron saturation, likely due to alcohol use. negative hemochromatosis screening testing  Repeat US RUQ for evaluation   . #History of vitamin B12 deficiency, continue B12 supplementation.  B12 has improved #Folate deficiency, continue folic acid   #Chronic alcohol use, alcohol cessation was discussed with patient.  Orders Placed This Encounter  Procedures   US Abdomen Limited RUQ (LIVER/GB)    Standing Status:   Future    Standing Expiration Date:   11/01/2022    Order Specific Question:   Reason for Exam (SYMPTOM  OR DIAGNOSIS REQUIRED)    Answer:   Chronic alcohol abuse    Order Specific Question:   Preferred imaging location?    Answer:   [facility]   T4, free   CBC with Differential/Platelet    Standing Status:   Future    Standing Expiration Date:   11/01/2022   Comprehensive metabolic panel    Standing Status:   Future    Standing Expiration Date:   11/01/2022   Vitamin B12    Standing Status:   Future    Standing Expiration Date:   11/01/2022   Ferritin    Standing Status:   Future    Standing Expiration Date:   11/01/2022   Iron and TIBC    Standing Status:   Future    Standing  Expiration Date:   11/01/2022   Folate    Standing Status:   Future    Standing Expiration Date:   11/01/2022       Labs in 6 months (cbc,cmp, iron,ferr, folat, b12, TSH free T4)/ MD a few days after labs   Rickard Patience, MD, PhD Hematology Oncology [facility] at [facility] Pager- [phone] 11/01/2021

## 2021-11-10 ENCOUNTER — Ambulatory Visit
Admission: RE | Admit: 2021-11-10 | Discharge: 2021-11-10 | Disposition: A | Discharge status: Home / Self Care | Payer: Medicare Other | Source: Ambulatory Visit | Attending: Oncology | Admitting: Oncology

## 2021-11-10 DIAGNOSIS — F1010 Alcohol abuse, uncomplicated: Secondary | ICD-10-CM

## 2021-11-26 ENCOUNTER — Ambulatory Visit (INDEPENDENT_AMBULATORY_CARE_PROVIDER_SITE_OTHER): Payer: Medicare Other | Admitting: Neurology

## 2021-11-26 ENCOUNTER — Encounter: Payer: Self-pay | Admitting: Neurology

## 2021-11-26 VITALS — BP 127/80 | HR 81 | Ht 67.0 in | Wt 118.0 lb

## 2021-11-26 DIAGNOSIS — Z789 Other specified health status: Secondary | ICD-10-CM

## 2021-11-26 DIAGNOSIS — G40909 Epilepsy, unspecified, not intractable, without status epilepticus: Secondary | ICD-10-CM

## 2021-11-26 NOTE — Patient Instructions (Addendum)
Continue with Phenytoin 300 mg daily  Continue with Levetiracetam XR 1000 mg daily  Alcohol cessation  Follow up in 3 months  He is not driving a car

## 2021-11-26 NOTE — Progress Notes (Signed)
[facility]  PATIENT: Trevor Lambert DOB: 04/30/1961  REQUESTING CLINICIAN: No ref. provider found HISTORY FROM: Patient and mother  REASON FOR VISIT: Establish care for epilepsy    HISTORICAL  CHIEF COMPLAINT:  Chief Complaint  Patient presents with   Follow-up    With mother Reports sz 2 weeks ago    INTERVAL HISTORY 11/26/21 Trevor Lambert presents today for follow-up, he is accompanied by his mother.  He reported being compliant with his medication, he takes phenytoin 200 mg daily and Keppra extended release 500 mg daily into the 1000 mg daily.  He does also take additional Keppra 500 mg daily.  He reported he has been doing well except 2 weeks ago he did have a seizure.  He report reports compliance with his medication.  He continues to drink reported he drink about 3X40 ounce beer 5 times a week.   HISTORY OF PRESENT ILLNESS:  This is a 62 year old man with PMHx of seizure disorder since the age of 61, alcohol abuse who is presenting to establish care. Patient reports history of seizure and medications non adherence, currently is on Phenytoin 300 mg and Keppra 500 mg TID but but again he has been taking in inconsistently. Last seizure was reported 2 weeks ago, when he believe that he had back to back seizures. Patient reports that he feel in the bathroom and has to crawl back to bed. Denies any injuries. He denies any seizure risk factor other than grandmother had seizures. He described his seizures as grand mal seizure, generalized convulsion.  He does also report long history of alcohol abuse. Reports drinking 3 to 4 days out of the week, 2 40 oz of beer. Denies any alcohol withdrawal seizures.  Currently patient is not driving.   Handedness: Right hand  Onset:Since the age of 61   Seizure Type: Generalized   Current frequency: Unclear, patient reports last seizure was 2 weeks ago   Any injuries from seizures: None   Seizure risk factors: Grandmother    Previous ASMs: Phenytoin, Levetiracetam   Currenty ASMs: Phenytoin 300 mg daily and Levetiracetam XR 1000 mg   ASMs side effects: Denies   Brain Images: Small vessel disease, no acute intracranial abnormality.   Previous EEGs: None available for review    OTHER MEDICAL CONDITIONS: Seizure, alcohol abuse, hyperlipidemia   REVIEW OF SYSTEMS: Full 14 system review of systems performed and negative with exception of: as noted in the HPI   ALLERGIES: No Known Allergies  HOME MEDICATIONS: Outpatient Medications Prior to Visit  Medication Sig Dispense Refill   albuterol (VENTOLIN HFA) 108 (90 Base) MCG/ACT inhaler      folic acid (FOLVITE) 1 MG tablet TAKE 1 TABLET BY MOUTH EVERY DAY 90 tablet 1   simvastatin (ZOCOR) 20 MG tablet Take 20 mg by mouth daily at 6 PM.      vitamin B-12 (CYANOCOBALAMIN) 1000 MCG tablet Take 1 tablet (1,000 mcg total) by mouth daily. 90 tablet 3   Vitamin D, Ergocalciferol, (DRISDOL) 1.25 MG (50000 UNIT) CAPS capsule Take 1 capsule (50,000 Units total) by mouth every 7 (seven) days. 8 capsule 0   levETIRAcetam (KEPPRA XR) 500 MG 24 hr tablet Take 2 tablets (1,000 mg total) by mouth daily. 180 tablet 3   phenytoin (DILANTIN) 100 MG ER capsule Take 3 capsules (300 mg total) by mouth daily. 270 capsule 3   No facility-administered medications prior to visit.    PAST MEDICAL HISTORY: Past Medical History:  Diagnosis  Date   Seizures (HCC)    Vitamin B12 deficiency 02/03/2019    PAST SURGICAL HISTORY: Past Surgical History:  Procedure Laterality Date   ESOPHAGOGASTRODUODENOSCOPY (EGD) WITH PROPOFOL N/A 11/07/2018   Procedure: ESOPHAGOGASTRODUODENOSCOPY (EGD) WITH PROPOFOL;  Surgeon: Wyline Mood, MD;  Location: [facility] ENDOSCOPY;  Service: Gastroenterology;  Laterality: N/A;   NO PAST SURGERIES      FAMILY HISTORY: Family History  Problem Relation Age of Onset   Cancer Neg Hx     SOCIAL HISTORY: Social History   Socioeconomic History   Marital  status: Single    Spouse name: Not on file   Number of children: Not on file   Years of education: Not on file   Highest education level: Not on file  Occupational History   Not on file  Tobacco Use   Smoking status: Every Day    Packs/day: 0.50    Years: 41.00    Total pack years: 20.50    Types: Cigarettes   Smokeless tobacco: Never  Vaping Use   Vaping Use: Never used  Substance and Sexual Activity   Alcohol use: Yes    Alcohol/week: 4.0 standard drinks of alcohol    Types: 4 Cans of beer per week   Drug use: Not Currently   Sexual activity: Not on file  Other Topics Concern   Not on file  Social History Narrative   Not on file   Social Determinants of Health   Financial Resource Strain: Not on file  Food Insecurity: Not on file  Transportation Needs: Not on file  Physical Activity: Not on file  Stress: Not on file  Social Connections: Not on file  Intimate Partner Violence: Not on file    PHYSICAL EXAM  GENERAL EXAM/CONSTITUTIONAL: Vitals:  Vitals:   11/26/21 0912  BP: 127/80  Pulse: 81  Weight: 118 lb (53.5 kg)  Height: 5\' 7"  (1.702 m)   Body mass index is 18.48 kg/m. Wt Readings from Last 3 Encounters:  11/26/21 118 lb (53.5 kg)  10/31/21 118 lb (53.5 kg)  08/26/21 115 lb (52.2 kg)   Patient is in no distress; well developed, nourished and groomed; neck is supple  EYES: Pupils round and reactive to light, Visual fields full to confrontation, Extraocular movements intacts,  No results found.  MUSCULOSKELETAL: Gait, strength, tone, movements noted in Neurologic exam below  NEUROLOGIC: MENTAL STATUS:      No data to display         awake, alert, oriented to person, place, date and month but not year. Could not tell me the current 10/26/21 president  Unable to spell PENCIL (stop school at 10 grade), unable to calculate the number of quarter in $1,75 language fluent, comprehension intact, naming intact  CRANIAL NERVE:  2nd, 3rd, 4th, 6th -  pupils equal and reactive to light, visual fields full to confrontation, extraocular muscles intact, no nystagmus 5th - facial sensation symmetric 7th - facial strength symmetric 8th - hearing intact 9th - palate elevates symmetrically, uvula midline 11th - shoulder shrug symmetric 12th - tongue protrusion midline  MOTOR:  normal bulk and tone, full strength in the BUE, BLE  SENSORY:  normal and symmetric to light touch, pinprick, temperature, vibration  COORDINATION:  finger-nose-finger, fine finger movements normal  REFLEXES:  deep tendon reflexes present and symmetric  GAIT/STATION:  normal   DIAGNOSTIC DATA (LABS, IMAGING, TESTING) - I reviewed patient records, labs, notes, testing and imaging myself where available.  Lab Results  Component Value Date  WBC 6.0 10/29/2021   HGB 13.6 10/29/2021   HCT 42.9 10/29/2021   MCV 86.5 10/29/2021   PLT 217 10/29/2021      Component Value Date/Time   NA 137 10/29/2021 1309   NA 141 05/01/2019 1329   K 3.9 10/29/2021 1309   CL 104 10/29/2021 1309   CO2 27 10/29/2021 1309   GLUCOSE 104 (H) 10/29/2021 1309   BUN 8 10/29/2021 1309   BUN 5 (L) 05/01/2019 1329   CREATININE 0.94 10/29/2021 1309   CALCIUM 8.6 (L) 10/29/2021 1309   PROT 7.4 10/29/2021 1309   PROT 7.3 05/01/2019 1329   ALBUMIN 3.2 (L) 10/29/2021 1309   ALBUMIN 4.2 05/01/2019 1329   AST 27 10/29/2021 1309   ALT 18 10/29/2021 1309   ALKPHOS 139 (H) 10/29/2021 1309   BILITOT 0.4 10/29/2021 1309   BILITOT 0.2 05/01/2019 1329   GFRNONAA >60 10/29/2021 1309   GFRAA 118 05/01/2019 1329   No results found for: "CHOL", "HDL", "LDLCALC", "LDLDIRECT", "TRIG" No results found for: "HGBA1C" Lab Results  Component Value Date   VITAMINB12 371 10/29/2021   Lab Results  Component Value Date   TSH 11.996 (H) 10/29/2021    Head CT 06/2019 1. Small left frontal scalp contusion with swelling. No underlying skull fracture. 2. 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 No acute intracranial abnormality. 3. No acute posttraumatic cervical spine fracture or subluxation   ASSESSMENT AND PLAN  61 y.o. year old male  with PMHx of seizure disorder, alcohol abuse, hyperlipidemia here for follow up. He reports compliance with his medications Phenytoin and levetiracetam but has been taking the levetiracetam wrong, only takes XR 500 mg instead of a 1000 mg. Also discussed alcohol abstinence.    1. Seizure disorder (HCC)   2. Alcohol use      Patient Instructions  Continue with Phenytoin 300 mg daily  Continue with Levetiracetam XR 1000 mg daily  Alcohol cessation  Follow up in 3 months  He is not driving a car    Per [employer] statutes, patients with seizures are not allowed to drive until they have been seizure-free for six months.  Other recommendations include using caution when using heavy equipment or power tools. Avoid working on ladders or at heights. Take showers instead of baths.  Do not swim alone.  Ensure the water temperature is not too high on the home water heater. Do not go swimming alone. Do not lock yourself in a room alone (i.e. bathroom). When caring for infants or small children, sit down when holding, feeding, or changing them to minimize risk of injury to the child in the event you have a seizure. Maintain good sleep hygiene. Avoid alcohol.  Also recommend adequate sleep, hydration, good diet and minimize stress.   During the Seizure  - First, ensure adequate ventilation and place patients on the floor on their left side  Loosen clothing around the neck and ensure the airway is patent. If the patient is clenching the teeth, do not force the mouth open with any object as this can cause severe damage - Remove all items from the surrounding that can be hazardous. The patient may be oblivious to what's happening and may not even know what he or she is doing. If the patient is confused  and wandering, either gently guide him/her away and block access to outside areas - Reassure the individual and be comforting - Call 911. In most cases, the seizure  ends before EMS arrives. However, there are cases when seizures may last over 3 to 5 minutes. Or the individual may have developed breathing difficulties or severe injuries. If a pregnant patient or a person with diabetes develops a seizure, it is prudent to call an ambulance. - Finally, if the patient does not regain full consciousness, then call EMS. Most patients will remain confused for about 45 to 90 minutes after a seizure, so you must use judgment in calling for help. - Avoid restraints but make sure the patient is in a bed with padded side rails - Place the individual in a lateral position with the neck slightly flexed; this will help the saliva drain from the mouth and prevent the tongue from falling backward - Remove all nearby furniture and other hazards from the area - Provide verbal assurance as the individual is regaining consciousness - Provide the patient with privacy if possible - Call for help and start treatment as ordered by the caregiver   After the Seizure (Postictal Stage)  After a seizure, most patients experience confusion, fatigue, muscle pain and/or a headache. Thus, one should permit the individual to sleep. For the next few days, reassurance is essential. Being calm and helping reorient the person is also of importance.  Most seizures are painless and end spontaneously. Seizures are not harmful to others but can lead to complications such as stress on the lungs, brain and the heart. Individuals with prior lung problems may develop labored breathing and respiratory distress.     No orders of the defined types were placed in this encounter.   No orders of the defined types were placed in this encounter.   Return in about 3 months (around 02/26/2022).  I have spent a total of 30 minutes dedicated to  this patient today, preparing to see patient, performing a medically appropriate examination and evaluation, ordering tests and/or medications and procedures, and counseling and educating the patient/family/caregiver; independently interpreting result and communicating results to the family/patient/caregiver; and documenting clinical information in the electronic medical record.   Windell Norfolk, MD 11/26/2021, 9:42 AM  [facility] [address], [address] [place], [place] [address] [phone]

## 2022-02-26 ENCOUNTER — Ambulatory Visit (INDEPENDENT_AMBULATORY_CARE_PROVIDER_SITE_OTHER): Payer: Medicare Other | Admitting: Neurology

## 2022-02-26 ENCOUNTER — Encounter: Payer: Self-pay | Admitting: Neurology

## 2022-02-26 VITALS — BP 101/68 | HR 76 | Ht 66.0 in | Wt 117.0 lb

## 2022-02-26 DIAGNOSIS — G40909 Epilepsy, unspecified, not intractable, without status epilepticus: Secondary | ICD-10-CM

## 2022-02-26 DIAGNOSIS — E559 Vitamin D deficiency, unspecified: Secondary | ICD-10-CM | POA: Diagnosis not present

## 2022-02-26 DIAGNOSIS — Z5181 Encounter for therapeutic drug level monitoring: Secondary | ICD-10-CM | POA: Diagnosis not present

## 2022-02-26 NOTE — Patient Instructions (Signed)
Continue with phenytoin XR 300 mg daily Continue with levetiracetam XR 1000 mg daily We will check antiseizure level today, also will check vitamin D Continue to follow with PCP Return in 3 months or sooner if worse.  Please contact us if you do have a breakthrough seizure

## 2022-02-26 NOTE — Progress Notes (Signed)
[facility]  PATIENT: Trevor Lambert DOB: June 02, 1960  REQUESTING CLINICIAN: No ref. provider found HISTORY FROM: Patient and mother  REASON FOR VISIT: Establish care for epilepsy    HISTORICAL  CHIEF COMPLAINT:  Chief Complaint  Patient presents with   Follow-up    Rm 13. Alone. Patient states he had two back to back seizures last week.   INTERVAL HISTORY 02/26/2022:  Trevor Lambert presents today for follow-up.  Today he is alone.  His last visit was in July.  Since July he has been doing well, reports compliance with phenytoin 300 mg daily and Keppra 1000 mg daily.  He reported he had seizures 2 weeks ago, he fell off his bed, denies any major injury.  He did not go to the hospital.  He did not call the office to let us know.  He reports on the specific day he has not been drinking heavily, denies any recent infection or fevers.  He reports that he cut his alcohol drink to 1 beer daily.  Previously he was drinking up to 5 beers per day.   INTERVAL HISTORY 11/26/21 Trevor Lambert presents today for follow-up, he is accompanied by his mother.  He reported being compliant with his medication, he takes phenytoin 300 mg daily and Keppra extended release 500 mg daily into the 1000 mg daily.  He does also take additional Keppra 500 mg daily.  He reported he has been doing well except 2 weeks ago he did have a seizure.  He report reports compliance with his medication.  He continues to drink reported he drink about 3X40 ounce beer 5 times a week.   HISTORY OF PRESENT ILLNESS:  This is a 61 year old man with PMHx of seizure disorder since the age of 9, alcohol abuse who is presenting to establish care. Patient reports history of seizure and medications non adherence, currently is on Phenytoin 300 mg and Keppra 500 mg TID but but again he has been taking in inconsistently. Last seizure was reported 2 weeks ago, when he believe that he had back to back seizures. Patient reports that he feel in  the bathroom and has to crawl back to bed. Denies any injuries. He denies any seizure risk factor other than grandmother had seizures. He described his seizures as grand mal seizure, generalized convulsion.  He does also report long history of alcohol abuse. Reports drinking 3 to 4 days out of the week, 2 40 oz of beer. Denies any alcohol withdrawal seizures.  Currently patient is not driving.   Handedness: Right hand  Onset:Since the age of 38   Seizure Type: Generalized   Current frequency: Unclear, patient reports last seizure was 2 weeks ago   Any injuries from seizures: None   Seizure risk factors: Grandmother   Previous ASMs: Phenytoin, Levetiracetam   Currenty ASMs: Phenytoin 300 mg daily and Levetiracetam XR 1000 mg   ASMs side effects: Denies   Brain Images: Small vessel disease, no acute intracranial abnormality.   Previous EEGs: None available for review    OTHER MEDICAL CONDITIONS: Seizure, alcohol abuse, hyperlipidemia   REVIEW OF SYSTEMS: Full 14 system review of systems performed and negative with exception of: as noted in the HPI   ALLERGIES: No Known Allergies  HOME MEDICATIONS: Outpatient Medications Prior to Visit  Medication Sig Dispense Refill   albuterol (VENTOLIN HFA) 108 (90 Base) MCG/ACT inhaler      phenytoin (DILANTIN) 100 MG ER capsule Take 3 capsules (300 mg total) by mouth  daily. 270 capsule 3   vitamin B-12 (CYANOCOBALAMIN) 1000 MCG tablet Take 1 tablet (1,000 mcg total) by mouth daily. 90 tablet 3   levETIRAcetam (KEPPRA XR) 500 MG 24 hr tablet Take 2 tablets (1,000 mg total) by mouth daily. (Patient not taking: Reported on 02/26/2022) [id number] tablet 3   folic acid (FOLVITE) 1 MG tablet TAKE 1 TABLET BY MOUTH EVERY DAY 90 tablet 1   simvastatin (ZOCOR) 20 MG tablet Take 20 mg by mouth daily at 6 PM.      Vitamin D, Ergocalciferol, (DRISDOL) 1.25 MG (50000 UNIT) CAPS capsule Take 1 capsule (50,000 Units total) by mouth every 7 (seven) days. 8  capsule 0   No facility-administered medications prior to visit.    PAST MEDICAL HISTORY: Past Medical History:  Diagnosis Date   Seizures ([place])    Vitamin B12 deficiency 02/03/2019    PAST SURGICAL HISTORY: Past Surgical History:  Procedure Laterality Date   ESOPHAGOGASTRODUODENOSCOPY (EGD) WITH PROPOFOL N/A 11/07/2018   Procedure: ESOPHAGOGASTRODUODENOSCOPY (EGD) WITH PROPOFOL;  Surgeon: Jonathon Bellows, MD;  Location: [facility] ENDOSCOPY;  Service: Gastroenterology;  Laterality: N/A;   NO PAST SURGERIES      FAMILY HISTORY: Family History  Problem Relation Age of Onset   Cancer Neg Hx     SOCIAL HISTORY: Social History   Socioeconomic History   Marital status: Single    Spouse name: Not on file   Number of children: Not on file   Years of education: Not on file   Highest education level: Not on file  Occupational History   Not on file  Tobacco Use   Smoking status: Every Day    Packs/day: 0.50    Years: 41.00    Total pack years: 20.50    Types: Cigarettes   Smokeless tobacco: Never  Vaping Use   Vaping Use: Never used  Substance and Sexual Activity   Alcohol use: Yes    Alcohol/week: 4.0 standard drinks of alcohol    Types: 4 Cans of beer per week   Drug use: Not Currently   Sexual activity: Not on file  Other Topics Concern   Not on file  Social History Narrative   Not on file   Social Determinants of Health   Financial Resource Strain: Not on file  Food Insecurity: Not on file  Transportation Needs: Not on file  Physical Activity: Not on file  Stress: Not on file  Social Connections: Not on file  Intimate Partner Violence: Not on file    PHYSICAL EXAM  GENERAL EXAM/CONSTITUTIONAL: Vitals:  Vitals:   02/26/22 0858  BP: 101/68  Pulse: 76  Weight: 117 lb (53.1 kg)  Height: 5\' 6"  (1.676 m)   Body mass index is 18.88 kg/m. Wt Readings from Last 3 Encounters:  02/26/22 117 lb (53.1 kg)  11/26/21 118 lb (53.5 kg)  10/31/21 118 lb (53.5 kg)    Patient is in no distress; well developed, nourished and groomed; neck is supple  EYES: Pupils round and reactive to light, Visual fields full to confrontation, Extraocular movements intacts,  No results found.  MUSCULOSKELETAL: Gait, strength, tone, movements noted in Neurologic exam below  NEUROLOGIC: MENTAL STATUS:      No data to display         awake, alert, oriented to person, place, date and month but not year. Could not tell me the current [place] president  Unable to spell PENCIL (stop school at 10 grade), unable to calculate the number of quarter in $1,75  language fluent, comprehension intact, naming intact  CRANIAL NERVE:  2nd, 3rd, 4th, 6th - pupils equal and reactive to light, visual fields full to confrontation, extraocular muscles intact, no nystagmus 5th - facial sensation symmetric 7th - facial strength symmetric 8th - hearing intact 9th - palate elevates symmetrically, uvula midline 11th - shoulder shrug symmetric 12th - tongue protrusion midline  MOTOR:  normal bulk and tone, full strength in the BUE, BLE  SENSORY:  normal and symmetric to light touch  COORDINATION:  finger-nose-finger, fine finger movements normal  REFLEXES:  deep tendon reflexes present and symmetric  GAIT/STATION:  normal   DIAGNOSTIC DATA (LABS, IMAGING, TESTING) - I reviewed patient records, labs, notes, testing and imaging myself where available.  Lab Results  Component Value Date   WBC 6.0 10/29/2021   HGB 13.6 10/29/2021   HCT 42.9 10/29/2021   MCV 86.5 10/29/2021   PLT 217 10/29/2021      Component Value Date/Time   NA 137 10/29/2021 1309   NA 141 05/01/2019 1329   K 3.9 10/29/2021 1309   CL 104 10/29/2021 1309   CO2 27 10/29/2021 1309   GLUCOSE 104 (H) 10/29/2021 1309   BUN 8 10/29/2021 1309   BUN 5 (L) 05/01/2019 1329   CREATININE 0.94 10/29/2021 1309   CALCIUM 8.6 (L) 10/29/2021 1309   PROT 7.4 10/29/2021 1309   PROT 7.3 05/01/2019 1329   ALBUMIN 3.2  (L) 10/29/2021 1309   ALBUMIN 4.2 05/01/2019 1329   AST 27 10/29/2021 1309   ALT 18 10/29/2021 1309   ALKPHOS 139 (H) 10/29/2021 1309   BILITOT 0.4 10/29/2021 1309   BILITOT 0.2 05/01/2019 1329   GFRNONAA >60 10/29/2021 1309   GFRAA 118 05/01/2019 1329   No results found for: "CHOL", "HDL", "LDLCALC", "LDLDIRECT", "TRIG" No results found for: "HGBA1C" Lab Results  Component Value Date   VITAMINB12 371 10/29/2021   Lab Results  Component Value Date   TSH 11.996 (H) 10/29/2021    Head CT 06/2019 1. Small left frontal scalp contusion with swelling. No underlying skull fracture. 2. Chronic appearing minimal small vessel ischemic disease of periventricular white matter. No acute intracranial abnormality. 3. No acute posttraumatic cervical spine fracture or subluxation   Routine EEG 09/02/21 This is a normal EEG recording in the waking and drowsy state. No evidence of interictal epileptiform discharges seen. A normal EEG does not exclude a diagnosis of epilepsy.     ASSESSMENT AND PLAN  61 y.o. year old male  with PMHx of seizure disorder, alcohol abuse, hyperlipidemia here for follow up. He reports compliance with his medications Phenytoin XR 300 mg daily and levetiracetam XR 1000 mg daily.  Overall he is doing well but had a breakthrough seizure 2 weeks ago.  He has also decrease his alcohol consumption, now reducing to 1 beer per day.  At the moment we will continue current medications, advised patient to contact me if he had another breakthrough seizure, at that time we will likely increase the Keppra to 1500 mg daily.  Continue to follow with PCP and return in 3 months or sooner if worse.     1. Seizure disorder ([place])   2. Therapeutic drug monitoring   3. Vitamin D deficiency      Patient Instructions  Continue with phenytoin XR 300 mg daily Continue with levetiracetam XR 1000 mg daily We will check antiseizure level today, also will check vitamin D Continue to follow with  PCP Return in 3 months or sooner  if worse.  Please contact us if you do have a breakthrough seizure    Per [facility] statutes, patients with seizures are not allowed to drive until they have been seizure-free for six months.  Other recommendations include using caution when using heavy equipment or power tools. Avoid working on ladders or at heights. Take showers instead of baths.  Do not swim alone.  Ensure the water temperature is not too high on the home water heater. Do not go swimming alone. Do not lock yourself in a room alone (i.e. bathroom). When caring for infants or small children, sit down when holding, feeding, or changing them to minimize risk of injury to the child in the event you have a seizure. Maintain good sleep hygiene. Avoid alcohol.  Also recommend adequate sleep, hydration, good diet and minimize stress.   During the Seizure  - First, ensure adequate ventilation and place patients on the floor on their left side  Loosen clothing around the neck and ensure the airway is patent. If the patient is clenching the teeth, do not force the mouth open with any object as this can cause severe damage - Remove all items from the surrounding that can be hazardous. The patient may be oblivious to what's happening and may not even know what he or she is doing. If the patient is confused and wandering, either gently guide him/her away and block access to outside areas - Reassure the individual and be comforting - Call 911. In most cases, the seizure ends before EMS arrives. However, there are cases when seizures may last over 3 to 5 minutes. Or the individual may have developed breathing difficulties or severe injuries. If a pregnant patient or a person with diabetes develops a seizure, it is prudent to call an ambulance. - Finally, if the patient does not regain full consciousness, then call EMS. Most patients will remain confused for about 45 to 90 minutes after a seizure, so you  must use judgment in calling for help. - Avoid restraints but make sure the patient is in a bed with padded side rails - Place the individual in a lateral position with the neck slightly flexed; this will help the saliva drain from the mouth and prevent the tongue from falling backward - Remove all nearby furniture and other hazards from the area - Provide verbal assurance as the individual is regaining consciousness - Provide the patient with privacy if possible - Call for help and start treatment as ordered by the caregiver   After the Seizure (Postictal Stage)  After a seizure, most patients experience confusion, fatigue, muscle pain and/or a headache. Thus, one should permit the individual to sleep. For the next few days, reassurance is essential. Being calm and helping reorient the person is also of importance.  Most seizures are painless and end spontaneously. Seizures are not harmful to others but can lead to complications such as stress on the lungs, brain and the heart. Individuals with prior lung problems may develop labored breathing and respiratory distress.     Orders Placed This Encounter  Procedures   Levetiracetam level   Phenytoin Level, Total   Vitamin D, 25-hydroxy    No orders of the defined types were placed in this encounter.   Return in about 3 months (around 05/29/2022).   Alric Ran, MD 02/26/2022, 9:16 AM  [facility] [address], [place], [place] [address] [phone]

## 2022-02-28 LAB — PHENYTOIN LEVEL, TOTAL: Phenytoin (Dilantin), Serum: 11.4 ug/mL (ref 10.0–20.0)

## 2022-02-28 LAB — LEVETIRACETAM LEVEL: Levetiracetam Lvl: <2 ug/mL — ABNORMAL LOW (ref 10.0–40.0)

## 2022-02-28 LAB — VITAMIN D 25 HYDROXY (VIT D DEFICIENCY, FRACTURES): Vit D, 25-Hydroxy: 7.9 ng/mL — ABNORMAL LOW (ref 30.0–100.0)

## 2022-03-02 ENCOUNTER — Telehealth: Payer: Self-pay

## 2022-03-02 MED ORDER — VITAMIN D3 125 MCG (5000 UT) PO TABS: 1.0000 | ORAL_TABLET | Freq: Every day | ORAL | 3 refills | Status: AC

## 2022-03-02 NOTE — Telephone Encounter (Signed)
I left the patient a VM for a return call.

## 2022-03-02 NOTE — Telephone Encounter (Signed)
I left the patient a voice mail for a return call. 

## 2022-03-02 NOTE — Progress Notes (Signed)
Please also advise him that his Vitamin D level was very low and that I will send him Vitamin D supplement

## 2022-03-02 NOTE — Progress Notes (Signed)
Please call and advise the patient that the recent labs we checked showed a non detectable Keppra. Please advise him to take his Keppra as scheduled, twice daily to avoid seizures. Please remind patient to keep any upcoming appointments or tests and to call us with any interim questions, concerns, problems or updates. Thanks,   Alric Ran, MD

## 2022-03-02 NOTE — Addendum Note (Signed)
Addended byAlric Ran on: 03/02/2022 08:32 AM   Modules accepted: Orders

## 2022-03-02 NOTE — Telephone Encounter (Signed)
-----   Message from Trevor Ran, MD sent at 03/02/2022  8:29 AM EDT ----- Please also advise him that his Vitamin D level was very low and that I will send him Vitamin D supplement

## 2022-03-03 ENCOUNTER — Telehealth: Payer: Self-pay

## 2022-03-03 MED ORDER — PHENYTOIN SODIUM EXTENDED 100 MG PO CAPS: 300.0000 mg | ORAL_CAPSULE | Freq: Every day | ORAL | 1 refills | Status: DC

## 2022-03-03 NOTE — Telephone Encounter (Signed)
I called and spoke with the patient's relative, Freda Munro (as per [facility]). She verbalized understanding and expressed appreciation for the call. She also requested that the patient's phenytoin 100 MG ER capsule be sent in for a refill. I will send in the medication as per the last office visit note.

## 2022-03-03 NOTE — Telephone Encounter (Signed)
-----   Message from Alric Ran, MD sent at 03/02/2022  8:29 AM EDT ----- Please call and advise the patient that the recent labs we checked showed a non detectable Keppra. Please advise him to take his Keppra as scheduled, twice daily to avoid seizures. Please remind patient to keep any upcoming appointments or tests and to call us with any interim questions, concerns, problems or updates. Thanks,   Alric Ran, MD

## 2022-03-03 NOTE — Telephone Encounter (Signed)
I spoke with the patient's relative, Shelia (as per [facility]). I informed her of the results of the test. She verbalized understanding of the findings and expressed appreciation for the call. All questions answered.

## 2022-03-12 NOTE — Telephone Encounter (Signed)
Contacted pharmacy to verify vit D was sent 03/02/22. They stated its OTC medication, will not be filled at pharmacy.  Pt is aware.

## 2022-03-12 NOTE — Telephone Encounter (Signed)
Trevor Lambert is calling. Stated she went [facility] to pick up Vitamin D supplement and there was nothing there for Pt. [name] is requesting medication be called in to the pharmacy.   Marland Kitchen

## 2022-05-19 ENCOUNTER — Inpatient Hospital Stay: Payer: Medicare Other | Attending: Oncology

## 2022-05-19 DIAGNOSIS — R790 Abnormal level of blood mineral: Secondary | ICD-10-CM | POA: Insufficient documentation

## 2022-05-19 DIAGNOSIS — E039 Hypothyroidism, unspecified: Secondary | ICD-10-CM | POA: Diagnosis not present

## 2022-05-19 DIAGNOSIS — E538 Deficiency of other specified B group vitamins: Secondary | ICD-10-CM | POA: Diagnosis not present

## 2022-05-19 DIAGNOSIS — F1010 Alcohol abuse, uncomplicated: Secondary | ICD-10-CM | POA: Diagnosis not present

## 2022-05-19 DIAGNOSIS — E038 Other specified hypothyroidism: Secondary | ICD-10-CM

## 2022-05-19 DIAGNOSIS — K824 Cholesterolosis of gallbladder: Secondary | ICD-10-CM | POA: Insufficient documentation

## 2022-05-19 DIAGNOSIS — D509 Iron deficiency anemia, unspecified: Secondary | ICD-10-CM | POA: Insufficient documentation

## 2022-05-19 DIAGNOSIS — D696 Thrombocytopenia, unspecified: Secondary | ICD-10-CM | POA: Diagnosis not present

## 2022-05-19 LAB — CBC WITH DIFFERENTIAL/PLATELET
Abs Immature Granulocytes: 0.03 10*3/uL (ref 0.00–0.07)
Basophils Absolute: 0.1 10*3/uL (ref 0.0–0.1)
Basophils Relative: 1 %
Eosinophils Absolute: 0.2 10*3/uL (ref 0.0–0.5)
Eosinophils Relative: 3 %
HCT: 45.7 % (ref 39.0–52.0)
Hemoglobin: 15.1 g/dL (ref 13.0–17.0)
Immature Granulocytes: 1 %
Lymphocytes Relative: 22 %
Lymphs Abs: 1.3 10*3/uL (ref 0.7–4.0)
MCH: 27.7 pg (ref 26.0–34.0)
MCHC: 33 g/dL (ref 30.0–36.0)
MCV: 83.7 fL (ref 80.0–100.0)
Monocytes Absolute: 0.7 10*3/uL (ref 0.1–1.0)
Monocytes Relative: 11 %
Neutro Abs: 3.8 10*3/uL (ref 1.7–7.7)
Neutrophils Relative %: 62 %
Platelets: 281 10*3/uL (ref 150–400)
RBC: 5.46 MIL/uL (ref 4.22–5.81)
RDW: 15.9 % — ABNORMAL HIGH (ref 11.5–15.5)
WBC: 6 10*3/uL (ref 4.0–10.5)
nRBC: 0 % (ref 0.0–0.2)

## 2022-05-19 LAB — COMPREHENSIVE METABOLIC PANEL
ALT: 16 U/L (ref 0–44)
AST: 22 U/L (ref 15–41)
Albumin: 3.6 g/dL (ref 3.5–5.0)
Alkaline Phosphatase: 163 U/L — ABNORMAL HIGH (ref 38–126)
Anion gap: 10 (ref 5–15)
BUN: 8 mg/dL (ref 6–20)
CO2: 29 mmol/L (ref 22–32)
Calcium: 8.4 mg/dL — ABNORMAL LOW (ref 8.9–10.3)
Chloride: 101 mmol/L (ref 98–111)
Creatinine, Ser: 0.85 mg/dL (ref 0.61–1.24)
GFR, Estimated: >60 mL/min (ref >60)
Glucose, Bld: 131 mg/dL — ABNORMAL HIGH (ref 70–99)
Potassium: 3.9 mmol/L (ref 3.5–5.1)
Sodium: 140 mmol/L (ref 135–145)
Total Bilirubin: 0.5 mg/dL (ref 0.3–1.2)
Total Protein: 8.3 g/dL — ABNORMAL HIGH (ref 6.5–8.1)

## 2022-05-19 LAB — FOLATE: Folate: 5.7 ng/mL — ABNORMAL LOW (ref >5.9)

## 2022-05-19 LAB — TSH: TSH: 10.441 u[IU]/mL — ABNORMAL HIGH (ref 0.350–4.500)

## 2022-05-19 LAB — IRON AND TIBC
Iron: 215 ug/dL — ABNORMAL HIGH (ref 45–182)
Saturation Ratios: 79 % — ABNORMAL HIGH (ref 17.9–39.5)
TIBC: 273 ug/dL (ref 250–450)
UIBC: 58 ug/dL

## 2022-05-19 LAB — FERRITIN: Ferritin: 43 ng/mL (ref 24–336)

## 2022-05-19 LAB — VITAMIN B12: Vitamin B-12: 1353 pg/mL — ABNORMAL HIGH (ref 180–914)

## 2022-05-19 LAB — T4, FREE: Free T4: 0.48 ng/dL — ABNORMAL LOW (ref 0.61–1.12)

## 2022-05-25 ENCOUNTER — Inpatient Hospital Stay: Payer: Medicare Other | Admitting: Oncology

## 2022-06-10 ENCOUNTER — Encounter: Payer: Self-pay | Admitting: Neurology

## 2022-06-10 ENCOUNTER — Other Ambulatory Visit: Payer: Self-pay | Admitting: Neurology

## 2022-06-10 ENCOUNTER — Ambulatory Visit (INDEPENDENT_AMBULATORY_CARE_PROVIDER_SITE_OTHER): Payer: Medicare Other | Admitting: Neurology

## 2022-06-10 VITALS — BP 98/66 | HR 64 | Ht 66.0 in | Wt 120.0 lb

## 2022-06-10 DIAGNOSIS — G40909 Epilepsy, unspecified, not intractable, without status epilepticus: Secondary | ICD-10-CM

## 2022-06-10 DIAGNOSIS — Z789 Other specified health status: Secondary | ICD-10-CM

## 2022-06-10 DIAGNOSIS — E559 Vitamin D deficiency, unspecified: Secondary | ICD-10-CM | POA: Diagnosis not present

## 2022-06-10 MED ORDER — LEVETIRACETAM ER 1000 MG PO TB24: 2000.0000 mg | ORAL_TABLET | Freq: Every day | ORAL | 11 refills | Status: DC

## 2022-06-10 NOTE — Progress Notes (Signed)
[facility]  PATIENT: Trevor Lambert DOB: [birth year]  REQUESTING CLINICIAN: No ref. provider found HISTORY FROM: Patient and mother  REASON FOR VISIT: Establish care for epilepsy    HISTORICAL  CHIEF COMPLAINT:  Chief Complaint  Patient presents with   Follow-up    RM 15 alone. Here for 3 month f/u 1 seizure ( two weeks ago) since last f/u.   INTERVAL HISTORY 06/10/2022 Trevor Lambert presents today for follow-up, he is alone today.  At last visit plan was to continue with phenytoin 300 mg daily and Keppra 1000 mg daily but he showed me an old bottle of Keppra 500 mg, and states that he has been taking 1 tablet a day.   He reports 1 breakthrough seizure 2 weeks ago, denies any provoking factors.  He reports compliance with his medication even though at last visit his antiseizure level showed a Keppra level less than 2.  Denies any side effect from the medication.  No other complaints or concerns.   INTERVAL HISTORY 02/26/2022:  Trevor Lambert presents today for follow-up.  Today he is alone.  His last visit was in July.  Since July he has been doing well, reports compliance with phenytoin 300 mg daily and Keppra 1000 mg daily.  He reported he had seizures 2 weeks ago, he fell off his bed, denies any major injury.  He did not go to the hospital.  He did not call the office to let us know.  He reports on the specific day he has not been drinking heavily, denies any recent infection or fevers.  He reports that he cut his alcohol drink to 1 beer daily.  Previously he was drinking up to 5 beers per day.   INTERVAL HISTORY 11/26/21 Trevor Lambert presents today for follow-up, he is accompanied by his mother.  He reported being compliant with his medication, he takes phenytoin 300 mg daily and Keppra extended release 500 mg daily into the 1000 mg daily.  He does also take additional Keppra 500 mg daily.  He reported he has been doing well except 2 weeks ago he did have a seizure.  He report reports  compliance with his medication.  He continues to drink reported he drink about 3X40 ounce beer 5 times a week.   HISTORY OF PRESENT ILLNESS:  This is a 62 year old man with PMHx of seizure disorder since the age of 68, alcohol abuse who is presenting to establish care. Patient reports history of seizure and medications non adherence, currently is on Phenytoin 300 mg and Keppra 500 mg TID but but again he has been taking in inconsistently. Last seizure was reported 2 weeks ago, when he believe that he had back to back seizures. Patient reports that he feel in the bathroom and has to crawl back to bed. Denies any injuries. He denies any seizure risk factor other than grandmother had seizures. He described his seizures as grand mal seizure, generalized convulsion.  He does also report long history of alcohol abuse. Reports drinking 3 to 4 days out of the week, 2 40 oz of beer. Denies any alcohol withdrawal seizures.  Currently patient is not driving.   Handedness: Right hand  Onset:Since the age of 56   Seizure Type: Generalized   Current frequency: Unclear, patient reports last seizure was 2 weeks ago   Any injuries from seizures: None   Seizure risk factors: Grandmother   Previous ASMs: Phenytoin, Levetiracetam   Currenty ASMs: Phenytoin 300 mg daily and Levetiracetam XR  1000 mg   ASMs side effects: Denies   Brain Images: Small vessel disease, no acute intracranial abnormality.   Previous EEGs: None available for review    OTHER MEDICAL CONDITIONS: Seizure, alcohol abuse, hyperlipidemia   REVIEW OF SYSTEMS: Full 14 system review of systems performed and negative with exception of: as noted in the HPI   ALLERGIES: No Known Allergies  HOME MEDICATIONS: Outpatient Medications Prior to Visit  Medication Sig Dispense Refill   albuterol (VENTOLIN HFA) 108 (90 Base) MCG/ACT inhaler      Cholecalciferol (VITAMIN D3) 125 MCG (5000 UT) TABS Take 1 tablet (5,000 Units total) by mouth  daily. 30 tablet 3   phenytoin (DILANTIN) 100 MG ER capsule Take 3 capsules (300 mg total) by mouth daily. 270 capsule 1   vitamin B-12 (CYANOCOBALAMIN) 1000 MCG tablet Take 1 tablet (1,000 mcg total) by mouth daily. 90 tablet 3   levETIRAcetam (KEPPRA XR) 500 MG 24 hr tablet Take 2 tablets (1,000 mg total) by mouth daily. (Patient not taking: Reported on 02/26/2022) 180 tablet 3   No facility-administered medications prior to visit.    PAST MEDICAL HISTORY: Past Medical History:  Diagnosis Date   Seizures (HCC)    Vitamin B12 deficiency 02/03/2019    PAST SURGICAL HISTORY: Past Surgical History:  Procedure Laterality Date   ESOPHAGOGASTRODUODENOSCOPY (EGD) WITH PROPOFOL N/A 11/07/2018   Procedure: ESOPHAGOGASTRODUODENOSCOPY (EGD) WITH PROPOFOL;  Surgeon: Wyline Mood, MD;  Location: [facility] ENDOSCOPY;  Service: Gastroenterology;  Laterality: N/A;   NO PAST SURGERIES      FAMILY HISTORY: Family History  Problem Relation Age of Onset   Cancer Neg Hx     SOCIAL HISTORY: Social History   Socioeconomic History   Marital status: Single    Spouse name: Not on file   Number of children: Not on file   Years of education: Not on file   Highest education level: Not on file  Occupational History   Not on file  Tobacco Use   Smoking status: Every Day    Packs/day: 0.50    Years: 41.00    Total pack years: 20.50    Types: Cigarettes   Smokeless tobacco: Never  Vaping Use   Vaping Use: Never used  Substance and Sexual Activity   Alcohol use: Yes    Alcohol/week: 4.0 standard drinks of alcohol    Types: 4 Cans of beer per week   Drug use: Not Currently   Sexual activity: Not on file  Other Topics Concern   Not on file  Social History Narrative   Not on file   Social Determinants of Health   Financial Resource Strain: Not on file  Food Insecurity: Not on file  Transportation Needs: Not on file  Physical Activity: Not on file  Stress: Not on file  Social Connections: Not  on file  Intimate Partner Violence: Not on file    PHYSICAL EXAM  GENERAL EXAM/CONSTITUTIONAL: Vitals:  Vitals:   06/10/22 1114  BP: 98/66  Pulse: 64  Weight: 120 lb (54.4 kg)  Height: 5\' 6"  (1.676 m)   Body mass index is 19.37 kg/m. Wt Readings from Last 3 Encounters:  06/10/22 120 lb (54.4 kg)  02/26/22 117 lb (53.1 kg)  11/26/21 118 lb (53.5 kg)   Patient is in no distress; well developed, nourished and groomed; neck is supple  EYES: Visual fields full to confrontation, Extraocular movements intacts,  No results found.  MUSCULOSKELETAL: Gait, strength, tone, movements noted in Neurologic exam below  NEUROLOGIC: MENTAL STATUS:      No data to display         awake, alert, oriented to person, place, date and month but not year. Could not tell me the current [place] president  Unable to spell PENCIL (stop school at 10 grade), unable to calculate the number of quarter in $1,75 language fluent, comprehension intact, naming intact  CRANIAL NERVE:  2nd, 3rd, 4th, 6th - visual fields full to confrontation, extraocular muscles intact, no nystagmus 5th - facial sensation symmetric 7th - facial strength symmetric 8th - hearing intact 9th - palate elevates symmetrically, uvula midline 11th - shoulder shrug symmetric 12th - tongue protrusion midline  MOTOR:  normal bulk and tone, full strength in the BUE, BLE  SENSORY:  normal and symmetric to light touch  COORDINATION:  finger-nose-finger, fine finger movements normal  REFLEXES:  deep tendon reflexes present and symmetric  GAIT/STATION:  normal   DIAGNOSTIC DATA (LABS, IMAGING, TESTING) - I reviewed patient records, labs, notes, testing and imaging myself where available.  Lab Results  Component Value Date   WBC 6.0 05/19/2022   HGB 15.1 05/19/2022   HCT 45.7 05/19/2022   MCV 83.7 05/19/2022   PLT 281 05/19/2022      Component Value Date/Time   NA 140 05/19/2022 1140   NA 141 05/01/2019 1329   K  3.9 05/19/2022 1140   CL 101 05/19/2022 1140   CO2 29 05/19/2022 1140   GLUCOSE 131 (H) 05/19/2022 1140   BUN 8 05/19/2022 1140   BUN 5 (L) 05/01/2019 1329   CREATININE 0.85 05/19/2022 1140   CALCIUM 8.4 (L) 05/19/2022 1140   PROT 8.3 (H) 05/19/2022 1140   PROT 7.3 05/01/2019 1329   ALBUMIN 3.6 05/19/2022 1140   ALBUMIN 4.2 05/01/2019 1329   AST 22 05/19/2022 1140   ALT 16 05/19/2022 1140   ALKPHOS 163 (H) 05/19/2022 1140   BILITOT 0.5 05/19/2022 1140   BILITOT 0.2 05/01/2019 1329   GFRNONAA >60 05/19/2022 1140   GFRAA 118 05/01/2019 1329   No results found for: "CHOL", "HDL", "LDLCALC", "LDLDIRECT", "TRIG" No results found for: "HGBA1C" Lab Results  Component Value Date   VITAMINB12 1,353 (H) 05/19/2022   Lab Results  Component Value Date   TSH 10.441 (H) 05/19/2022    Head CT 06/2019 1. Small left frontal scalp contusion with swelling. No underlying skull fracture. 2. Chronic appearing minimal small vessel ischemic disease of periventricular white matter. No acute intracranial abnormality. 3. No acute posttraumatic cervical spine fracture or subluxation   Routine EEG 09/02/21 This is a normal EEG recording in the waking and drowsy state. No evidence of interictal epileptiform discharges seen. A normal EEG does not exclude a diagnosis of epilepsy.     ASSESSMENT AND PLAN  62 y.o. year old male  with PMHx of seizure disorder, alcohol abuse, hyperlipidemia here for follow up. He reports compliance with his medications Phenytoin XR 300 mg daily and levetiracetam XR 1000 mg daily even tho he show [place] an old Keppra 500 mg bottle today.  Advised patient to continue with phenytoin 300 mg daily and to increase the Keppra to 2000 mg daily (New prescription sent).  I will see him again in 3 months, at that time I will obtain a Keppra level.  Advised him to contact me if he has a breakthrough seizure.   1. Seizure disorder (HCC)   2. Vitamin D deficiency   3. Alcohol use  Patient Instructions  Continue with Phenytoin 300 mg daily  Ask patient to increase Keppra to 2000 mg daily  Continue your other medications  Follow up in 3 months or sooner if worse   Per [facility] statutes, patients with seizures are not allowed to drive until they have been seizure-free for six months.  Other recommendations include using caution when using heavy equipment or power tools. Avoid working on ladders or at heights. Take showers instead of baths.  Do not swim alone.  Ensure the water temperature is not too high on the home water heater. Do not go swimming alone. Do not lock yourself in a room alone (i.e. bathroom). When caring for infants or small children, sit down when holding, feeding, or changing them to minimize risk of injury to the child in the event you have a seizure. Maintain good sleep hygiene. Avoid alcohol.  Also recommend adequate sleep, hydration, good diet and minimize stress.   During the Seizure  - First, ensure adequate ventilation and place patients on the floor on their left side  Loosen clothing around the neck and ensure the airway is patent. If the patient is clenching the teeth, do not force the mouth open with any object as this can cause severe damage - Remove all items from the surrounding that can be hazardous. The patient may be oblivious to what's happening and may not even know what he or she is doing. If the patient is confused and wandering, either gently guide him/her away and block access to outside areas - Reassure the individual and be comforting - Call 911. In most cases, the seizure ends before EMS arrives. However, there are cases when seizures may last over 3 to 5 minutes. Or the individual may have developed breathing difficulties or severe injuries. If a pregnant patient or a person with diabetes develops a seizure, it is prudent to call an ambulance. - Finally, if the patient does not regain full consciousness, then call  EMS. Most patients will remain confused for about 45 to 90 minutes after a seizure, so you must use judgment in calling for help. - Avoid restraints but make sure the patient is in a bed with padded side rails - Place the individual in a lateral position with the neck slightly flexed; this will help the saliva drain from the mouth and prevent the tongue from falling backward - Remove all nearby furniture and other hazards from the area - Provide verbal assurance as the individual is regaining consciousness - Provide the patient with privacy if possible - Call for help and start treatment as ordered by the caregiver   After the Seizure (Postictal Stage)  After a seizure, most patients experience confusion, fatigue, muscle pain and/or a headache. Thus, one should permit the individual to sleep. For the next few days, reassurance is essential. Being calm and helping reorient the person is also of importance.  Most seizures are painless and end spontaneously. Seizures are not harmful to others but can lead to complications such as stress on the lungs, brain and the heart. Individuals with prior lung problems may develop labored breathing and respiratory distress.     No orders of the defined types were placed in this encounter.   Meds ordered this encounter  Medications   levETIRAcetam 1000 MG TB24    Sig: Take 2,000 mg by mouth daily.    Dispense:  60 tablet    Refill:  11    Return in about 3 months (  around 09/09/2022).  I have spent a total of 30 minutes dedicated to this patient today, preparing to see patient, performing a medically appropriate examination and evaluation, ordering tests and/or medications and procedures, and counseling and educating the patient/family/caregiver; independently interpreting result and communicating results to the family/patient/caregiver; and documenting clinical information in the electronic medical record.  Alric Ran, MD 06/10/2022, 12:59  PM  [facility] [address], [place], [place] [address] [phone]

## 2022-06-10 NOTE — Patient Instructions (Signed)
Continue with Phenytoin 300 mg daily  Ask patient to increase Keppra to 2000 mg daily  Continue your other medications  Follow up in 3 months or sooner if worse

## 2022-06-17 ENCOUNTER — Encounter: Payer: Self-pay | Admitting: Oncology

## 2022-06-17 ENCOUNTER — Inpatient Hospital Stay (HOSPITAL_BASED_OUTPATIENT_CLINIC_OR_DEPARTMENT_OTHER): Payer: Medicare Other | Admitting: Oncology

## 2022-06-17 VITALS — BP 123/80 | HR 59 | Temp 97.1°F | Resp 16 | Wt 124.1 lb

## 2022-06-17 DIAGNOSIS — F1010 Alcohol abuse, uncomplicated: Secondary | ICD-10-CM

## 2022-06-17 DIAGNOSIS — E559 Vitamin D deficiency, unspecified: Secondary | ICD-10-CM

## 2022-06-17 DIAGNOSIS — K824 Cholesterolosis of gallbladder: Secondary | ICD-10-CM | POA: Insufficient documentation

## 2022-06-17 DIAGNOSIS — E039 Hypothyroidism, unspecified: Secondary | ICD-10-CM

## 2022-06-17 DIAGNOSIS — E538 Deficiency of other specified B group vitamins: Secondary | ICD-10-CM

## 2022-06-17 DIAGNOSIS — D696 Thrombocytopenia, unspecified: Secondary | ICD-10-CM | POA: Diagnosis not present

## 2022-06-17 MED ORDER — LEVOTHYROXINE SODIUM 50 MCG PO TABS: 50.0000 ug | ORAL_TABLET | Freq: Every day | ORAL | 2 refills | Status: DC

## 2022-06-17 NOTE — Assessment & Plan Note (Addendum)
Repeat US RUQ June/July 2024

## 2022-06-17 NOTE — Assessment & Plan Note (Signed)
continue B12 supplementation.  B12 has improved

## 2022-06-17 NOTE — Assessment & Plan Note (Signed)
High TSH and decreased T4.  Recommend levothyroxine 50mg  daily. Recommend patient to establish care with PCP. Local pcp office contact list was provided to him.

## 2022-06-17 NOTE — Assessment & Plan Note (Signed)
alcohol cessation was discussed with patient.

## 2022-06-17 NOTE — Progress Notes (Signed)
Hematology/Oncology Progress note [phone] [phone]    REASON FOR VISIT  follow-up for anemia and thrombocytopenia, vitamin D63 and folic acid deficiency.  ASSESSMENT & PLAN:   Folate deficiency Recommend patient to continue folic acid supplementation  Alcohol abuse alcohol cessation was discussed with patient.  Hypothyroidism High TSH and decreased T4.  Recommend levothyroxine 50mg  daily. Recommend patient to establish care with PCP. Local pcp office contact list was provided to him.   Vitamin B12 deficiency continue B12 supplementation.  B12 has improved  Gallbladder polyp Repeat US RUQ June/July 2024   Orders Placed This Encounter  Procedures   CBC with Differential/Platelet    Standing Status:   Future    Standing Expiration Date:   06/17/2023   Comprehensive metabolic panel    Standing Status:   Future    Standing Expiration Date:   06/17/2023   Vitamin B12    Standing Status:   Future    Standing Expiration Date:   06/18/2023   Folate    Standing Status:   Future    Standing Expiration Date:   06/18/2023    All questions were answered. The patient knows to call the clinic with any problems, questions or concerns.  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  Repeat MRI showed stable lesion. I will hold off additional image at this point.  INTERVAL HISTORY Trevor Lambert is a 62 y.o.[place] who has above oncology history reviewed by me today presented for follow up visit for management of microcytic anemia,  Reports feeling well. Drinks 1 can of beer per day. No new complaints.  He follows up with neurology for seizure. He takes all his meds.   Review of Systems  Constitutional:  Negative for  appetite change, chills, fatigue, fever and unexpected weight change.  HENT:   Negative for hearing loss and voice change.   Eyes:  Negative for eye problems and icterus.  Respiratory:  Negative for chest tightness, cough and shortness of breath.   Cardiovascular:  Negative for chest pain and leg swelling.  Gastrointestinal:  Negative for abdominal distention and abdominal pain.  Endocrine: Negative for hot flashes.  Genitourinary:  Negative for difficulty urinating, dysuria and frequency.   Musculoskeletal:  Negative for arthralgias.  Skin:  Negative for itching and rash.  Neurological:  Negative for light-headedness and numbness.  Hematological:  Negative for adenopathy. Does not bruise/bleed easily.  Psychiatric/Behavioral:  Negative for confusion.       No Known Allergies   Past Medical History:  Diagnosis Date   Seizures ([place])    Vitamin B12 deficiency 02/03/2019     Past Surgical History:  Procedure Laterality Date   ESOPHAGOGASTRODUODENOSCOPY (EGD) WITH PROPOFOL N/A 11/07/2018   Procedure: ESOPHAGOGASTRODUODENOSCOPY (EGD) WITH PROPOFOL;  Surgeon: Jonathon Bellows, MD;  Location: [facility] ENDOSCOPY;  Service: Gastroenterology;  Laterality: N/A;   NO PAST SURGERIES      Social History   Socioeconomic History   Marital status: Single    Spouse name: Not on file   Number of children: Not on file   Years of education: Not on file   Highest education level: Not on file  Occupational History   Not on file  Tobacco Use   Smoking status: Every Day    Packs/day: 0.50  Years: 41.00    Total pack years: 20.50    Types: Cigarettes   Smokeless tobacco: Never  Vaping Use   Vaping Use: Never used  Substance and Sexual Activity   Alcohol use: Yes    Alcohol/week: 4.0 standard drinks of alcohol    Types: 4 Cans of beer per week   Drug use: Not Currently   Sexual activity: Not on file  Other Topics Concern   Not on file  Social History Narrative   Not on file   Social  Determinants of Health   Financial Resource Strain: Not on file  Food Insecurity: Not on file  Transportation Needs: Not on file  Physical Activity: Not on file  Stress: Not on file  Social Connections: Not on file  Intimate Partner Violence: Not on file    Family History  Problem Relation Age of Onset   Cancer Neg Hx      Current Outpatient Medications:    albuterol (VENTOLIN HFA) 108 (90 Base) MCG/ACT inhaler, , Disp: , Rfl:    Cholecalciferol (VITAMIN D3) 125 MCG (5000 UT) TABS, Take 1 tablet (5,000 Units total) by mouth daily., Disp: 30 tablet, Rfl: 3   folic acid (FOLVITE) 093 MCG tablet, Take 800 mcg by mouth daily., Disp: , Rfl:    levETIRAcetam 1000 MG TB24, Take 2,000 mg by mouth daily., Disp: 60 tablet, Rfl: 11   levothyroxine (SYNTHROID) 50 MCG tablet, Take 1 tablet (50 mcg total) by mouth daily before breakfast., Disp: 30 tablet, Rfl: 2   phenytoin (DILANTIN) 100 MG ER capsule, Take 3 capsules (300 mg total) by mouth daily., Disp: 270 capsule, Rfl: 1   vitamin B-12 (CYANOCOBALAMIN) 1000 MCG tablet, Take 1 tablet (1,000 mcg total) by mouth daily., Disp: 90 tablet, Rfl: 3  Physical exam: ECOG 1 Vitals:   06/17/22 1043  BP: 123/80  Pulse: (!) 59  Resp: 16  Temp: (!) 97.1 F (36.2 C)  TempSrc: Tympanic  SpO2: 100%  Weight: 124 lb 1.6 oz (56.3 kg)   Physical Exam Constitutional:      General: He is not in acute distress. HENT:     Head: Normocephalic and atraumatic.  Eyes:     General: No scleral icterus.    Pupils: Pupils are equal, round, and reactive to light.  Cardiovascular:     Rate and Rhythm: Normal rate and regular rhythm.     Heart sounds: Normal heart sounds.  Pulmonary:     Effort: Pulmonary effort is normal. No respiratory distress.     Breath sounds: No wheezing.  Abdominal:     General: Bowel sounds are normal. There is no distension.     Palpations: Abdomen is soft. There is no mass.     Tenderness: There is no abdominal tenderness.   Musculoskeletal:        General: No deformity. Normal range of motion.     Cervical back: Normal range of motion and neck supple.  Skin:    General: Skin is warm and dry.     Findings: No erythema or rash.  Neurological:     Mental Status: He is alert and oriented to person, place, and time. Mental status is at baseline.     Cranial Nerves: No cranial nerve deficit.     Coordination: Coordination normal.  Psychiatric:        Mood and Affect: Mood normal.        Behavior: Behavior normal.        Thought Content: Thought  content normal.        Latest Ref Rng & Units 05/19/2022   11:40 AM  CMP  Glucose 70 - 99 mg/dL 131   BUN 6 - 20 mg/dL 8   Creatinine 0.61 - 1.24 mg/dL 0.85   Sodium 135 - 145 mmol/L 140   Potassium 3.5 - 5.1 mmol/L 3.9   Chloride 98 - 111 mmol/L 101   CO2 22 - 32 mmol/L 29   Calcium 8.9 - 10.3 mg/dL 8.4   Total Protein 6.5 - 8.1 g/dL 8.3   Total Bilirubin 0.3 - 1.2 mg/dL 0.5   Alkaline Phos 38 - 126 U/L 163   AST 15 - 41 U/L 22   ALT 0 - 44 U/L 16       Latest Ref Rng & Units 05/19/2022   11:40 AM  CBC  WBC 4.0 - 10.5 K/uL 6.0   Hemoglobin 13.0 - 17.0 g/dL 15.1   Hematocrit 39.0 - 52.0 % 45.7   Platelets 150 - 400 K/uL 281     No results found.

## 2022-06-17 NOTE — Assessment & Plan Note (Signed)
Recommend patient to continue folic acid supplementation

## 2022-07-21 ENCOUNTER — Encounter: Payer: Self-pay | Admitting: Oncology

## 2022-07-21 ENCOUNTER — Other Ambulatory Visit (HOSPITAL_COMMUNITY): Payer: Self-pay

## 2022-07-24 ENCOUNTER — Other Ambulatory Visit (HOSPITAL_COMMUNITY): Payer: Self-pay

## 2022-07-31 ENCOUNTER — Telehealth: Payer: Self-pay

## 2022-07-31 NOTE — Telephone Encounter (Signed)
Hi Megan-can you verify if a PA for this med is still needed? Thanks!

## 2022-08-03 ENCOUNTER — Other Ambulatory Visit (HOSPITAL_COMMUNITY): Payer: Self-pay

## 2022-08-03 ENCOUNTER — Encounter: Payer: Self-pay | Admitting: Oncology

## 2022-08-03 NOTE — Telephone Encounter (Signed)
Pharmacy Patient Advocate Encounter  Received notification from [facility] that the request for prior authorization for Elepsia XR 1000MG  er tablets has been denied due to See below.         Please be advised we currently do not have a Pharmacist to review denials, therefore you will need to process appeals accordingly as needed. Thanks for your support at this time.  There is an option to Eappeal on CMM with the [id number] or

## 2022-08-03 NOTE — Telephone Encounter (Signed)
Pharmacy Patient Advocate Encounter   Received notification from [place] that prior authorization for Elepsia XR 1000MG  er tablets is required/requested.   PA submitted on 08/03/2022 to (ins) [employer] via [employer] or [facility]) confirmation # [id number]  Status is pending

## 2022-08-03 NOTE — Telephone Encounter (Signed)
I left a voice mail for the patient to return our call regarding medication changes.

## 2022-08-03 NOTE — Telephone Encounter (Signed)
Since denied, we can go back to regular Keppra 750 mg twice daily.

## 2022-08-05 MED ORDER — LEVETIRACETAM 750 MG PO TABS: 750.0000 mg | ORAL_TABLET | Freq: Two times a day (BID) | ORAL | 0 refills | Status: DC

## 2022-08-05 NOTE — Addendum Note (Signed)
Addended by: Reyne Dumas on: 08/05/2022 04:31 PM   Modules accepted: Orders

## 2022-08-05 NOTE — Telephone Encounter (Signed)
Rx sent to pharmacy   

## 2022-08-05 NOTE — Telephone Encounter (Signed)
A second VM was left for the patient.

## 2022-08-07 ENCOUNTER — Other Ambulatory Visit (HOSPITAL_COMMUNITY): Payer: Self-pay

## 2022-08-16 ENCOUNTER — Encounter: Payer: Self-pay | Admitting: Oncology

## 2022-09-08 ENCOUNTER — Encounter: Payer: Self-pay | Admitting: Oncology

## 2022-09-15 ENCOUNTER — Ambulatory Visit: Payer: Medicare Other | Admitting: Neurology

## 2022-09-16 ENCOUNTER — Ambulatory Visit (INDEPENDENT_AMBULATORY_CARE_PROVIDER_SITE_OTHER): Payer: Medicare Other | Admitting: Neurology

## 2022-09-16 ENCOUNTER — Encounter: Payer: Self-pay | Admitting: Neurology

## 2022-09-16 VITALS — BP 122/81 | HR 64 | Ht 66.0 in | Wt 118.0 lb

## 2022-09-16 DIAGNOSIS — G40909 Epilepsy, unspecified, not intractable, without status epilepticus: Secondary | ICD-10-CM

## 2022-09-16 MED ORDER — LEVETIRACETAM 750 MG PO TABS: 2250.0000 mg | ORAL_TABLET | Freq: Every day | ORAL | 3 refills | Status: DC

## 2022-09-16 NOTE — Patient Instructions (Signed)
Continue with phenytoin 300 mg daily Increase Keppra to 2250 mg daily Continue your other medications Follow-up in 6 months or sooner if worse

## 2022-09-16 NOTE — Progress Notes (Signed)
[facility]  PATIENT: Trevor Lambert DOB: July 31, 1960  REQUESTING CLINICIAN: No ref. provider found HISTORY FROM: Patient and mother  REASON FOR VISIT: Establish care for epilepsy    HISTORICAL  CHIEF COMPLAINT:  Chief Complaint  Patient presents with   Follow-up    RM 13 ALONE NW:GNFA one 2 weeks ago also stated that he has back pain   INTERVAL HISTORY 09/16/2022:  Trevor Lambert presents today for follow-up, he is alone.  Last visit was in January.  At that time plan was to continue hi phenytoin and to increase his Keppra to 2000 mg. He has not have any issue taking his phenytoin but if there is always confusion about the Keppra.  Instead of taking 2000 mg he is only taking 750.  He did have a breakthrough seizure 2 weeks ago.  He report has only seizure that he had since January.   INTERVAL HISTORY 06/10/2022 Trevor Lambert presents today for follow-up, he is alone today.  At last visit plan was to continue with phenytoin 300 mg daily and Keppra 1000 mg daily but he showed me an old bottle of Keppra 500 mg, and states that he has been taking 1 tablet a day.   He reports 1 breakthrough seizure 2 weeks ago, denies any provoking factors.  He reports compliance with his medication even though at last visit his antiseizure level showed a Keppra level less than 2.  Denies any side effect from the medication.  No other complaints or concerns.   INTERVAL HISTORY 02/26/2022:  Trevor Lambert presents today for follow-up.  Today he is alone.  His last visit was in July.  Since July he has been doing well, reports compliance with phenytoin 300 mg daily and Keppra 1000 mg daily.  He reported he had seizures 2 weeks ago, he fell off his bed, denies any major injury.  He did not go to the hospital.  He did not call the office to let us know.  He reports on the specific day he has not been drinking heavily, denies any recent infection or fevers.  He reports that he cut his alcohol drink to 1 beer daily.   Previously he was drinking up to 5 beers per day.   INTERVAL HISTORY 11/26/21 Trevor Lambert presents today for follow-up, he is accompanied by his mother.  He reported being compliant with his medication, he takes phenytoin 300 mg daily and Keppra extended release 500 mg daily into the 1000 mg daily.  He does also take additional Keppra 500 mg daily.  He reported he has been doing well except 2 weeks ago he did have a seizure.  He report reports compliance with his medication.  He continues to drink reported he drink about 3X40 ounce beer 5 times a week.   HISTORY OF PRESENT ILLNESS:  This is a 62 year old man with PMHx of seizure disorder since the age of 62, alcohol abuse who is presenting to establish care. Patient reports history of seizure and medications non adherence, currently is on Phenytoin 300 mg and Keppra 500 mg TID but but again he has been taking in inconsistently. Last seizure was reported 2 weeks ago, when he believe that he had back to back seizures. Patient reports that he feel in the bathroom and has to crawl back to bed. Denies any injuries. He denies any seizure risk factor other than grandmother had seizures. He described his seizures as grand mal seizure, generalized convulsion.  He does also report long history of alcohol  abuse. Reports drinking 3 to 4 days out of the week, 2 40 oz of beer. Denies any alcohol withdrawal seizures.  Currently patient is not driving.   Handedness: Right hand  Onset:Since the age of 62   Seizure Type: Generalized   Current frequency: Unclear, patient reports last seizure was 2 weeks ago   Any injuries from seizures: None   Seizure risk factors: Grandmother   Previous ASMs: Phenytoin, Levetiracetam   Currenty ASMs: Phenytoin 300 mg daily and Levetiracetam 2250 mg daily   ASMs side effects: Denies   Brain Images: Small vessel disease, no acute intracranial abnormality.   Previous EEGs: None available for review    OTHER MEDICAL  CONDITIONS: Seizure, alcohol abuse, hyperlipidemia   REVIEW OF SYSTEMS: Full 14 system review of systems performed and negative with exception of: as noted in the HPI   ALLERGIES: No Known Allergies  HOME MEDICATIONS: Outpatient Medications Prior to Visit  Medication Sig Dispense Refill   albuterol (VENTOLIN HFA) 108 (90 Base) MCG/ACT inhaler      folic acid (FOLVITE) 800 MCG tablet Take 800 mcg by mouth daily.     levETIRAcetam (KEPPRA) 750 MG tablet Take 1 tablet (750 mg total) by mouth 2 (two) times daily. 180 tablet 0   levothyroxine (SYNTHROID) 50 MCG tablet Take 1 tablet (50 mcg total) by mouth daily before breakfast. 30 tablet 2   phenytoin (DILANTIN) 100 MG ER capsule Take 3 capsules (300 mg total) by mouth daily. 270 capsule 1   vitamin B-12 (CYANOCOBALAMIN) 1000 MCG tablet Take 1 tablet (1,000 mcg total) by mouth daily. 90 tablet 3   levETIRAcetam 1000 MG TB24 Take 2,000 mg by mouth daily. 60 tablet 11   No facility-administered medications prior to visit.    PAST MEDICAL HISTORY: Past Medical History:  Diagnosis Date   Seizures (HCC)    Vitamin B12 deficiency 02/03/2019    PAST SURGICAL HISTORY: Past Surgical History:  Procedure Laterality Date   ESOPHAGOGASTRODUODENOSCOPY (EGD) WITH PROPOFOL N/A 11/07/2018   Procedure: ESOPHAGOGASTRODUODENOSCOPY (EGD) WITH PROPOFOL;  Surgeon: Wyline Mood, MD;  Location: [facility] ENDOSCOPY;  Service: Gastroenterology;  Laterality: N/A;   NO PAST SURGERIES      FAMILY HISTORY: Family History  Problem Relation Age of Onset   Cancer Neg Hx     SOCIAL HISTORY: Social History   Socioeconomic History   Marital status: Single    Spouse name: Not on file   Number of children: 1   Years of education: Not on file   Highest education level: Not on file  Occupational History   Not on file  Tobacco Use   Smoking status: Every Day    Packs/day: 0.50    Years: 41.00    Additional pack years: 0.00    Total pack years: 20.50    Types:  Cigarettes   Smokeless tobacco: Never  Vaping Use   Vaping Use: Never used  Substance and Sexual Activity   Alcohol use: Yes    Alcohol/week: 1.0 standard drink of alcohol    Types: 1 Cans of beer per week   Drug use: Not Currently   Sexual activity: Yes    Birth control/protection: None  Other Topics Concern   Not on file  Social History Narrative   Not on file   Social Determinants of Health   Financial Resource Strain: Not on file  Food Insecurity: Not on file  Transportation Needs: Not on file  Physical Activity: Not on file  Stress: Not on file  Social Connections: Not on file  Intimate Partner Violence: Not on file    PHYSICAL EXAM  GENERAL EXAM/CONSTITUTIONAL: Vitals:  Vitals:   09/16/22 1423  BP: 122/81  Pulse: 64  Weight: 118 lb (53.5 kg)  Height: 5\' 6"  (1.676 m)   Body mass index is 19.05 kg/m. Wt Readings from Last 3 Encounters:  09/16/22 118 lb (53.5 kg)  06/17/22 124 lb 1.6 oz (56.3 kg)  06/10/22 120 lb (54.4 kg)   Patient is in no distress; well developed, nourished and groomed; neck is supple  MUSCULOSKELETAL: Gait, strength, tone, movements noted in Neurologic exam below  NEUROLOGIC: MENTAL STATUS:      No data to display         awake, alert, oriented to person, place, date and month but not year. Could not tell me the current [place] president  Unable to spell PENCIL (stop school at 10 grade), unable to calculate the number of quarter in $1,75 language fluent, comprehension intact, naming intact  CRANIAL NERVE:  2nd, 3rd, 4th, 6th - visual fields full to confrontation, extraocular muscles intact, no nystagmus 5th - facial sensation symmetric 7th - facial strength symmetric 8th - hearing intact 9th - palate elevates symmetrically, uvula midline 11th - shoulder shrug symmetric 12th - tongue protrusion midline  MOTOR:  normal bulk and tone, full strength in the BUE, BLE  SENSORY:  normal and symmetric to light touch  COORDINATION:   finger-nose-finger, fine finger movements normal  GAIT/STATION:  Walks with a cane    DIAGNOSTIC DATA (LABS, IMAGING, TESTING) - I reviewed patient records, labs, notes, testing and imaging myself where available.  Lab Results  Component Value Date   WBC 6.0 05/19/2022   HGB 15.1 05/19/2022   HCT 45.7 05/19/2022   MCV 83.7 05/19/2022   PLT 281 05/19/2022      Component Value Date/Time   NA 140 05/19/2022 1140   NA 141 05/01/2019 1329   K 3.9 05/19/2022 1140   CL 101 05/19/2022 1140   CO2 29 05/19/2022 1140   GLUCOSE 131 (H) 05/19/2022 1140   BUN 8 05/19/2022 1140   BUN 5 (L) 05/01/2019 1329   CREATININE 0.85 05/19/2022 1140   CALCIUM 8.4 (L) 05/19/2022 1140   PROT 8.3 (H) 05/19/2022 1140   PROT 7.3 05/01/2019 1329   ALBUMIN 3.6 05/19/2022 1140   ALBUMIN 4.2 05/01/2019 1329   AST 22 05/19/2022 1140   ALT 16 05/19/2022 1140   ALKPHOS 163 (H) 05/19/2022 1140   BILITOT 0.5 05/19/2022 1140   BILITOT 0.2 05/01/2019 1329   GFRNONAA >60 05/19/2022 1140   GFRAA 118 05/01/2019 1329   No results found for: "CHOL", "HDL", "LDLCALC", "LDLDIRECT", "TRIG" No results found for: "HGBA1C" Lab Results  Component Value Date   VITAMINB12 1,353 (H) 05/19/2022   Lab Results  Component Value Date   TSH 10.441 (H) 05/19/2022    Head CT 06/2019 1. Small left frontal scalp contusion with swelling. No underlying skull fracture. 2. Chronic appearing minimal small vessel ischemic disease of periventricular white matter. No acute intracranial abnormality. 3. No acute posttraumatic cervical spine fracture or subluxation   Routine EEG 09/02/21 This is a normal EEG recording in the waking and drowsy state. No evidence of interictal epileptiform discharges seen. A normal EEG does not exclude a diagnosis of epilepsy.     ASSESSMENT AND PLAN  62 y.o. year old male  with PMHx of seizure disorder, alcohol abuse, hyperlipidemia here for follow up. He reports compliance with  his medications  Phenytoin XR 300 mg daily but there is always confusion about his Keppra. He is currently on Keppra 750 mg daily, plan will be to increase it to 2250 mg daily and to continue with phenytoin 300 mg daily. I will see him in 3 months for follow up   1. Seizure disorder [facility])      Patient Instructions  Continue with phenytoin 300 mg daily Increase Keppra to 2250 mg daily Continue your other medications Follow-up in 6 months or sooner if worse  Per [facility] statutes, patients with seizures are not allowed to drive until they have been seizure-free for six months.  Other recommendations include using caution when using heavy equipment or power tools. Avoid working on ladders or at heights. Take showers instead of baths.  Do not swim alone.  Ensure the water temperature is not too high on the home water heater. Do not go swimming alone. Do not lock yourself in a room alone (i.e. bathroom). When caring for infants or small children, sit down when holding, feeding, or changing them to minimize risk of injury to the child in the event you have a seizure. Maintain good sleep hygiene. Avoid alcohol.  Also recommend adequate sleep, hydration, good diet and minimize stress.   During the Seizure  - First, ensure adequate ventilation and place patients on the floor on their left side  Loosen clothing around the neck and ensure the airway is patent. If the patient is clenching the teeth, do not force the mouth open with any object as this can cause severe damage - Remove all items from the surrounding that can be hazardous. The patient may be oblivious to what's happening and may not even know what he or she is doing. If the patient is confused and wandering, either gently guide him/her away and block access to outside areas - Reassure the individual and be comforting - Call 911. In most cases, the seizure ends before EMS arrives. However, there are cases when seizures may last over 3 to 5 minutes.  Or the individual may have developed breathing difficulties or severe injuries. If a pregnant patient or a person with diabetes develops a seizure, it is prudent to call an ambulance. - Finally, if the patient does not regain full consciousness, then call EMS. Most patients will remain confused for about 45 to 90 minutes after a seizure, so you must use judgment in calling for help. - Avoid restraints but make sure the patient is in a bed with padded side rails - Place the individual in a lateral position with the neck slightly flexed; this will help the saliva drain from the mouth and prevent the tongue from falling backward - Remove all nearby furniture and other hazards from the area - Provide verbal assurance as the individual is regaining consciousness - Provide the patient with privacy if possible - Call for help and start treatment as ordered by the caregiver   After the Seizure (Postictal Stage)  After a seizure, most patients experience confusion, fatigue, muscle pain and/or a headache. Thus, one should permit the individual to sleep. For the next few days, reassurance is essential. Being calm and helping reorient the person is also of importance.  Most seizures are painless and end spontaneously. Seizures are not harmful to others but can lead to complications such as stress on the lungs, brain and the heart. Individuals with prior lung problems may develop labored breathing and respiratory distress.     No  orders of the defined types were placed in this encounter.   No orders of the defined types were placed in this encounter.   Return in about 6 months (around 03/19/2023).  I have spent a total of 30 minutes dedicated to this patient today, preparing to see patient, performing a medically appropriate examination and evaluation, ordering tests and/or medications and procedures, and counseling and educating the patient/family/caregiver; independently interpreting result and  communicating results to the family/patient/caregiver; and documenting clinical information in the electronic medical record.  Windell Norfolk, MD 09/16/2022, 3:02 PM  [facility] [address] [place], [place] [address] [phone]

## 2022-11-03 ENCOUNTER — Encounter: Payer: Self-pay | Admitting: Oncology

## 2022-11-03 ENCOUNTER — Other Ambulatory Visit: Payer: Self-pay

## 2022-11-03 MED ORDER — FOLIC ACID 800 MCG PO TABS: 800.0000 ug | ORAL_TABLET | Freq: Every day | ORAL | 3 refills | Status: AC

## 2022-11-03 MED ORDER — LEVOTHYROXINE SODIUM 50 MCG PO TABS: 50.0000 ug | ORAL_TABLET | Freq: Every day | ORAL | 1 refills | Status: DC

## 2022-11-03 NOTE — Telephone Encounter (Signed)
Recent visit 09/16/22 can we refill his med levothyroxine and folic acid it appears that he doesn't have a pcp

## 2022-11-29 ENCOUNTER — Other Ambulatory Visit: Payer: Self-pay | Admitting: Neurology

## 2022-12-03 ENCOUNTER — Encounter: Payer: Self-pay | Admitting: Oncology

## 2022-12-09 ENCOUNTER — Encounter: Payer: Self-pay | Admitting: Oncology

## 2022-12-16 ENCOUNTER — Inpatient Hospital Stay: Payer: Medicare Other | Attending: Oncology

## 2022-12-16 DIAGNOSIS — D696 Thrombocytopenia, unspecified: Secondary | ICD-10-CM | POA: Diagnosis not present

## 2022-12-16 DIAGNOSIS — E559 Vitamin D deficiency, unspecified: Secondary | ICD-10-CM | POA: Insufficient documentation

## 2022-12-16 DIAGNOSIS — E538 Deficiency of other specified B group vitamins: Secondary | ICD-10-CM | POA: Insufficient documentation

## 2022-12-16 DIAGNOSIS — E039 Hypothyroidism, unspecified: Secondary | ICD-10-CM | POA: Diagnosis not present

## 2022-12-16 LAB — COMPREHENSIVE METABOLIC PANEL
ALT: 20 U/L (ref 0–44)
AST: 28 U/L (ref 15–41)
Albumin: 3.4 g/dL — ABNORMAL LOW (ref 3.5–5.0)
Alkaline Phosphatase: 110 U/L (ref 38–126)
Anion gap: 6 (ref 5–15)
BUN: 9 mg/dL (ref 6–20)
CO2: 25 mmol/L (ref 22–32)
Calcium: 8.2 mg/dL — ABNORMAL LOW (ref 8.9–10.3)
Chloride: 103 mmol/L (ref 98–111)
Creatinine, Ser: 0.93 mg/dL (ref 0.61–1.24)
GFR, Estimated: >60 mL/min (ref >60)
Glucose, Bld: 76 mg/dL (ref 70–99)
Potassium: 3.9 mmol/L (ref 3.5–5.1)
Sodium: 134 mmol/L — ABNORMAL LOW (ref 135–145)
Total Bilirubin: 0.3 mg/dL (ref 0.3–1.2)
Total Protein: 7 g/dL (ref 6.5–8.1)

## 2022-12-16 LAB — CBC WITH DIFFERENTIAL/PLATELET
Abs Immature Granulocytes: 0.02 10*3/uL (ref 0.00–0.07)
Basophils Absolute: 0.1 10*3/uL (ref 0.0–0.1)
Basophils Relative: 1 %
Eosinophils Absolute: 0 10*3/uL (ref 0.0–0.5)
Eosinophils Relative: 1 %
HCT: 38.2 % — ABNORMAL LOW (ref 39.0–52.0)
Hemoglobin: 12.5 g/dL — ABNORMAL LOW (ref 13.0–17.0)
Immature Granulocytes: 0 %
Lymphocytes Relative: 33 %
Lymphs Abs: 2 10*3/uL (ref 0.7–4.0)
MCH: 28.1 pg (ref 26.0–34.0)
MCHC: 32.7 g/dL (ref 30.0–36.0)
MCV: 85.8 fL (ref 80.0–100.0)
Monocytes Absolute: 0.6 10*3/uL (ref 0.1–1.0)
Monocytes Relative: 10 %
Neutro Abs: 3.2 10*3/uL (ref 1.7–7.7)
Neutrophils Relative %: 55 %
Platelets: 254 10*3/uL (ref 150–400)
RBC: 4.45 MIL/uL (ref 4.22–5.81)
RDW: 14.6 % (ref 11.5–15.5)
WBC: 5.8 10*3/uL (ref 4.0–10.5)
nRBC: 0 % (ref 0.0–0.2)

## 2022-12-16 LAB — VITAMIN B12: Vitamin B-12: 357 pg/mL (ref 180–914)

## 2022-12-16 LAB — FOLATE: Folate: 22 ng/mL (ref >5.9)

## 2022-12-21 ENCOUNTER — Inpatient Hospital Stay: Payer: Medicare Other | Attending: Oncology | Admitting: Oncology

## 2022-12-21 ENCOUNTER — Encounter: Payer: Self-pay | Admitting: Oncology

## 2022-12-21 VITALS — BP 106/71 | HR 66 | Temp 96.0°F | Resp 18 | Wt 117.3 lb

## 2022-12-21 DIAGNOSIS — E538 Deficiency of other specified B group vitamins: Secondary | ICD-10-CM | POA: Insufficient documentation

## 2022-12-21 DIAGNOSIS — D509 Iron deficiency anemia, unspecified: Secondary | ICD-10-CM | POA: Insufficient documentation

## 2022-12-21 DIAGNOSIS — F1010 Alcohol abuse, uncomplicated: Secondary | ICD-10-CM

## 2022-12-21 DIAGNOSIS — K824 Cholesterolosis of gallbladder: Secondary | ICD-10-CM

## 2022-12-21 DIAGNOSIS — D696 Thrombocytopenia, unspecified: Secondary | ICD-10-CM

## 2022-12-21 NOTE — Assessment & Plan Note (Signed)
continue B12 supplementation.  He would like to defer IM injection and repeat b12 in 6 months.

## 2022-12-21 NOTE — Assessment & Plan Note (Addendum)
Folate level is normalized.  Recommend patient to continue Folic acid daily as maintenance.

## 2022-12-21 NOTE — Assessment & Plan Note (Signed)
Recommend calcium and vitamin D supplementation.  

## 2022-12-21 NOTE — Progress Notes (Signed)
Hematology/Oncology Progress note [phone] [phone]    REASON FOR VISIT  follow-up for anemia and thrombocytopenia, vitamin B12 and folic acid deficiency.  ASSESSMENT & PLAN:   Folate deficiency Folate level is normalized.  Recommend patient to continue Folic acid daily as maintenance.   Vitamin B12 deficiency continue B12 supplementation.  He would like to defer IM injection and repeat b12 in 6 months.   Hypocalcemia Recommend calcium and vitamin D supplementation.   Alcohol abuse alcohol cessation was discussed with patient.  Gallbladder polyp Repeat US RUQ June/July 2024   Orders Placed This Encounter  Procedures  . US ABDOMEN LIMITED RUQ (LIVER/GB)    Standing Status:   Future    Standing Expiration Date:   12/21/2023    Order Specific Question:   Reason for Exam (SYMPTOM  OR DIAGNOSIS REQUIRED)    Answer:   gallbladder polyp    Order Specific Question:   Preferred imaging location?    Answer:   [facility]  . CBC with Differential (Cancer Center Only)    Standing Status:   Future    Standing Expiration Date:   12/21/2023  . CMP (Cancer Center only)    Standing Status:   Future    Standing Expiration Date:   12/21/2023  . Vitamin B12    Standing Status:   Future    Standing Expiration Date:   12/21/2023  . Folate    Standing Status:   Future    Standing Expiration Date:   12/21/2023   Follow up in 6 months.  All questions were answered. The patient knows to call the clinic with any problems, questions or concerns.  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  Repeat MRI showed stable lesion. I will hold off additional image at this point.  He follows up with neurology for seizure. He  takes all his meds.   INTERVAL HISTORY Trevor Lambert is a 62 y.o.[place] who has above oncology history reviewed by me today presented for follow up visit for management of microcytic anemia,  Reports feeling well. Drinks 1 can of beer per day. No new complaints.    Review of Systems  Constitutional:  Negative for appetite change, chills, fatigue, fever and unexpected weight change.  HENT:   Negative for hearing loss and voice change.   Eyes:  Negative for eye problems and icterus.  Respiratory:  Negative for chest tightness, cough and shortness of breath.   Cardiovascular:  Negative for chest pain and leg swelling.  Gastrointestinal:  Negative for abdominal distention and abdominal pain.  Endocrine: Negative for hot flashes.  Genitourinary:  Negative for difficulty urinating, dysuria and frequency.   Musculoskeletal:  Negative for arthralgias.  Skin:  Negative for itching and rash.  Neurological:  Negative for light-headedness and numbness.  Hematological:  Negative for adenopathy. Does not bruise/bleed easily.  Psychiatric/Behavioral:  Negative for confusion.       No Known Allergies   Past Medical History:  Diagnosis Date  . Seizures (HCC)   . Vitamin B12 deficiency 02/03/2019     Past Surgical History:  Procedure Laterality Date  . ESOPHAGOGASTRODUODENOSCOPY (EGD) WITH PROPOFOL N/A 11/07/2018   Procedure: ESOPHAGOGASTRODUODENOSCOPY (EGD) WITH PROPOFOL;  Surgeon: Wyline Mood, MD;  Location: [facility] ENDOSCOPY;  Service: Gastroenterology;  Laterality: N/A;  . NO PAST SURGERIES      Social History   Socioeconomic History  . Marital status: Single    Spouse name: Not on file  . Number of children: 1  . Years of education: Not on file  . Highest education level: Not on file  Occupational History  . Not on file  Tobacco Use  . Smoking status: Every Day    Current packs/day: 0.50    Average packs/day: 0.5 packs/day for 41.0 years (20.5 ttl pk-yrs)    Types: Cigarettes  .  Smokeless tobacco: Never  Vaping Use  . Vaping status: Never Used  Substance and Sexual Activity  . Alcohol use: Yes    Alcohol/week: 1.0 standard drink of alcohol    Types: 1 Cans of beer per week  . Drug use: Not Currently  . Sexual activity: Yes    Birth control/protection: None  Other Topics Concern  . Not on file  Social History Narrative  . Not on file   Social Determinants of Health   Financial Resource Strain: Low Risk  (12/17/2022)   Overall Financial Resource Strain (CARDIA)   . Difficulty of Paying Living Expenses: Not hard at all  Food Insecurity: No Food Insecurity (12/17/2022)   Hunger Vital Sign   . Worried About Programme researcher, broadcasting/film/video in the Last Year: Never true   . Ran Out of Food in the Last Year: Never true  Transportation Needs: No Transportation Needs (12/17/2022)   PRAPARE - Transportation   . Lack of Transportation (Medical): No   . Lack of Transportation (Non-Medical): No  Physical Activity: Insufficiently Active (12/17/2022)   Exercise Vital Sign   . Days of Exercise per Week: 2 days   . Minutes of Exercise per Session: 30 min  Stress: No Stress Concern Present (12/17/2022)   [employer] of Occupational Health - Occupational Stress Questionnaire   . Feeling of Stress : Not at all  Social Connections: Socially Isolated (12/17/2022)   Social Connection and Isolation Panel [NHANES]   . Frequency of Communication with Friends and Family: Once a week   . Frequency of Social Gatherings with Friends and Family: More than three times a week   . Attends Religious Services: Never   . Active Member of Clubs or Organizations: No   . Attends Banker Meetings: Not on file   . Marital Status: Never married  Intimate Partner Violence: Not on file    Family History  Problem Relation Age of Onset  . Cancer Neg Hx      Current Outpatient Medications:  .  albuterol (VENTOLIN HFA) 108 (90 Base) MCG/ACT inhaler, , Disp: , Rfl:  .  folic acid (FOLVITE) 800  MCG tablet, Take 1 tablet (800 mcg total) by mouth daily., Disp: 90 tablet, Rfl: 3 .  levETIRAcetam (KEPPRA) 750 MG tablet, Take 3 tablets (2,250 mg total) by mouth daily., Disp: 270 tablet, Rfl: 3 .  levothyroxine (SYNTHROID) 50 MCG tablet, Take 1 tablet (50 mcg total) by mouth daily before breakfast., Disp: 90 tablet, Rfl: 1 .  phenytoin (DILANTIN) 100 MG ER capsule, TAKE 3 CAPSULES BY MOUTH DAILY., Disp: 270 capsule, Rfl: 1 .  vitamin B-12 (CYANOCOBALAMIN) 1000 MCG tablet, Take 1 tablet (1,000 mcg total) by mouth daily., Disp: 90 tablet, Rfl: 3  Physical exam: ECOG 1 Vitals:   12/21/22 1023  BP: 106/71  Pulse: 66  Resp: 18  Temp: (!) 96 F (35.6 C)  TempSrc: Tympanic  SpO2: 100%  Weight: 117 lb 4.8 oz (53.2 kg)   Physical Exam Constitutional:      General: He is not in acute distress. HENT:     Head: Normocephalic and atraumatic.  Eyes:     General: No scleral icterus.    Pupils: Pupils are equal, round, and reactive to light.  Cardiovascular:     Rate and Rhythm: Normal rate and regular rhythm.     Heart sounds: Normal heart sounds.  Pulmonary:     Effort: Pulmonary effort is normal. No respiratory distress.     Breath sounds: No wheezing.  Abdominal:     General: Bowel sounds are normal. There is no distension.     Palpations: Abdomen is soft. There is no mass.     Tenderness: There is no abdominal tenderness.  Musculoskeletal:        General: No deformity. Normal range of motion.     Cervical back: Normal range of motion and neck supple.  Skin:    General: Skin is warm and dry.     Findings: No erythema or rash.  Neurological:     Mental Status: He is alert and oriented to person, place, and time. Mental status is at baseline.     Cranial Nerves: No cranial nerve deficit.     Coordination: Coordination normal.  Psychiatric:        Mood and Affect: Mood normal.        Behavior: Behavior normal.        Thought Content: Thought content normal.       Latest Ref  Rng & Units 12/16/2022   10:27 AM  CMP  Glucose 70 - 99 mg/dL 76   BUN 6 - 20 mg/dL 9   Creatinine 1.88 - 4.16 mg/dL 6.06   Sodium 301 - 601 mmol/L 134   Potassium 3.5 - 5.1 mmol/L 3.9   Chloride 98 - 111 mmol/L 103   CO2 22 - 32 mmol/L 25   Calcium 8.9 - 10.3 mg/dL 8.2   Total Protein 6.5 - 8.1 g/dL 7.0   Total Bilirubin 0.3 - 1.2 mg/dL 0.3   Alkaline Phos 38 - 126 U/L 110   AST 15 - 41 U/L 28   ALT 0 - 44 U/L 20       Latest Ref Rng & Units 12/16/2022   10:27 AM  CBC  WBC 4.0 - 10.5 K/uL 5.8   Hemoglobin 13.0 - 17.0 g/dL 09.3   Hematocrit 23.5 - 52.0 % 38.2   Platelets 150 - 400 K/uL 254     No results found.

## 2022-12-21 NOTE — Assessment & Plan Note (Signed)
Repeat US RUQ June/July 2024

## 2022-12-21 NOTE — Assessment & Plan Note (Signed)
alcohol cessation was discussed with patient.

## 2022-12-22 ENCOUNTER — Encounter: Payer: Self-pay | Admitting: Physician Assistant

## 2022-12-22 ENCOUNTER — Ambulatory Visit (INDEPENDENT_AMBULATORY_CARE_PROVIDER_SITE_OTHER): Payer: Medicare Other | Admitting: Physician Assistant

## 2022-12-22 VITALS — BP 120/72 | HR 52 | Temp 97.2°F | Ht 67.0 in | Wt 118.3 lb

## 2022-12-22 DIAGNOSIS — R569 Unspecified convulsions: Secondary | ICD-10-CM

## 2022-12-22 DIAGNOSIS — E538 Deficiency of other specified B group vitamins: Secondary | ICD-10-CM

## 2022-12-22 DIAGNOSIS — E559 Vitamin D deficiency, unspecified: Secondary | ICD-10-CM

## 2022-12-22 DIAGNOSIS — Z7689 Persons encountering health services in other specified circumstances: Secondary | ICD-10-CM

## 2022-12-22 DIAGNOSIS — E039 Hypothyroidism, unspecified: Secondary | ICD-10-CM

## 2022-12-22 DIAGNOSIS — Z136 Encounter for screening for cardiovascular disorders: Secondary | ICD-10-CM

## 2022-12-22 DIAGNOSIS — Z720 Tobacco use: Secondary | ICD-10-CM

## 2022-12-22 DIAGNOSIS — E038 Other specified hypothyroidism: Secondary | ICD-10-CM | POA: Diagnosis not present

## 2022-12-22 DIAGNOSIS — F1010 Alcohol abuse, uncomplicated: Secondary | ICD-10-CM | POA: Diagnosis not present

## 2022-12-22 NOTE — Progress Notes (Unsigned)
New patient visit  Patient: Trevor Lambert   DOB: [birth year]   62 y.o. Male  MRN: [id number] Visit Date: 12/22/2022  Today's healthcare provider: Debera Lat, PA-C   Chief Complaint  Patient presents with   Establish Care    Presents for routine checkup   Subjective    Trevor Lambert is a 62 y.o. male who presents today as a new patient to establish care.   Discussed the use of AI scribe software for clinical note transcription with the patient, who gave verbal consent to proceed.  History of Present Illness   The patient, with a complex medical history, presents for a routine check-up. He has a history of seizures, for which he is under the care of a neurologist. The patient reports no recent seizures, suggesting that his condition is well-controlled with the current regimen of seizure medications, including Keppra and Dilantin.  The patient also has a vitamin B12 deficiency, for which he is taking supplements. He has been prescribed Synthroid, indicating a history of thyroid issues, possibly subclinical hypothyroidism. The patient also takes folic acid, suggesting a history of folate deficiency.  The patient has a history of alcohol use and smoking. He expresses a desire to quit but has not yet sought help from a specialist.  The patient's family history includes diabetes and heart failure. He does not report any personal history of these conditions.     Past Medical History:  Diagnosis Date   Seizures (HCC)    Vitamin B12 deficiency 02/03/2019   Past Surgical History:  Procedure Laterality Date   ESOPHAGOGASTRODUODENOSCOPY (EGD) WITH PROPOFOL N/A 11/07/2018   Procedure: ESOPHAGOGASTRODUODENOSCOPY (EGD) WITH PROPOFOL;  Surgeon: Wyline Mood, MD;  Location: [facility] END[facility]COPY;  Service: Gastroenterology;  Laterality: N/A;   NO PAST SURGERIES     Family Status  Relation Name Status   Mother  Alive   Father  Alive   Neg Hx  (Not Specified)  No partnership data on file   Family  History  Problem Relation Age of Onset   Cancer Neg Hx    Social History   Socioeconomic History   Marital status: Single    Spouse name: Not on file   Number of children: 1   Years of education: Not on file   Highest education level: Not on file  Occupational History   Not on file  Tobacco Use   Smoking status: Every Day    Current packs/day: 0.50    Average packs/day: 0.5 packs/day for 41.0 years (20.5 ttl pk-yrs)    Types: Cigarettes   Smokeless tobacco: Never  Vaping Use   Vaping status: Never Used  Substance and Sexual Activity   Alcohol use: Yes    Alcohol/week: 1.0 standard drink of alcohol    Types: 1 Cans of beer per week   Drug use: Not Currently   Sexual activity: Yes    Birth control/protection: None  Other Topics Concern   Not on file  Social History Narrative   Not on file   Social Determinants of Health   Financial Resource Strain: Low Risk  (12/17/2022)   Overall Financial Resource Strain (CARDIA)    Difficulty of Paying Living Expenses: Not hard at all  Food Insecurity: No Food Insecurity (12/17/2022)   Hunger Vital Sign    Worried About Running Out of Food in the Last Year: Never true    Ran Out of Food in the Last Year: Never true  Transportation Needs: No Transportation Needs (12/17/2022)   PRAPARE -  Administrator, Civil Service (Medical): No    Lack of Transportation (Non-Medical): No  Physical Activity: Insufficiently Active (12/17/2022)   Exercise Vital Sign    Days of Exercise per Week: 2 days    Minutes of Exercise per Session: 30 min  Stress: No Stress Concern Present (12/17/2022)   [employer] of Occupational Health - Occupational Stress Questionnaire    Feeling of Stress : Not at all  Social Connections: Socially Isolated (12/17/2022)   Social Connection and Isolation Panel [NHANES]    Frequency of Communication with Friends and Family: Once a week    Frequency of Social Gatherings with Friends and Family: More than three  times a week    Attends Religious Services: Never    Database administrator or Organizations: No    Attends Engineer, structural: Not on file    Marital Status: Never married   Outpatient Medications Prior to Visit  Medication Sig   albuterol (VENTOLIN HFA) 108 (90 Base) MCG/ACT inhaler    folic acid (FOLVITE) 800 MCG tablet Take 1 tablet (800 mcg total) by mouth daily.   levETIRAcetam (KEPPRA) 750 MG tablet Take 3 tablets (2,250 mg total) by mouth daily.   levothyroxine (SYNTHROID) 50 MCG tablet Take 1 tablet (50 mcg total) by mouth daily before breakfast.   phenytoin (DILANTIN) 100 MG ER capsule TAKE 3 CAPSULES BY MOUTH DAILY.   vitamin B-12 (CYANOCOBALAMIN) 1000 MCG tablet Take 1 tablet (1,000 mcg total) by mouth daily.   No facility-administered medications prior to visit.   No Known Allergies   There is no immunization history on file for this patient.  Health Maintenance  Topic Date Due   Medicare Annual Wellness (AWV)  Never done   DTaP/Tdap/Td (1 - Tdap) Never done   Colonoscopy  Never done   Lung Cancer Screening  Never done   Zoster Vaccines- Shingrix (1 of 2) Never done   COVID-19 Vaccine (1 - 2023-24 season) Never done   INFLUENZA VACCINE  12/17/2022   Hepatitis C Screening  Completed   HIV Screening  Completed   HPV VACCINES  Aged Out    Patient Care Team: Pardue, Monico Blitz, DO as PCP - General (Family Medicine)  Review of Systems  All other systems reviewed and are negative.  Except see HPI       Objective    BP 120/72 (BP Location: Left Arm, Patient Position: Sitting, Cuff Size: Normal)   Pulse (!) 52   Temp (!) 97.2 F (36.2 C) (Oral)   Ht 5\' 7"  (1.702 m)   Wt 118 lb 4.8 oz (53.7 kg)   SpO2 99%   BMI 18.53 kg/m     Physical Exam Vitals reviewed.  Constitutional:      General: He is not in acute distress.    Appearance: Normal appearance. He is not diaphoretic.  HENT:     Head: Normocephalic and atraumatic.  Eyes:     General:  No scleral icterus.    Conjunctiva/sclera: Conjunctivae normal.  Cardiovascular:     Rate and Rhythm: Normal rate and regular rhythm.     Pulses: Normal pulses.     Heart sounds: Normal heart sounds. No murmur heard. Pulmonary:     Effort: Pulmonary effort is normal. No respiratory distress.     Breath sounds: Normal breath sounds. No wheezing or rhonchi.  Musculoskeletal:     Cervical back: Neck supple.     Right lower leg: No edema.  Left lower leg: No edema.  Lymphadenopathy:     Cervical: No cervical adenopathy.  Skin:    General: Skin is warm and dry.     Findings: No rash.  Neurological:     Mental Status: He is alert and oriented to person, place, and time. Mental status is at baseline.  Psychiatric:        Mood and Affect: Mood normal.        Behavior: Behavior normal.     Depression Screen    12/22/2022   11:19 AM  PHQ 2/9 Scores  PHQ - 2 Score 2  PHQ- 9 Score 5   No results found for any visits on 12/22/22.  Assessment & Plan        Seizure Disorder Chronic condition Well controlled on Phenytoin and Keppra as prescribed by neurologist Dr. Windell Norfolk. No recent seizures reported. -Continue current seizure medications as prescribed.  Vitamin B12 Deficiency Vit B12 on 12/16/22 was 357 Currently on supplementation. Recent labs show B12 within normal range but with history of deficiency. -Continue current B12 supplementation. Managed by Dr. Rickard Patience  Subclinical Hypothyroidism TSH was 10+  7 mo ago and T4 was 0.48 On Synthroid. No recent thyroid function tests available. -Continue Synthroid as prescribed.  Anemia Recent labs show low hemoglobin of 12/5 on 12/16/22 and low folate level. Patient is on folic acid supplementation. -Continue folic acid supplementation. Managed by Dr. Rickard Patience  Alcohol Use/Tobacco use Patient admits to drinking and smoking. Expressed interest in reducing intake. -Encourage continued efforts to reduce alcohol and tobacco  use.  General Health Maintenance -Order lipid panel due to history of statin use and family history of diabetes. -Schedule annual wellness visit to address preventive care needs including colonoscopy and lung cancer screening. -Follow-up appointment in three months to review labs and discuss chronic conditions.      Encounter to establish care Welcomed to our clinic Reviewed past medical hx, social hx, family hx and surgical hx Pt advised to send all vaccination records or screening   Return in about 6 weeks (around 02/02/2023) for AW, then CPE.    The patient was advised to call back or seek an in-person evaluation if the symptoms worsen or if the condition fails to improve as anticipated.  I discussed the assessment and treatment plan with the patient. The patient was provided an opportunity to ask questions and all were answered. The patient agreed with the plan and demonstrated an understanding of the instructions.  I, Debera Lat, PA-C have reviewed all documentation for this visit. The documentation on  12/22/22  for the exam, diagnosis, procedures, and orders are all accurate and complete.  Debera Lat, [facility], MMS [facility] [phone] (phone) [phone] (fax)  [facility]

## 2022-12-23 DIAGNOSIS — Z720 Tobacco use: Secondary | ICD-10-CM | POA: Insufficient documentation

## 2022-12-24 ENCOUNTER — Ambulatory Visit
Admission: RE | Admit: 2022-12-24 | Discharge: 2022-12-24 | Disposition: A | Discharge status: Home / Self Care | Payer: Medicare Other | Source: Ambulatory Visit | Attending: Oncology | Admitting: Oncology

## 2022-12-24 DIAGNOSIS — K824 Cholesterolosis of gallbladder: Secondary | ICD-10-CM | POA: Insufficient documentation

## 2022-12-30 ENCOUNTER — Ambulatory Visit: Payer: Medicare Other

## 2023-01-04 ENCOUNTER — Encounter: Payer: Self-pay | Admitting: Physician Assistant

## 2023-01-12 ENCOUNTER — Encounter: Payer: Self-pay | Admitting: Family Medicine

## 2023-01-12 ENCOUNTER — Ambulatory Visit (INDEPENDENT_AMBULATORY_CARE_PROVIDER_SITE_OTHER): Payer: Medicare Other | Admitting: Family Medicine

## 2023-01-12 ENCOUNTER — Other Ambulatory Visit: Payer: Self-pay | Admitting: Family Medicine

## 2023-01-12 VITALS — BP 117/84 | HR 66 | Ht 63.0 in | Wt 119.0 lb

## 2023-01-12 DIAGNOSIS — R569 Unspecified convulsions: Secondary | ICD-10-CM | POA: Diagnosis not present

## 2023-01-12 DIAGNOSIS — E559 Vitamin D deficiency, unspecified: Secondary | ICD-10-CM

## 2023-01-12 DIAGNOSIS — F1010 Alcohol abuse, uncomplicated: Secondary | ICD-10-CM

## 2023-01-12 DIAGNOSIS — E538 Deficiency of other specified B group vitamins: Secondary | ICD-10-CM

## 2023-01-12 DIAGNOSIS — Z720 Tobacco use: Secondary | ICD-10-CM

## 2023-01-12 DIAGNOSIS — E039 Hypothyroidism, unspecified: Secondary | ICD-10-CM

## 2023-01-12 DIAGNOSIS — Z0000 Encounter for general adult medical examination without abnormal findings: Secondary | ICD-10-CM

## 2023-01-12 DIAGNOSIS — E611 Iron deficiency: Secondary | ICD-10-CM

## 2023-01-12 DIAGNOSIS — Z1211 Encounter for screening for malignant neoplasm of colon: Secondary | ICD-10-CM

## 2023-01-12 NOTE — Progress Notes (Unsigned)
Complete physical exam   Patient: Trevor Lambert   DOB: 26-Sep-1960   62 y.o. Male  MRN: [id number] Visit Date: 01/12/2023  Today's healthcare provider: Sherlyn Hay, DO   Chief Complaint  Patient presents with   Annual Exam    Right leg pain for a while    Subjective    Trevor Lambert is a 62 y.o. male who presents today for a complete physical exam.  He reports consuming a general and no-pork  diet.  He walks around the house for exercise (intentional additional walking)  He generally feels well. He reports sleeping fairly well. He does have additional problems to discuss today.  HPI HPI     Annual Exam    Additional comments: Right leg pain for a while       Last edited by Clois Comber on 01/12/2023  1:17 PM.      Larey Seat at home after the last time he was here. Fell sideways and both legs started hurting the next morning. They hurt in the posterior knee and on upper, lateral shin bilaterally.   - Had a seizure and fell. Did not seek medical treatment. Just got up and went to sleep.  - Has had a total of 2 seizures since January 2024  - Patient is currently taking ONLY 1000 mg (two 500 mg tablets) every day.  He is following the instructions on the bottle he brought with him today.   Smoking 1/2 ppd (reports slowing down recently), previously 1 ppd since he was 18-18 Yo.  Sister-in-law helps him get medications (lives next door).    Past Medical History:  Diagnosis Date   Seizures (HCC)    Vitamin B12 deficiency 02/03/2019   Past Surgical History:  Procedure Laterality Date   ESOPHAGOGASTRODUODENOSCOPY (EGD) WITH PROPOFOL N/A 11/07/2018   Procedure: ESOPHAGOGASTRODUODENOSCOPY (EGD) WITH PROPOFOL;  Surgeon: Wyline Mood, MD;  Location: [facility] ENDOSCOPY;  Service: Gastroenterology;  Laterality: N/A;   NO PAST SURGERIES     Social History   Socioeconomic History   Marital status: Single    Spouse name: Not on file   Number of children: 1   Years of education:  Not on file   Highest education level: Not on file  Occupational History   Not on file  Tobacco Use   Smoking status: Every Day    Current packs/day: 0.50    Average packs/day: 0.5 packs/day for 41.0 years (20.5 ttl pk-yrs)    Types: Cigarettes   Smokeless tobacco: Never  Vaping Use   Vaping status: Never Used  Substance and Sexual Activity   Alcohol use: Yes    Alcohol/week: 1.0 standard drink of alcohol    Types: 1 Cans of beer per week   Drug use: Not Currently   Sexual activity: Yes    Birth control/protection: None  Other Topics Concern   Not on file  Social History Narrative   Not on file   Social Determinants of Health   Financial Resource Strain: Low Risk  (12/17/2022)   Overall Financial Resource Strain (CARDIA)    Difficulty of Paying Living Expenses: Not hard at all  Food Insecurity: No Food Insecurity (12/17/2022)   Hunger Vital Sign    Worried About Running Out of Food in the Last Year: Never true    Ran Out of Food in the Last Year: Never true  Transportation Needs: No Transportation Needs (12/17/2022)   PRAPARE - Administrator, Civil Service (Medical): No  Lack of Transportation (Non-Medical): No  Physical Activity: Insufficiently Active (12/17/2022)   Exercise Vital Sign    Days of Exercise per Week: 2 days    Minutes of Exercise per Session: 30 min  Stress: No Stress Concern Present (12/17/2022)   [employer] of Occupational Health - Occupational Stress Questionnaire    Feeling of Stress : Not at all  Social Connections: Socially Isolated (12/17/2022)   Social Connection and Isolation Panel [NHANES]    Frequency of Communication with Friends and Family: Once a week    Frequency of Social Gatherings with Friends and Family: More than three times a week    Attends Religious Services: Never    Database administrator or Organizations: No    Attends Engineer, structural: Not on file    Marital Status: Never married  Catering manager  Violence: Not on file   Family Status  Relation Name Status   Mother  Alive   Father  Alive   Neg Hx  (Not Specified)  No partnership data on file   Family History  Problem Relation Age of Onset   Cancer Neg Hx    No Known Allergies  Patient Care Team: Naseem Adler, Monico Blitz, DO as PCP - General (Family Medicine)   Medications: Outpatient Medications Prior to Visit  Medication Sig   albuterol (VENTOLIN HFA) 108 (90 Base) MCG/ACT inhaler    folic acid (FOLVITE) 800 MCG tablet Take 1 tablet (800 mcg total) by mouth daily.   levETIRAcetam (KEPPRA) 750 MG tablet Take 3 tablets (2,250 mg total) by mouth daily.   levothyroxine (SYNTHROID) 50 MCG tablet Take 1 tablet (50 mcg total) by mouth daily before breakfast.   phenytoin (DILANTIN) 100 MG ER capsule TAKE 3 CAPSULES BY MOUTH DAILY.   vitamin B-12 (CYANOCOBALAMIN) 1000 MCG tablet Take 1 tablet (1,000 mcg total) by mouth daily.   No facility-administered medications prior to visit.    Review of Systems  Constitutional:  Negative for appetite change, chills, fatigue and fever.  HENT:  Negative for congestion, ear pain, hearing loss, nosebleeds and trouble swallowing.   Eyes:  Negative for pain and visual disturbance.  Respiratory:  Negative for cough, chest tightness and shortness of breath.   Cardiovascular:  Negative for chest pain, palpitations and leg swelling.  Gastrointestinal:  Negative for abdominal pain, blood in stool, constipation, diarrhea, nausea and vomiting.  Endocrine: Negative for polydipsia, polyphagia and polyuria.  Genitourinary:  Negative for dysuria and flank pain.  Musculoskeletal:  Positive for back pain. Negative for arthralgias, joint swelling, myalgias and neck stiffness.  Skin:  Negative for color change, rash and wound.  Neurological:  Positive for seizures. Negative for dizziness, tremors, speech difficulty, weakness, light-headedness and headaches.  Psychiatric/Behavioral:  Negative for behavioral problems,  confusion, decreased concentration, dysphoric mood and sleep disturbance. The patient is not nervous/anxious.   All other systems reviewed and are negative.     Objective    BP 117/84 (BP Location: Left Arm, Patient Position: Sitting, Cuff Size: Normal)   Pulse 66   Ht 5\' 3"  (1.6 m)   Wt 119 lb (54 kg)   SpO2 100%   BMI 21.08 kg/m    Physical Exam Vitals reviewed.  Constitutional:      General: He is not in acute distress.    Appearance: Normal appearance. He is well-developed. He is not diaphoretic.  HENT:     Head: Normocephalic and atraumatic.     Mouth/Throat:     Mouth: Mucous  membranes are moist.     Pharynx: Oropharynx is clear. No oropharyngeal exudate.  Eyes:     General: No scleral icterus.    Conjunctiva/sclera: Conjunctivae normal.     Pupils: Pupils are equal, round, and reactive to light.     Comments: Evidence of tobacco deposits in the patient's sclera.  Arcus senilis bilaterally  Neck:     Thyroid: No thyromegaly.  Cardiovascular:     Rate and Rhythm: Normal rate and regular rhythm.     Pulses: Normal pulses.     Heart sounds: Normal heart sounds. No murmur heard. Pulmonary:     Effort: Pulmonary effort is normal. No respiratory distress.     Breath sounds: Normal breath sounds. No wheezing or rales.  Abdominal:     General: There is no distension.     Palpations: Abdomen is soft. There is no mass.     Tenderness: There is no abdominal tenderness.  Musculoskeletal:        General: No deformity.     Cervical back: Neck supple.     Right lower leg: No edema.     Left lower leg: No edema.  Lymphadenopathy:     Cervical: No cervical adenopathy.  Skin:    General: Skin is warm and dry.     Findings: No rash.  Neurological:     Mental Status: He is alert and oriented to person, place, and time. Mental status is at baseline.     Gait: Gait abnormal (uses cane).  Psychiatric:        Mood and Affect: Mood normal.        Behavior: Behavior normal.         Thought Content: Thought content normal.      Last depression screening scores    01/12/2023    1:25 PM 12/22/2022   11:19 AM  PHQ 2/9 Scores  PHQ - 2 Score 0 2  PHQ- 9 Score 2 5   Last fall risk screening    12/22/2022   11:22 AM  Fall Risk   Falls in the past year? 1  Number falls in past yr: 1  Injury with Fall? 1   Last Audit-C alcohol use screening    12/17/2022    9:08 PM  Alcohol Use Disorder Test (AUDIT)  1. How often do you have a drink containing alcohol? 4  2. How many drinks containing alcohol do you have on a typical day when you are drinking? 0  3. How often do you have six or more drinks on one occasion? 3  AUDIT-C Score 7  4. How often during the last year have you found that you were not able to stop drinking once you had started? 0  5. How often during the last year have you failed to do what was normally expected from you because of drinking? 0  6. How often during the last year have you needed a first drink in the morning to get yourself going after a heavy drinking session? 0  7. How often during the last year have you had a feeling of guilt of remorse after drinking? 0  8. How often during the last year have you been unable to remember what happened the night before because you had been drinking? 0  9. Have you or someone else been injured as a result of your drinking? 0  10. Has a relative or friend or a doctor or another health worker been concerned about your drinking or  suggested you cut down? 0  Alcohol Use Disorder Identification Test Final Score (AUDIT) 7   A score of 3 or more in women, and 4 or more in men indicates increased risk for alcohol abuse, EXCEPT if all of the points are from question 1   No results found for any visits on 01/12/23.  Assessment & Plan    Routine Health Maintenance and Physical Exam  Exercise Activities and Dietary recommendations  Goals   None    There is no immunization history on file for this  patient.  Health Maintenance  Topic Date Due   Medicare Annual Wellness (AWV)  Never done   DTaP/Tdap/Td (1 - Tdap) Never done   Colonoscopy  Never done   Lung Cancer Screening  Never done   Zoster Vaccines- Shingrix (1 of 2) Never done   COVID-19 Vaccine (1 - 2023-24 season) Never done   INFLUENZA VACCINE  12/17/2022   Hepatitis C Screening  Completed   HIV Screening  Completed   HPV VACCINES  Aged Out    Discussed health benefits of physical activity, and encouraged him to engage in regular exercise appropriate for his age and condition.   Annual physical exam Assessment & Plan: Physical exam fairly benign overall, except for use of cane.  Will check routine labs as noted.  Orders: -     Comprehensive metabolic panel -     Lipid panel -     CBC  Seizures (HCC) Assessment & Plan: Patient endorses seizure which occurred after his previous visit here but before his most recent visit to his neurologist.  Looking back through records, it does not appear the patient was ever on the dosage he was supposed to be on of his Keppra, though he seems to be correctly taking his 300 mg phenytoin ER.  Discussed with patient that he should be taking 4 of his 500 mg tablets of Keppra and wrote this instruction on his bottle, as I suspect he would not be able to remember to switch out the current bottle for a new bottle.  Did encourage him to have his sister-in-law pick up the most recent prescription from the patient's neurologist of 750 mg tablets (3 tablets daily) and start that once he runs out of the current bottle.  Patient expressed understanding, but this will need to be reassessed on his return.  Orders: -     Levetiracetam level -     Phenytoin level, free and total  Alcohol abuse -     VITAMIN D 25 Hydroxy (Vit-D Deficiency, Fractures) -     B12 and Folate Panel  Vitamin B12 deficiency Assessment & Plan: Based on the medications he brought with him, patient does not appear to be  taking a vitamin B12 supplement.  His most recent level was in the equivocal range.  Will recheck today.  Advised him to go ahead and restart his vitamin B12.  Orders: -     B12 and Folate Panel -     CBC  Folate deficiency Assessment & Plan: Patient is currently taking folic acid supplement; will recheck level today.  Orders: -     B12 and Folate Panel -     CBC  Vitamin D deficiency Assessment & Plan: Patient never received high-dose vitamin D and did not obtain over-the-counter vitamin D supplement.  Will recheck level today and will likely send a high-dose supplement to his pharmacy upon receiving the result, as I suspect it was still be low.  Orders: -     VITAMIN D 25 Hydroxy (Vit-D Deficiency, Fractures)  Acquired hypothyroidism Assessment & Plan: Based on the medications he brought with him today, patient has not been taking levothyroxine.  Will recheck his level today.  Advised patient to go ahead and have his sister-in-law pick up his levothyroxine from the pharmacy.  Orders: -     TSH Rfx on Abnormal to Free T4  Tobacco use Assessment & Plan: Patient has longstanding history of tobacco smoking.  Will go ahead and send referral for lung cancer screening.  Orders: -     Ambulatory Referral for Lung Cancer Scre  Colon cancer screening -     Cologuard   Return in about 6 weeks (around 02/23/2023) for thyroid,seizures.     I discussed the assessment and treatment plan with the patient  The patient was provided an opportunity to ask questions and all were answered. The patient agreed with the plan and demonstrated an understanding of the instructions.   The patient was advised to call back or seek an in-person evaluation if the symptoms worsen or if the condition fails to improve as anticipated.  Total time was 40 minutes. That includes chart review before the visit, the actual patient visit, and time spent on documentation after the visit.    Sherlyn Hay, DO   [facility] [phone] (phone) [phone] (fax)  [facility]

## 2023-01-12 NOTE — Patient Instructions (Addendum)
Please schedule an eye exam!  You need to be on: Folic acid 800 mcg 1 tablet daily Levetiracetam (Keppra) take 4 of your 500 mg tablets daily       - when you get the new 750 mg tablets, take 3 daily Levothyroxine 50 mcg 1 tablet daily Phenytoin ER 100 mg 3 capsules daily Vitamin B12 500 mcg 1 tablet (or capsule) daily   After I get your blood results back, I will likely order this for you to take as well: Vitamin D (ergocalciferol) 50,000 units (1.25 mg) 1 capsule every week       - if vitamin D is Not available from pharmacy, please purchase vitamin D 5000 units (1.25 mcg) at the pharmacy and take 5000 units (125 mcg) every day.

## 2023-01-13 DIAGNOSIS — Z0000 Encounter for general adult medical examination without abnormal findings: Secondary | ICD-10-CM | POA: Insufficient documentation

## 2023-01-13 DIAGNOSIS — E611 Iron deficiency: Secondary | ICD-10-CM | POA: Insufficient documentation

## 2023-01-13 MED ORDER — VITAMIN D (ERGOCALCIFEROL) 1.25 MG (50000 UNIT) PO CAPS
50000.0000 [IU] | ORAL_CAPSULE | ORAL | 1 refills | Status: DC
Start: 2023-01-13 — End: 2023-07-13

## 2023-01-13 NOTE — Assessment & Plan Note (Signed)
Patient never received high-dose vitamin D and did not obtain over-the-counter vitamin D supplement.  Will recheck level today and will likely send a high-dose supplement to his pharmacy upon receiving the result, as I suspect it was still be low.

## 2023-01-13 NOTE — Assessment & Plan Note (Addendum)
Patient endorses seizure which occurred after his previous visit here but before his most recent visit to his neurologist.  Looking back through records, it does not appear the patient was taking the dosage he was supposed to be on of his Keppra, though he seems to be correctly taking his 300 mg phenytoin ER.  Discussed with patient that he should be taking 4 of his 500 mg tablets of Keppra and wrote this instruction on his bottle, as I suspect he would not be able to remember to switch out the current bottle for a new bottle.  Did encourage him to have his sister-in-law pick up the most recent prescription from the patient's neurologist of 750 mg tablets (3 tablets daily) and start that once he runs out of the current bottle.  Patient expressed understanding, but this will need to be reassessed on his return.

## 2023-01-13 NOTE — Assessment & Plan Note (Signed)
Based on the medications he brought with him today, patient has not been taking levothyroxine.  Will recheck his level today.  Advised patient to go ahead and have his sister-in-law pick up his levothyroxine from the pharmacy.

## 2023-01-13 NOTE — Assessment & Plan Note (Signed)
Physical exam fairly benign overall, except for use of cane.  Will check routine labs as noted.

## 2023-01-13 NOTE — Assessment & Plan Note (Signed)
Patient is currently taking folic acid supplement; will recheck level today.

## 2023-01-13 NOTE — Assessment & Plan Note (Signed)
Patient has longstanding history of tobacco smoking.  Will go ahead and send referral for lung cancer screening.

## 2023-01-13 NOTE — Assessment & Plan Note (Signed)
Based on the medications he brought with him, patient does not appear to be taking a vitamin B12 supplement.  His most recent level was in the equivocal range.  Will recheck today.  Advised him to go ahead and restart his vitamin B12.

## 2023-01-14 LAB — COMPREHENSIVE METABOLIC PANEL
ALT: 19 IU/L (ref 0–44)
AST: 20 IU/L (ref 0–40)
Albumin: 4.1 g/dL (ref 3.8–4.9)
Alkaline Phosphatase: 135 IU/L — ABNORMAL HIGH (ref 44–121)
BUN/Creatinine Ratio: 12 (ref 10–24)
BUN: 10 mg/dL (ref 8–27)
Bilirubin Total: 0.2 mg/dL (ref 0.0–1.2)
CO2: 25 mmol/L (ref 20–29)
Calcium: 8.7 mg/dL (ref 8.6–10.2)
Chloride: 105 mmol/L (ref 96–106)
Creatinine, Ser: 0.81 mg/dL (ref 0.76–1.27)
Globulin, Total: 2.9 g/dL (ref 1.5–4.5)
Glucose: 95 mg/dL (ref 70–99)
Potassium: 4.1 mmol/L (ref 3.5–5.2)
Sodium: 141 mmol/L (ref 134–144)
Total Protein: 7 g/dL (ref 6.0–8.5)
eGFR: 101 mL/min/{1.73_m2} (ref >59)

## 2023-01-14 LAB — LIPID PANEL
Chol/HDL Ratio: 1.8 ratio (ref 0.0–5.0)
Cholesterol, Total: 134 mg/dL (ref 100–199)
HDL: 75 mg/dL (ref >39)
LDL Chol Calc (NIH): 44 mg/dL (ref 0–99)
Triglycerides: 73 mg/dL (ref 0–149)
VLDL Cholesterol Cal: 15 mg/dL (ref 5–40)

## 2023-01-14 LAB — PHENYTOIN LEVEL, FREE AND TOTAL
Phenytoin, Free: 1 ug/mL (ref 1.0–2.0)
Phenytoin, Serum: 12.3 ug/mL (ref 10.0–20.0)

## 2023-01-14 LAB — CBC
Hematocrit: 35.6 % — ABNORMAL LOW (ref 37.5–51.0)
Hemoglobin: 11.8 g/dL — ABNORMAL LOW (ref 13.0–17.7)
MCH: 27.3 pg (ref 26.6–33.0)
MCHC: 33.1 g/dL (ref 31.5–35.7)
MCV: 82 fL (ref 79–97)
Platelets: 260 10*3/uL (ref 150–450)
RBC: 4.33 x10E6/uL (ref 4.14–5.80)
RDW: 13.4 % (ref 11.6–15.4)
WBC: 6.9 10*3/uL (ref 3.4–10.8)

## 2023-01-14 LAB — B12 AND FOLATE PANEL
Folate: >20 ng/mL (ref >3.0)
Vitamin B-12: 494 pg/mL (ref 232–1245)

## 2023-01-14 LAB — LEVETIRACETAM LEVEL: Levetiracetam Lvl: 30.9 ug/mL (ref 10.0–40.0)

## 2023-01-14 LAB — T4F: T4,Free (Direct): 0.88 ng/dL (ref 0.82–1.77)

## 2023-01-14 LAB — TSH RFX ON ABNORMAL TO FREE T4: TSH: 5.3 u[IU]/mL — ABNORMAL HIGH (ref 0.450–4.500)

## 2023-01-14 LAB — VITAMIN D 25 HYDROXY (VIT D DEFICIENCY, FRACTURES): Vit D, 25-Hydroxy: 7.6 ng/mL — ABNORMAL LOW (ref 30.0–100.0)

## 2023-02-19 ENCOUNTER — Other Ambulatory Visit: Payer: Self-pay | Admitting: Family Medicine

## 2023-02-19 DIAGNOSIS — Z1212 Encounter for screening for malignant neoplasm of rectum: Secondary | ICD-10-CM

## 2023-02-19 DIAGNOSIS — Z1211 Encounter for screening for malignant neoplasm of colon: Secondary | ICD-10-CM

## 2023-02-22 ENCOUNTER — Ambulatory Visit: Payer: Medicare Other | Admitting: Family Medicine

## 2023-03-03 ENCOUNTER — Ambulatory Visit: Payer: Medicare Other | Admitting: Family Medicine

## 2023-03-03 ENCOUNTER — Telehealth: Payer: Self-pay | Admitting: *Deleted

## 2023-03-03 ENCOUNTER — Encounter: Payer: Self-pay | Admitting: Family Medicine

## 2023-03-03 VITALS — BP 120/77 | HR 70 | Ht 63.0 in | Wt 118.4 lb

## 2023-03-03 DIAGNOSIS — R413 Other amnesia: Secondary | ICD-10-CM | POA: Diagnosis not present

## 2023-03-03 DIAGNOSIS — Z91148 Patient's other noncompliance with medication regimen for other reason: Secondary | ICD-10-CM

## 2023-03-03 DIAGNOSIS — Z0000 Encounter for general adult medical examination without abnormal findings: Secondary | ICD-10-CM | POA: Insufficient documentation

## 2023-03-03 DIAGNOSIS — Z0001 Encounter for general adult medical examination with abnormal findings: Secondary | ICD-10-CM

## 2023-03-03 DIAGNOSIS — K7030 Alcoholic cirrhosis of liver without ascites: Secondary | ICD-10-CM | POA: Diagnosis not present

## 2023-03-03 DIAGNOSIS — Z716 Tobacco abuse counseling: Secondary | ICD-10-CM | POA: Insufficient documentation

## 2023-03-03 NOTE — Assessment & Plan Note (Signed)
Noted.  No acute concerns.  Continue to monitor.

## 2023-03-03 NOTE — Assessment & Plan Note (Signed)
Patient has very poor cognition and what seems to be limited social support.  He endorses some help with medications by his sister-in-law, but his medications have been consistently incorrect in reviewing documentation over the past year or more.  He only brought in four medications with him today, when there are several others he should be taking, including a couple he brought in last time that were not in his bag today.  He is a very unreliable historian, but, again, does not seem to have the social support he needs to be effectively addressing his care needs.

## 2023-03-03 NOTE — Assessment & Plan Note (Signed)
Counseled patient on available resources for smoking cessation.   Patient remains precontemplative at this time.

## 2023-03-03 NOTE — Patient Instructions (Addendum)
Contact [facility] to schedule lung cancer screening (referral has already been sent)  -  [phone]   Please pick up your levothyroxine and new keppra from the pharmacy!  I recommend you get the shingles, tetanus and covid vaccines; please contact your pharmacy to have these administered!  Please do your cologuard test (the box you received).

## 2023-03-03 NOTE — Progress Notes (Signed)
  Care Coordination  Outreach Note  03/03/2023 Name: Trevor Lambert MRN: [id number] DOB: February 06, 1961   Care Coordination Outreach Attempts: An unsuccessful telephone outreach was attempted today to offer the patient information about available care coordination services.  Follow Up Plan:  Additional outreach attempts will be made to offer the patient care coordination information and services.   Encounter Outcome:  No Answer  Burman Nieves, CCMA Care Coordination Care Guide Direct Dial: [phone]

## 2023-03-03 NOTE — Progress Notes (Signed)
Annual Wellness Visit     Patient: Trevor Lambert, Male    DOB: [birth year], 62 y.o.   MRN: [id number] Visit Date: 03/03/2023  Today's Provider: Sherlyn Hay, DO   Chief Complaint  Patient presents with   Follow-up    6 week follow up    Subjective    Trevor Lambert is a 62 y.o. male who presents today for his Annual Wellness Visit. He reports consuming a general and no-pork  diet.  He walks around the house for exercise (intentional additional walking)  He generally feels well. He reports sleeping fairly well. He does have additional problems to discuss today.  HPI Last annual exam: 01/12/2023   Lives by himself but sister-in-law helps with medications some. Tobacco - smokes 1 ppd, since 62 Yo.   - is not interested in quitting at this time  Did Cologuard screening?  No.  He received the box but has not yet done the screening.    The 10-year ASCVD risk score (Arnett DK, et al., 2019) is: 9.8%   Values used to calculate the score:     Age: 39 years     Sex: Male     Is Non-Hispanic African American: Yes     Diabetic: No     Tobacco smoker: Yes     Systolic Blood Pressure: 120 mmHg     Is BP treated: No     HDL Cholesterol: 75 mg/dL     Total Cholesterol: 134 mg/dL   Medications: Outpatient Medications Prior to Visit  Medication Sig   albuterol (VENTOLIN HFA) 108 (90 Base) MCG/ACT inhaler    folic acid (FOLVITE) 800 MCG tablet Take 1 tablet (800 mcg total) by mouth daily.   levETIRAcetam (KEPPRA) 750 MG tablet Take 3 tablets (2,250 mg total) by mouth daily.   phenytoin (DILANTIN) 100 MG ER capsule TAKE 3 CAPSULES BY MOUTH DAILY.   vitamin B-12 (CYANOCOBALAMIN) 1000 MCG tablet Take 1 tablet (1,000 mcg total) by mouth daily.   levothyroxine (SYNTHROID) 50 MCG tablet Take 1 tablet (50 mcg total) by mouth daily before breakfast. (Patient not taking: Reported on 03/03/2023)   Vitamin D, Ergocalciferol, (DRISDOL) 1.25 MG (50000 UNIT) CAPS capsule Take 1 capsule  (50,000 Units total) by mouth every 7 (seven) days. (Patient not taking: Reported on 03/03/2023)   No facility-administered medications prior to visit.    No Known Allergies  Patient Care Team: Gwynneth Fabio, Monico Blitz, DO as PCP - General (Family Medicine)  Review of Systems  Respiratory: Negative.  Negative for cough, shortness of breath and wheezing.   Cardiovascular:  Negative for chest pain, palpitations and leg swelling.  Gastrointestinal:  Negative for abdominal pain, constipation, diarrhea, nausea and vomiting.  Neurological:  Positive for headaches (occasional; attributes to people getting on his nerves). Negative for weakness.         Objective    Vitals: BP 120/77   Pulse 70   Ht 5\' 3"  (1.6 m)   Wt 118 lb 6.4 oz (53.7 kg)   SpO2 100%   BMI 20.97 kg/m      Physical Exam Vitals and nursing note reviewed.  Constitutional:      General: He is not in acute distress.    Appearance: Normal appearance.  HENT:     Head: Normocephalic and atraumatic.  Eyes:     General: No scleral icterus.    Conjunctiva/sclera: Conjunctivae normal.  Cardiovascular:     Rate and Rhythm: Normal rate.  Pulmonary:  Effort: Pulmonary effort is normal.  Neurological:     Mental Status: He is alert and oriented to person, place, and time. Mental status is at baseline.  Psychiatric:        Mood and Affect: Mood normal.        Behavior: Behavior normal.     Most recent functional status assessment:    03/03/2023   12:08 PM  In your present state of health, do you have any difficulty performing the following activities:  Hearing? 0  Vision? 0  Difficulty concentrating or making decisions? 0  Walking or climbing stairs? 1  Dressing or bathing? 0  Doing errands, shopping? 0   Most recent fall risk assessment:    03/03/2023   11:53 AM  Fall Risk   Falls in the past year? 0  Number falls in past yr: 0  Injury with Fall? 0    Most recent depression screenings:    03/03/2023    11:54 AM 01/12/2023    1:25 PM  PHQ 2/9 Scores  PHQ - 2 Score 2 0  PHQ- 9 Score 2 2   Most recent cognitive screening:    03/03/2023   12:00 PM  6CIT Screen  What Year? 0 points  What month? 0 points  What time? 0 points  Count back from 20 4 points  Months in reverse 4 points  Repeat phrase 6 points  Total Score 14 points   Most recent Audit-C alcohol use screening    03/03/2023   12:07 PM  Alcohol Use Disorder Test (AUDIT)  1. How often do you have a drink containing alcohol? 3  2. How many drinks containing alcohol do you have on a typical day when you are drinking? 0  3. How often do you have six or more drinks on one occasion? 0  AUDIT-C Score 3  4. How often during the last year have you found that you were not able to stop drinking once you had started? 0  5. How often during the last year have you failed to do what was normally expected from you because of drinking? 0  6. How often during the last year have you needed a first drink in the morning to get yourself going after a heavy drinking session? 0  7. How often during the last year have you had a feeling of guilt of remorse after drinking? 0  8. How often during the last year have you been unable to remember what happened the night before because you had been drinking? 0  9. Have you or someone else been injured as a result of your drinking? 0  10. Has a relative or friend or a doctor or another health worker been concerned about your drinking or suggested you cut down? 0  Alcohol Use Disorder Identification Test Final Score (AUDIT) 3   A score of 3 or more in women, and 4 or more in men indicates increased risk for alcohol abuse, EXCEPT if all of the points are from question 1   No results found for any visits on 03/03/23.  Assessment & Plan     Annual wellness visit done today including the all of the following: Reviewed patient's Family Medical History Reviewed and updated list of patient's medical  providers Assessment of cognitive impairment was done Assessed patient's functional ability Established a written schedule for health screening services Health Risk Assessent Completed and Reviewed Patient is not currently on any opioid medications. Referral for lung cancer screening  sent at prior visit. Patient has Cologuard kit at home; counseled him on how to place stool in it and send it back to the company.  Exercise Activities and Dietary recommendations  Goals      Medication Adherence Maintained     Evidence-based guidance:  Develop a complete and accurate medication list including those prescribed and over-the-counter, those taken only occasionally and those not taken by mouth such as injections, inhalers, ointments or creams and drops.  Review all medications to determine if patient or caregiver knows why the medications are given and if taken as prescribed.  Complete or review a medication adherence assessment including barriers to medication adherence.  Arrange and encourage counseling and medication review by pharmacist.  Assess barriers to medication adherence.  Manage poor understanding or health literacy by using easy to understand language, teach-back, visual aids and teaching only 2 or 3 points at a time.  Assess presence of side effects; provide suggestions to manage or reduce side effects.  Consult with provider and/or pharmacist regarding substitute medication, changing dose, simplification of regimen or safe discontinuation of some medications.  Encourage the use of medication reminders such as clock or cell phone alarm, color coding, pillboxes for am/pm and days of the week, pharmacy refill reminder, auto-refill system or mail-order option.  Assist with resources when cost is a barrier; refer to prescription assistance programs; confirm that generics are prescribed whenever possible; consider 90-day prescriptions to reduce copay cost; synchronize refills.  Provide help  to complete medication assistance applications or health insurance forms as needed.  Complete a follow-up call 2 to 3 weeks after medication self-management plan developed; assess adherence and understanding, as well as listen to patient or caregiver concerns; amend plan as needed.  Provide frequent follow-up providing motivation, encouragement and support when medication nonadherence is identified.   Notes:          There is no immunization history on file for this patient.  Health Maintenance  Topic Date Due   DTaP/Tdap/Td (1 - Tdap) Never done   Fecal DNA (Cologuard)  Never done   Lung Cancer Screening  Never done   Zoster Vaccines- Shingrix (1 of 2) Never done   COVID-19 Vaccine (1 - 2023-24 season) Never done   INFLUENZA VACCINE  08/16/2023 (Originally 12/17/2022)   Medicare Annual Wellness (AWV)  03/02/2024   Hepatitis C Screening  Completed   HIV Screening  Completed   HPV VACCINES  Aged Out     Discussed health benefits of physical activity, and encouraged him to engage in regular exercise appropriate for his age and condition.    Encounter for subsequent annual wellness visit (AWV) in Medicare patient  Memory impairment Assessment & Plan: Patient has very poor cognition and what seems to be limited social support.  He endorses some help with medications by his sister-in-law, but his medications have been consistently incorrect in reviewing documentation over the past year or more.  He only brought in four medications with him today, when there are several others he should be taking, including a couple he brought in last time that were not in his bag today.  He is a very unreliable historian, but, again, does not seem to have the social support he needs to be effectively addressing his care needs.  Orders: -     AMB Referral [facility]BCI Care Management  Medication noncompliance due to cognitive impairment -     AMB Referral VBCI Care Management  Alcoholic cirrhosis of liver  without ascites (  HCC) Assessment & Plan: Noted. No acute concerns.  Continue to monitor.     Return in about 4 months (around 07/04/2023).     I discussed the assessment and treatment plan with the patient  The patient was provided an opportunity to ask questions and all were answered. The patient agreed with the plan and demonstrated an understanding of the instructions.   The patient was advised to call back or seek an in-person evaluation if the symptoms worsen or if the condition fails to improve as anticipated.    Sherlyn Hay, DO  [facility] [phone] (phone) [phone] (fax)  [facility]

## 2023-03-05 NOTE — Progress Notes (Unsigned)
  Care Coordination  Outreach Note  03/05/2023 Name: Trevor Lambert MRN: [id number] DOB: 1960-09-12   Care Coordination Outreach Attempts: A second unsuccessful outreach was attempted today to offer the patient with information about available care coordination services.  Follow Up Plan:  Additional outreach attempts will be made to offer the patient care coordination information and services.   Encounter Outcome:  No Answer  Burman Nieves, CCMA Care Coordination Care Guide Direct Dial: [phone]

## 2023-03-08 NOTE — Progress Notes (Signed)
  Care Coordination  Outreach Note  03/08/2023 Name: Adon Senn MRN: [id number] DOB: 06-23-1960   Care Coordination Outreach Attempts: A third unsuccessful outreach was attempted today to offer the patient with information about available care coordination services.  Follow Up Plan:  No further outreach attempts will be made at this time. We have been unable to contact the patient to offer or enroll patient in care coordination services  Encounter Outcome:  No Answer  Burman Nieves, [facility] Care Coordination Care Guide Direct Dial: [phone]

## 2023-03-09 ENCOUNTER — Ambulatory Visit: Payer: Medicare Other

## 2023-03-09 ENCOUNTER — Telehealth: Payer: Self-pay | Admitting: *Deleted

## 2023-03-09 VITALS — BP 108/60 | Ht 63.0 in | Wt 120.1 lb

## 2023-03-09 DIAGNOSIS — Z1211 Encounter for screening for malignant neoplasm of colon: Secondary | ICD-10-CM

## 2023-03-09 DIAGNOSIS — Z0000 Encounter for general adult medical examination without abnormal findings: Secondary | ICD-10-CM

## 2023-03-09 DIAGNOSIS — Z0100 Encounter for examination of eyes and vision without abnormal findings: Secondary | ICD-10-CM

## 2023-03-09 NOTE — Progress Notes (Signed)
  Care Coordination   Note   03/09/2023 Name: Trevor Lambert MRN: [id number] DOB: 27-Aug-1960  Trevor Lambert is a 62 y.o. year old male who sees Pardue, Monico Blitz, DO for primary care. I reached out to Caryl Comes by phone today to offer care coordination services.  [name] was given information about Care Coordination services today including:   The Care Coordination services include support from the care team which includes your Nurse Coordinator, Clinical Social Worker, or Pharmacist.  The Care Coordination team is here to help remove barriers to the health concerns and goals most important to you. Care Coordination services are voluntary, and the patient may decline or stop services at any time by request to their care team member.   Care Coordination Consent Status: Patient agreed to services and verbal consent obtained.   Follow up plan:  Telephone appointment with care coordination team member scheduled for:  03/16/2023  Encounter Outcome:  Patient Scheduled from referral   Burman Nieves, [facility] Care Coordination Care Guide Direct Dial: [phone]

## 2023-03-09 NOTE — Patient Instructions (Signed)
Trevor Lambert , Thank you for taking time to come for your Medicare Wellness Visit. I appreciate your ongoing commitment to your health goals. Please review the following plan we discussed and let me know if I can assist you in the future.   Referrals/Orders/Follow-Ups/Clinician Recommendations:   You have been referred to establish care with a new eye care provider. If you have not heard from them in the next week, please call to schedule your appointment  [facility] [address] [place] [address] Ph [phone]   You have been referred to see a gastroenterologist to discuss a colon cancer screening. If you haven't heard from that office in 7 business days, please call them to schedule your appointment.   [facility] [address], [place], [place] [address]  [address] PHONE: [phone] [email or web address]  An order has already been placed for lung cancer screening. Please call for appointment if you do not hear from them in 7 days.  [facility] [address], [place], [place] [address]  [phone]   This is a list of the screening recommended for you and due dates:  Health Maintenance  Topic Date Due   Cologuard (Stool DNA test)  Never done   Screening for Lung Cancer  Never done   COVID-19 Vaccine (1 - 2023-24 season) 03/25/2023*   Zoster (Shingles) Vaccine (1 of 2) 06/09/2023*   Flu Shot  08/16/2023*   DTaP/Tdap/Td vaccine (1 - Tdap) 03/08/2024*   Medicare Annual Wellness Visit  03/08/2024   Hepatitis C Screening  Completed   HIV Screening  Completed   HPV Vaccine  Aged Out  *Topic was postponed. The date shown is not the original due date.    Advanced directives: (Declined) Advance directive discussed with you today. Even though you declined this today, please call our office should you change your mind, and we can give you the proper paperwork for you to  fill out.  Next Medicare Annual Wellness Visit scheduled for next year: Yes 03/13/24 @ 9:45am in person

## 2023-03-09 NOTE — Progress Notes (Signed)
Subjective:   Trevor Lambert is a 62 y.o. male who presents for an Initial Medicare Annual Wellness Visit.  Visit Complete: In person  Patient Medicare AWV questionnaire was completed by the patient on 03/03/2023; I have confirmed that all information answered by patient is correct and no changes since this date. Cardiac Risk Factors include: advanced age (>2men, >14 women);male gender;sedentary lifestyle    Objective:    Today's Vitals   03/09/23 0935 03/09/23 0936  BP: 108/60   Weight: 120 lb 1.6 oz (54.5 kg)   Height: 5\' 3"  (1.6 m)   PainSc:  5    Body mass index is 21.27 kg/m.     03/09/2023    9:56 AM 10/31/2021   11:53 AM 04/28/2021    9:41 AM 12/20/2020    9:49 AM 09/13/2020    9:58 AM 06/11/2020   11:12 AM 10/30/2019    1:28 PM  Advanced Directives  Does Patient Have a Medical Advance Directive? No No No No No No No  Would patient like information on creating a medical advance directive?   No - Patient declined No - Patient declined No - Patient declined      Current Medications (verified) Outpatient Encounter Medications as of 03/09/2023  Medication Sig   albuterol (VENTOLIN HFA) 108 (90 Base) MCG/ACT inhaler    folic acid (FOLVITE) 800 MCG tablet Take 1 tablet (800 mcg total) by mouth daily.   levETIRAcetam (KEPPRA) 750 MG tablet Take 3 tablets (2,250 mg total) by mouth daily.   levothyroxine (SYNTHROID) 50 MCG tablet Take 1 tablet (50 mcg total) by mouth daily before breakfast.   phenytoin (DILANTIN) 100 MG ER capsule TAKE 3 CAPSULES BY MOUTH DAILY.   vitamin B-12 (CYANOCOBALAMIN) 1000 MCG tablet Take 1 tablet (1,000 mcg total) by mouth daily.   Vitamin D, Ergocalciferol, (DRISDOL) 1.25 MG (50000 UNIT) CAPS capsule Take 1 capsule (50,000 Units total) by mouth every 7 (seven) days.   No facility-administered encounter medications on file as of 03/09/2023.    Allergies (verified) Patient has no known allergies.   History: Past Medical History:  Diagnosis  Date   Seizures (HCC)    Vitamin B12 deficiency 02/03/2019   Past Surgical History:  Procedure Laterality Date   ESOPHAGOGASTRODUODENOSCOPY (EGD) WITH PROPOFOL N/A 11/07/2018   Procedure: ESOPHAGOGASTRODUODENOSCOPY (EGD) WITH PROPOFOL;  Surgeon: Wyline Mood, MD;  Location: [facility] ENDOSCOPY;  Service: Gastroenterology;  Laterality: N/A;   NO PAST SURGERIES     Family History  Problem Relation Age of Onset   Cancer Neg Hx    Social History   Socioeconomic History   Marital status: Single    Spouse name: Not on file   Number of children: 1   Years of education: Not on file   Highest education level: Not on file  Occupational History   Not on file  Tobacco Use   Smoking status: Every Day    Current packs/day: 0.50    Average packs/day: 0.5 packs/day for 41.0 years (20.5 ttl pk-yrs)    Types: Cigarettes   Smokeless tobacco: Never  Vaping Use   Vaping status: Never Used  Substance and Sexual Activity   Alcohol use: Yes    Alcohol/week: 1.0 standard drink of alcohol    Types: 1 Cans of beer per week   Drug use: Not Currently   Sexual activity: Yes    Birth control/protection: None  Other Topics Concern   Not on file  Social History Narrative   Not on file  Social Determinants of Health   Financial Resource Strain: Low Risk  (03/09/2023)   Overall Financial Resource Strain (CARDIA)    Difficulty of Paying Living Expenses: Not hard at all  Food Insecurity: No Food Insecurity (03/09/2023)   Hunger Vital Sign    Worried About Running Out of Food in the Last Year: Never true    Ran Out of Food in the Last Year: Never true  Transportation Needs: No Transportation Needs (03/09/2023)   PRAPARE - Administrator, Civil Service (Medical): No    Lack of Transportation (Non-Medical): No  Physical Activity: Inactive (03/09/2023)   Exercise Vital Sign    Days of Exercise per Week: 0 days    Minutes of Exercise per Session: 0 min  Stress: No Stress Concern Present  (03/09/2023)   [employer] of Occupational Health - Occupational Stress Questionnaire    Feeling of Stress : Not at all  Social Connections: Socially Isolated (03/09/2023)   Social Connection and Isolation Panel [NHANES]    Frequency of Communication with Friends and Family: Once a week    Frequency of Social Gatherings with Friends and Family: More than three times a week    Attends Religious Services: Never    Database administrator or Organizations: No    Attends Engineer, structural: Never    Marital Status: Never married    Tobacco Counseling Ready to quit: No Counseling given: Not Answered   Clinical Intake:  Pre-visit preparation completed: No  Pain : 0-10 Pain Score: 5  Pain Location: Leg Pain Orientation: Right Pain Descriptors / Indicators: Aching Pain Onset: More than a month ago Pain Frequency: Intermittent Pain Relieving Factors: walking  Pain Relieving Factors: walking  BMI - recorded: 21.27 Nutritional Status: BMI of 19-24  Normal Nutritional Risks: None Diabetes: No  How often do you need to have someone help you when you read instructions, pamphlets, or other written materials from your doctor or pharmacy?: 1 - Never  Interpreter Needed?: No  Comments: lives alone Information entered by :: [name],LPN   Activities of Daily Living    03/09/2023    9:56 AM 03/03/2023   12:08 PM  In your present state of health, do you have any difficulty performing the following activities:  Hearing? 0 0  Vision? 0 0  Difficulty concentrating or making decisions? 0 0  Walking or climbing stairs? 1 1  Dressing or bathing? 0 0  Doing errands, shopping? 0 0  Preparing Food and eating ? N   Using the Toilet? N   In the past six months, have you accidently leaked urine? N   Do you have problems with loss of bowel control? N   Managing your Medications? N   Managing your Finances? N   Housekeeping or managing your Housekeeping? N      Patient Care Team: Sherlyn Hay, DO as PCP - General (Family Medicine)  Indicate any recent Medical Services you may have received from other than [facility] providers in the past year (date may be approximate).     Assessment:   This is a routine wellness examination for Trevor Lambert.  Hearing/Vision screen Hearing Screening - Comments:: Pt says his hearing is good Vision Screening - Comments:: Pt says he can see well No eye md-never eye exam   Goals Addressed             This Visit's Progress    Medication Adherence Maintained   Not on track  Evidence-based guidance:  Develop a complete and accurate medication list including those prescribed and over-the-counter, those taken only occasionally and those not taken by mouth such as injections, inhalers, ointments or creams and drops.  Review all medications to determine if patient or caregiver knows why the medications are given and if taken as prescribed.  Complete or review a medication adherence assessment including barriers to medication adherence.  Arrange and encourage counseling and medication review by pharmacist.  Assess barriers to medication adherence.  Manage poor understanding or health literacy by using easy to understand language, teach-back, visual aids and teaching only 2 or 3 points at a time.  Assess presence of side effects; provide suggestions to manage or reduce side effects.  Consult with provider and/or pharmacist regarding substitute medication, changing dose, simplification of regimen or safe discontinuation of some medications.  Encourage the use of medication reminders such as clock or cell phone alarm, color coding, pillboxes for am/pm and days of the week, pharmacy refill reminder, auto-refill system or mail-order option.  Assist with resources when cost is a barrier; refer to prescription assistance programs; confirm that generics are prescribed whenever possible; consider 90-day prescriptions to reduce  copay cost; synchronize refills.  Provide help to complete medication assistance applications or health insurance forms as needed.  Complete a follow-up call 2 to 3 weeks after medication self-management plan developed; assess adherence and understanding, as well as listen to patient or caregiver concerns; amend plan as needed.  Provide frequent follow-up providing motivation, encouragement and support when medication nonadherence is identified.   Notes:        Depression Screen    03/09/2023    9:50 AM 03/03/2023   11:54 AM 01/12/2023    1:25 PM 12/22/2022   11:19 AM  PHQ 2/9 Scores  PHQ - 2 Score 0 2 0 2  PHQ- 9 Score  2 2 5     Fall Risk    03/09/2023    9:40 AM 03/03/2023   11:53 AM 12/22/2022   11:22 AM  Fall Risk   Falls in the past year? 1 0 1  Comment fell when had seizure x2    Number falls in past yr: 1 0 1  Injury with Fall? 0 0 1  Risk for fall due to : History of fall(s);Impaired balance/gait;Other (Comment)    Risk for fall due to: Comment pt has seizures    Follow up Falls prevention discussed;Education provided      MEDICARE RISK AT HOME: Medicare Risk at Home Any stairs in or around the home?: Yes If so, are there any without handrails?: Yes Home free of loose throw rugs in walkways, pet beds, electrical cords, etc?: Yes Adequate lighting in your home to reduce risk of falls?: Yes Life alert?: No (encouraged to call customer service of ins co to apply) Use of a cane, walker or w/c?: Yes Grab bars in the bathroom?: No Shower chair or bench in shower?: No Elevated toilet seat or a handicapped toilet?: No  TIMED UP AND GO:  Was the test performed? Yes  Length of time to ambulate 10 feet: 15 sec Gait slow and steady with assistive device    Cognitive Function:        03/09/2023    9:57 AM 03/03/2023   12:00 PM  6CIT Screen  What Year? 0 points 0 points  What month? 0 points 0 points  What time? 0 points 0 points  Count back from 20 0 points 4  points  Months in reverse 4 points 4 points  Repeat phrase 6 points 6 points  Total Score 10 points 14 points    Immunizations  There is no immunization history on file for this patient.  TDAP status: Up to date  Flu Vaccine status: Declined, Education has been provided regarding the importance of this vaccine but patient still declined. Advised may receive this vaccine at local pharmacy or Health Dept. Aware to provide a copy of the vaccination record if obtained from local pharmacy or Health Dept. Verbalized acceptance and understanding.  Pneumococcal vaccine status: Declined,  Education has been provided regarding the importance of this vaccine but patient still declined. Advised may receive this vaccine at local pharmacy or Health Dept. Aware to provide a copy of the vaccination record if obtained from local pharmacy or Health Dept. Verbalized acceptance and understanding.   Covid-19 vaccine status: Declined, Education has been provided regarding the importance of this vaccine but patient still declined. Advised may receive this vaccine at local pharmacy or Health Dept.or vaccine clinic. Aware to provide a copy of the vaccination record if obtained from local pharmacy or Health Dept. Verbalized acceptance and understanding.  Qualifies for Shingles Vaccine? Yes   Zostavax completed No   Shingrix Completed?: No.    Education has been provided regarding the importance of this vaccine. Patient has been advised to call insurance company to determine out of pocket expense if they have not yet received this vaccine. Advised may also receive vaccine at local pharmacy or Health Dept. Verbalized acceptance and understanding.  Screening Tests Health Maintenance  Topic Date Due   Fecal DNA (Cologuard)  Never done   Lung Cancer Screening  Never done   COVID-19 Vaccine (1 - 2023-24 season) 03/25/2023 (Originally 01/17/2023)   Zoster Vaccines- Shingrix (1 of 2) 06/09/2023 (Originally 06/29/2012)    INFLUENZA VACCINE  08/16/2023 (Originally 12/17/2022)   DTaP/Tdap/Td (1 - Tdap) 03/08/2024 (Originally 06/29/1981)   Medicare Annual Wellness (AWV)  03/08/2024   Hepatitis C Screening  Completed   HIV Screening  Completed   HPV VACCINES  Aged Out    Health Maintenance  Health Maintenance Due  Topic Date Due   Fecal DNA (Cologuard)  Never done   Lung Cancer Screening  Never done    Colorectal cancer screening: Referral to GI placed yes. Pt aware the office will call re: appt.  Lung Cancer Screening: (Low Dose CT Chest recommended if Age 25-80 years, 20 pack-year currently smoking OR have quit w/in 15years.) does qualify.   Lung Cancer Screening Referral: yes  Additional Screening:  Hepatitis C Screening: does not qualify; Completed 08/17/2018  Vision Screening: Recommended annual ophthalmology exams for early detection of glaucoma and other disorders of the eye. Is the patient up to date with their annual eye exam?  No  Who is the provider or what is the name of the office in which the patient attends annual eye exams? none If pt is not established with a provider, would they like to be referred to a provider to establish care? Yes .   Dental Screening: Recommended annual dental exams for proper oral hygiene  Diabetic Foot Exam: n/a  Community Resource Referral / Chronic Care Management: CRR required this visit?  No   CCM required this visit?  No    Plan:     I have personally reviewed and noted the following in the patient's chart:   Medical and social history Use of alcohol, tobacco or illicit drugs  Current medications and  supplements including opioid prescriptions. Patient is not currently taking opioid prescriptions. Functional ability and status Nutritional status Physical activity Advanced directives List of other physicians Hospitalizations, surgeries, and ER visits in previous 12 months Vitals Screenings to include cognitive, depression, and falls Referrals  and appointments  In addition, I have reviewed and discussed with patient certain preventive protocols, quality metrics, and best practice recommendations. A written personalized care plan for preventive services as well as general preventive health recommendations were provided to patient.     Sue Lush, LPN   25/36/6440   After Visit Summary: (MyChart) Due to this being a telephonic visit, the after visit summary with patients personalized plan was offered to patient via MyChart   Nurse Notes: Pt comes to appt with his sister -in-law and presents ambulating with a cane. He relays he is doing alright despite not taking his medications as directed. He relays he drinks alcohol 3-4 days a week.

## 2023-03-16 ENCOUNTER — Ambulatory Visit: Payer: Self-pay | Admitting: *Deleted

## 2023-03-16 DIAGNOSIS — R569 Unspecified convulsions: Secondary | ICD-10-CM

## 2023-03-16 DIAGNOSIS — R413 Other amnesia: Secondary | ICD-10-CM

## 2023-03-17 NOTE — Patient Outreach (Addendum)
  Care Coordination   Initial Visit Note   03/17/2023 Name: Trevor Lambert MRN: [id number] DOB: 31-Jul-1960  Trevor Lambert is a 62 y.o. year old male who sees Pardue, Monico Blitz, DO for primary care. I spoke with  Trevor Lambert and his sister in law by phone today.  What matters to the patients health and wellness today?  Patient's SIL requesting information related to insurance plan options. SIL confirms that she fills patient's pill box weekly and provides patient with regular reminders to take medication as prescribed. CSW to refer patient to pharmacy for assistance with medication adherence.    Goals Addressed             This Visit's Progress    Insurance plan options       Activities and task to complete in order to accomplish goals.   TASK TO ACCOMPLISH GOAL Please contact [employer] Program  to discuss plan options  [phone]         SDOH assessments and interventions completed:  Yes  SDOH Interventions Today    Flowsheet Row Most Recent Value  SDOH Interventions   Food Insecurity Interventions Intervention Not Indicated  Housing Interventions Intervention Not Indicated  Transportation Interventions Intervention Not Indicated, Patient Resources (Friends/Family)  Utilities Interventions Intervention Not Indicated        Care Coordination Interventions:  Yes, provided  Interventions Today    Flowsheet Row Most Recent Value  Chronic Disease   Chronic disease during today's visit Other  [memory impairment , seizures]  General Interventions   General Interventions Discussed/Reviewed General Interventions Discussed, Community Resources  [pt's SIL confirms assisting patient with his overall care needs-pts mother is pts financial payee-SIL ensures household bills are paid. SIL fills pill box but is not sure that pt is taking medications as prescribed]  Education Interventions   Education Provided Provided Education  Provided Verbal Education On  [employer]  [SIL expresses interest in changing medicare plan, encouraged to contact SHIP to explore plan options [phone]  Pharmacy Interventions   Pharmacy Dicussed/Reviewed Pharmacy Topics Discussed  [discussed importance and benefists of  medication adherance, encouraged patient to take medications as prescribed SIL will continue to fill pill box weekly,brother to continue to check on pt 2x per day]       Follow up plan: Follow up call scheduled for 03/31/23    Encounter Outcome:  Patient Visit Completed

## 2023-03-17 NOTE — Patient Instructions (Addendum)
Visit Information  Thank you for taking time to visit with me today. Please don't hesitate to contact me if I can be of assistance to you.   Following are the goals we discussed today:   Goals Addressed             This Visit's Progress    care coordination needs       Activities and task to complete in order to accomplish goals.   TASK TO ACCOMPLISH GOAL Please contact [employer] [facility] [facility]  to discuss plan options  [phone] Please expect a call from the Clinical Pharmacist regarding medications         Our next appointment is by telephone on 04/01/23 at 11am  Please call the care guide team at [phone] if you need to cancel or reschedule your appointment.   If you are experiencing a Mental Health or Behavioral Health Crisis or need someone to talk to, please call 911   Patient verbalizes understanding of instructions and care plan provided today and agrees to view in MyChart. Active MyChart status and patient understanding of how to access instructions and care plan via MyChart confirmed with patient.     Telephone follow up appointment with care management team member scheduled for: 04/01/23  Verna Czech, LCSW [place]  [facility] Licensed Clinical Social Worker Care Coordinator  Direct Dial: [phone]

## 2023-03-18 ENCOUNTER — Telehealth: Payer: Self-pay

## 2023-03-18 ENCOUNTER — Encounter: Payer: Self-pay | Admitting: *Deleted

## 2023-03-18 NOTE — Progress Notes (Signed)
   Care Guide Note  03/18/2023 Name: Trevor Lambert MRN: [id number] DOB: Oct 03, 1960  Referred by: Sherlyn Hay, DO Reason for referral : Care Coordination (Outreach to schedule with Pharm d )   Trevor Lambert is a 62 y.o. year old male who is a primary care patient of Sherlyn Hay, DO. Trevor Lambert was referred to the pharmacist for assistance related to  medication management  .    An unsuccessful telephone outreach was attempted today to contact the patient who was referred to the pharmacy team for assistance with medication management. Additional attempts will be made to contact the patient.   [name], RMA Care Guide [facility]  [place], [place] [address] Direct Dial: [phone] [email or web address]

## 2023-03-25 NOTE — Progress Notes (Signed)
   Care Guide Note  03/25/2023 Name: Trevor Lambert MRN: [id number] DOB: 1960/07/31  Referred by: Sherlyn Hay, DO Reason for referral : Care Coordination (Outreach to schedule with Pharm d )   Trevor Lambert is a 62 y.o. year old male who is a primary care patient of Sherlyn Hay, DO. Trevor Lambert was referred to the pharmacist for assistance related to  med mgmt  .    A second unsuccessful telephone outreach was attempted today to contact the patient who was referred to the pharmacy team for assistance with medication management. Additional attempts will be made to contact the patient.  [name], RMA Care Guide [facility]  [place], [place] [address] Direct Dial: [phone] [email or web address]

## 2023-03-30 NOTE — Progress Notes (Signed)
   Care Guide Note  03/30/2023 Name: Ovidio Preusser MRN: [id number] DOB: 1960/06/23  Referred by: Sherlyn Hay, DO Reason for referral : Care Coordination (Outreach to schedule with Pharm d )   Reyan Izzard is a 62 y.o. year old male who is a primary care patient of Sherlyn Hay, DO. Leander Valen was referred to the pharmacist for assistance related to  med mgmt  .    A third unsuccessful telephone outreach was attempted today to contact the patient who was referred to the pharmacy team for assistance with medication management. The Population Health team is pleased to engage with this patient at any time in the future upon receipt of referral and should he/she be interested in assistance from the [facility] team.   [name], RMA Care Guide [facility]  [place], [place] [address] Direct Dial: [phone] [email or web address]

## 2023-03-31 ENCOUNTER — Ambulatory Visit (INDEPENDENT_AMBULATORY_CARE_PROVIDER_SITE_OTHER): Payer: Medicare Other | Admitting: Neurology

## 2023-03-31 ENCOUNTER — Encounter: Payer: Self-pay | Admitting: Neurology

## 2023-03-31 VITALS — BP 119/71 | HR 74 | Ht 64.0 in | Wt 121.0 lb

## 2023-03-31 DIAGNOSIS — G40909 Epilepsy, unspecified, not intractable, without status epilepticus: Secondary | ICD-10-CM | POA: Diagnosis not present

## 2023-03-31 DIAGNOSIS — E559 Vitamin D deficiency, unspecified: Secondary | ICD-10-CM

## 2023-03-31 MED ORDER — PHENYTOIN SODIUM EXTENDED 100 MG PO CAPS: 300.0000 mg | ORAL_CAPSULE | Freq: Every day | ORAL | 4 refills | Status: DC

## 2023-03-31 MED ORDER — LEVETIRACETAM 750 MG PO TABS: 2250.0000 mg | ORAL_TABLET | Freq: Every day | ORAL | 4 refills | Status: DC

## 2023-03-31 NOTE — Progress Notes (Signed)
[facility]  PATIENT: Trevor Lambert DOB: [birth year]  REQUESTING CLINICIAN: No ref. provider found HISTORY FROM: Patient and mother  REASON FOR VISIT: Establish care for epilepsy    HISTORICAL  CHIEF COMPLAINT:  Chief Complaint  Patient presents with   Follow-up    Rm 13. No seizures to report and taking meds as prescribed   INTERVAL HISTORY 03/31/2023:  Trevor Lambert presents today for follow-up, last visit was in May since then he has been doing well, denies any seizure or seizure like activity.  He is on Keppra 2250 mg daily and Dilantin 300 mg daily.  He reports running out of Dilantin a month ago but has been using her old bottle of Keppra 500 mg, take an additional 2 tablets.  So basically he is taking a total of 3250 mg of Keppra.  No side effect and luckily no seizure for patient.     INTERVAL HISTORY 09/16/2022:  Trevor Lambert presents today for follow-up, he is alone.  Last visit was in January.  At that time plan was to continue hi phenytoin and to increase his Keppra to 2000 mg. He has not have any issue taking his phenytoin but if there is always confusion about the Keppra.  Instead of taking 2000 mg he is only taking 750.  He did have a breakthrough seizure 2 weeks ago.  He report has only seizure that he had since January.   INTERVAL HISTORY 06/10/2022 Trevor Lambert presents today for follow-up, he is alone today.  At last visit plan was to continue with phenytoin 300 mg daily and Keppra 1000 mg daily but he showed me an old bottle of Keppra 500 mg, and states that he has been taking 1 tablet a day.   He reports 1 breakthrough seizure 2 weeks ago, denies any provoking factors.  He reports compliance with his medication even though at last visit his antiseizure level showed a Keppra level less than 2.  Denies any side effect from the medication.  No other complaints or concerns.   INTERVAL HISTORY 02/26/2022:  Trevor Lambert presents today for follow-up.  Today he is alone.  His  last visit was in July.  Since July he has been doing well, reports compliance with phenytoin 300 mg daily and Keppra 1000 mg daily.  He reported he had seizures 2 weeks ago, he fell off his bed, denies any major injury.  He did not go to the hospital.  He did not call the office to let us know.  He reports on the specific day he has not been drinking heavily, denies any recent infection or fevers.  He reports that he cut his alcohol drink to 1 beer daily.  Previously he was drinking up to 5 beers per day.   INTERVAL HISTORY 11/26/21 Trevor Lambert presents today for follow-up, he is accompanied by his mother.  He reported being compliant with his medication, he takes phenytoin 300 mg daily and Keppra extended release 500 mg daily into the 1000 mg daily.  He does also take additional Keppra 500 mg daily.  He reported he has been doing well except 2 weeks ago he did have a seizure.  He report reports compliance with his medication.  He continues to drink reported he drink about 3X40 ounce beer 5 times a week.   HISTORY OF PRESENT ILLNESS:  This is a 62 year old man with PMHx of seizure disorder since the age of 39, alcohol abuse who is presenting to establish care. Patient reports history of  seizure and medications non adherence, currently is on Phenytoin 300 mg and Keppra 500 mg TID but but again he has been taking in inconsistently. Last seizure was reported 2 weeks ago, when he believe that he had back to back seizures. Patient reports that he feel in the bathroom and has to crawl back to bed. Denies any injuries. He denies any seizure risk factor other than grandmother had seizures. He described his seizures as grand mal seizure, generalized convulsion.  He does also report long history of alcohol abuse. Reports drinking 3 to 4 days out of the week, 2 40 oz of beer. Denies any alcohol withdrawal seizures.  Currently patient is not driving.   Handedness: Right hand  Onset:Since the age of 30   Seizure Type:  Generalized   Current frequency: Unclear, patient reports last seizure was 2 weeks ago   Any injuries from seizures: None   Seizure risk factors: Grandmother   Previous ASMs: Phenytoin, Levetiracetam   Currenty ASMs: Phenytoin 300 mg daily and Levetiracetam 2250 mg daily   ASMs side effects: Denies   Brain Images: Small vessel disease, no acute intracranial abnormality.   Previous EEGs: None available for review    OTHER MEDICAL CONDITIONS: Seizure, alcohol abuse, hyperlipidemia   REVIEW OF SYSTEMS: Full 14 system review of systems performed and negative with exception of: as noted in the HPI   ALLERGIES: No Known Allergies  HOME MEDICATIONS: Outpatient Medications Prior to Visit  Medication Sig Dispense Refill   albuterol (VENTOLIN HFA) 108 (90 Base) MCG/ACT inhaler      folic acid (FOLVITE) 800 MCG tablet Take 1 tablet (800 mcg total) by mouth daily. 90 tablet 3   levothyroxine (SYNTHROID) 50 MCG tablet Take 1 tablet (50 mcg total) by mouth daily before breakfast. 90 tablet 1   vitamin B-12 (CYANOCOBALAMIN) 1000 MCG tablet Take 1 tablet (1,000 mcg total) by mouth daily. 90 tablet 3   Vitamin D, Ergocalciferol, (DRISDOL) 1.25 MG (50000 UNIT) CAPS capsule Take 1 capsule (50,000 Units total) by mouth every 7 (seven) days. 12 capsule 1   levETIRAcetam (KEPPRA) 750 MG tablet Take 3 tablets (2,250 mg total) by mouth daily. 270 tablet 3   phenytoin (DILANTIN) 100 MG ER capsule TAKE 3 CAPSULES BY MOUTH DAILY. 270 capsule 1   No facility-administered medications prior to visit.    PAST MEDICAL HISTORY: Past Medical History:  Diagnosis Date   Seizures (HCC)    Vitamin B12 deficiency 02/03/2019    PAST SURGICAL HISTORY: Past Surgical History:  Procedure Laterality Date   ESOPHAGOGASTRODUODENOSCOPY (EGD) WITH PROPOFOL N/A 11/07/2018   Procedure: ESOPHAGOGASTRODUODENOSCOPY (EGD) WITH PROPOFOL;  Surgeon: Wyline Mood, MD;  Location: [facility] ENDOSCOPY;  Service: Gastroenterology;   Laterality: N/A;   NO PAST SURGERIES      FAMILY HISTORY: Family History  Problem Relation Age of Onset   Cancer Neg Hx     SOCIAL HISTORY: Social History   Socioeconomic History   Marital status: Single    Spouse name: Not on file   Number of children: 1   Years of education: Not on file   Highest education level: Not on file  Occupational History   Not on file  Tobacco Use   Smoking status: Every Day    Current packs/day: 0.50    Average packs/day: 0.5 packs/day for 41.0 years (20.5 ttl pk-yrs)    Types: Cigarettes   Smokeless tobacco: Never  Vaping Use   Vaping status: Never Used  Substance and Sexual Activity  Alcohol use: Yes    Alcohol/week: 1.0 standard drink of alcohol    Types: 1 Cans of beer per week   Drug use: Not Currently   Sexual activity: Yes    Birth control/protection: None  Other Topics Concern   Not on file  Social History Narrative   Not on file   Social Determinants of Health   Financial Resource Strain: Low Risk  (03/09/2023)   Overall Financial Resource Strain (CARDIA)    Difficulty of Paying Living Expenses: Not hard at all  Food Insecurity: No Food Insecurity (03/16/2023)   Hunger Vital Sign    Worried About Running Out of Food in the Last Year: Never true    Ran Out of Food in the Last Year: Never true  Transportation Needs: No Transportation Needs (03/16/2023)   PRAPARE - Administrator, Civil Service (Medical): No    Lack of Transportation (Non-Medical): No  Physical Activity: Inactive (03/09/2023)   Exercise Vital Sign    Days of Exercise per Week: 0 days    Minutes of Exercise per Session: 0 min  Stress: No Stress Concern Present (03/09/2023)   [employer] of Occupational Health - Occupational Stress Questionnaire    Feeling of Stress : Not at all  Social Connections: Socially Isolated (03/09/2023)   Social Connection and Isolation Panel [NHANES]    Frequency of Communication with Friends and Family: Once  a week    Frequency of Social Gatherings with Friends and Family: More than three times a week    Attends Religious Services: Never    Database administrator or Organizations: No    Attends Banker Meetings: Never    Marital Status: Never married  Intimate Partner Violence: Not At Risk (03/16/2023)   Humiliation, Afraid, Rape, and Kick questionnaire    Fear of Current or Ex-Partner: No    Emotionally Abused: No    Physically Abused: No    Sexually Abused: No    PHYSICAL EXAM  GENERAL EXAM/CONSTITUTIONAL: Vitals:  Vitals:   03/31/23 0955  BP: 119/71  Pulse: 74  Weight: 121 lb (54.9 kg)  Height: 5\' 4"  (1.626 m)   Body mass index is 20.77 kg/m. Wt Readings from Last 3 Encounters:  03/31/23 121 lb (54.9 kg)  03/09/23 120 lb 1.6 oz (54.5 kg)  03/03/23 118 lb 6.4 oz (53.7 kg)   Patient is in no distress; well developed, nourished and groomed; neck is supple  MUSCULOSKELETAL: Gait, strength, tone, movements noted in Neurologic exam below  NEUROLOGIC: MENTAL STATUS:      No data to display         awake, alert, oriented to person, place, date and month but not year. Could not tell me the current [place] president  Unable to spell PENCIL (stop school at 10 grade), unable to calculate the number of quarter in $1,75 language fluent, comprehension intact, naming intact  CRANIAL NERVE:  2nd, 3rd, 4th, 6th - visual fields full to confrontation, extraocular muscles intact, no nystagmus 5th - facial sensation symmetric 7th - facial strength symmetric 8th - hearing intact 9th - palate elevates symmetrically, uvula midline 11th - shoulder shrug symmetric 12th - tongue protrusion midline  MOTOR:  normal bulk and tone, full strength in the BUE, BLE  SENSORY:  normal and symmetric to light touch  COORDINATION:  finger-nose-finger, fine finger movements normal  GAIT/STATION:  Walks with a cane    DIAGNOSTIC DATA (LABS, IMAGING, TESTING) - I reviewed patient  records,  labs, notes, testing and imaging myself where available.  Lab Results  Component Value Date   WBC 6.9 01/12/2023   HGB 11.8 (L) 01/12/2023   HCT 35.6 (L) 01/12/2023   MCV 82 01/12/2023   PLT 260 01/12/2023      Component Value Date/Time   NA 141 01/12/2023 1410   K 4.1 01/12/2023 1410   CL 105 01/12/2023 1410   CO2 25 01/12/2023 1410   GLUCOSE 95 01/12/2023 1410   GLUCOSE 76 12/16/2022 1027   BUN 10 01/12/2023 1410   CREATININE 0.81 01/12/2023 1410   CALCIUM 8.7 01/12/2023 1410   PROT 7.0 01/12/2023 1410   ALBUMIN 4.1 01/12/2023 1410   AST 20 01/12/2023 1410   ALT 19 01/12/2023 1410   ALKPHOS 135 (H) 01/12/2023 1410   BILITOT 0.2 01/12/2023 1410   GFRNONAA >60 12/16/2022 1027   GFRAA 118 05/01/2019 1329   Lab Results  Component Value Date   CHOL 134 01/12/2023   HDL 75 01/12/2023   LDLCALC 44 01/12/2023   TRIG 73 01/12/2023   No results found for: "HGBA1C" Lab Results  Component Value Date   VITAMINB12 494 01/12/2023   Lab Results  Component Value Date   TSH 5.300 (H) 01/12/2023    Head CT 06/2019 1. Small left frontal scalp contusion with swelling. No underlying skull fracture. 2. Chronic appearing minimal small vessel ischemic disease of periventricular white matter. No acute intracranial abnormality. 3. No acute posttraumatic cervical spine fracture or subluxation   Routine EEG 09/02/21 This is a normal EEG recording in the waking and drowsy state. No evidence of interictal epileptiform discharges seen. A normal EEG does not exclude a diagnosis of epilepsy.     ASSESSMENT AND PLAN  62 y.o. year old male  with PMHx of seizure disorder, alcohol abuse, hyperlipidemia here for follow up. He reports compliance with his medications Phenytoin XR 300 mg daily but there is always confusion about his Keppra. He is taking Keppra 3250 mg instead of 2250 mg daily, since he ran out of his Dilantin.  Again spent additional time discussing with patient and the  correct medication which is Keppra 2250 mg and Dilantin 300 mg daily.  He voiced understanding.  I will see him in 6 months for follow-up.   1. Seizure disorder (HCC)   2. Vitamin D deficiency      Patient Instructions  Continue with Keppra 2250 mg daily Continue with Dilantin ER 300 mg daily, refill given Continue your other medications Follow-up 6 months or sooner if worse.  Per [facility] statutes, patients with seizures are not allowed to drive until they have been seizure-free for six months.  Other recommendations include using caution when using heavy equipment or power tools. Avoid working on ladders or at heights. Take showers instead of baths.  Do not swim alone.  Ensure the water temperature is not too high on the home water heater. Do not go swimming alone. Do not lock yourself in a room alone (i.e. bathroom). When caring for infants or small children, sit down when holding, feeding, or changing them to minimize risk of injury to the child in the event you have a seizure. Maintain good sleep hygiene. Avoid alcohol.  Also recommend adequate sleep, hydration, good diet and minimize stress.   During the Seizure  - First, ensure adequate ventilation and place patients on the floor on their left side  Loosen clothing around the neck and ensure the airway is patent. If the patient  is clenching the teeth, do not force the mouth open with any object as this can cause severe damage - Remove all items from the surrounding that can be hazardous. The patient may be oblivious to what's happening and may not even know what he or she is doing. If the patient is confused and wandering, either gently guide him/her away and block access to outside areas - Reassure the individual and be comforting - Call 911. In most cases, the seizure ends before EMS arrives. However, there are cases when seizures may last over 3 to 5 minutes. Or the individual may have developed breathing difficulties or  severe injuries. If a pregnant patient or a person with diabetes develops a seizure, it is prudent to call an ambulance. - Finally, if the patient does not regain full consciousness, then call EMS. Most patients will remain confused for about 45 to 90 minutes after a seizure, so you must use judgment in calling for help. - Avoid restraints but make sure the patient is in a bed with padded side rails - Place the individual in a lateral position with the neck slightly flexed; this will help the saliva drain from the mouth and prevent the tongue from falling backward - Remove all nearby furniture and other hazards from the area - Provide verbal assurance as the individual is regaining consciousness - Provide the patient with privacy if possible - Call for help and start treatment as ordered by the caregiver   After the Seizure (Postictal Stage)  After a seizure, most patients experience confusion, fatigue, muscle pain and/or a headache. Thus, one should permit the individual to sleep. For the next few days, reassurance is essential. Being calm and helping reorient the person is also of importance.  Most seizures are painless and end spontaneously. Seizures are not harmful to others but can lead to complications such as stress on the lungs, brain and the heart. Individuals with prior lung problems may develop labored breathing and respiratory distress.     No orders of the defined types were placed in this encounter.   Meds ordered this encounter  Medications   phenytoin (DILANTIN) 100 MG ER capsule    Sig: Take 3 capsules (300 mg total) by mouth daily.    Dispense:  270 capsule    Refill:  4   levETIRAcetam (KEPPRA) 750 MG tablet    Sig: Take 3 tablets (2,250 mg total) by mouth daily.    Dispense:  270 tablet    Refill:  4    Return in about 6 months (around 09/28/2023).   Windell Norfolk, MD 03/31/2023, 11:18 AM  [facility] [address], [address] [place], [place]  [address] [phone]

## 2023-03-31 NOTE — Patient Instructions (Addendum)
Continue with Keppra 2250 mg daily Continue with Dilantin ER 300 mg daily, refill given Continue your other medications Follow-up 6 months or sooner if worse.

## 2023-04-01 ENCOUNTER — Encounter: Payer: Self-pay | Admitting: *Deleted

## 2023-06-21 ENCOUNTER — Inpatient Hospital Stay: Payer: Medicare Other | Attending: Oncology

## 2023-06-21 DIAGNOSIS — D509 Iron deficiency anemia, unspecified: Secondary | ICD-10-CM | POA: Insufficient documentation

## 2023-06-21 DIAGNOSIS — D696 Thrombocytopenia, unspecified: Secondary | ICD-10-CM

## 2023-06-21 DIAGNOSIS — E538 Deficiency of other specified B group vitamins: Secondary | ICD-10-CM | POA: Insufficient documentation

## 2023-06-21 LAB — CBC WITH DIFFERENTIAL (CANCER CENTER ONLY)
Abs Immature Granulocytes: 0.04 10*3/uL (ref 0.00–0.07)
Basophils Absolute: 0.1 10*3/uL (ref 0.0–0.1)
Basophils Relative: 1 %
Eosinophils Absolute: 0.3 10*3/uL (ref 0.0–0.5)
Eosinophils Relative: 4 %
HCT: 47.2 % (ref 39.0–52.0)
Hemoglobin: 14.7 g/dL (ref 13.0–17.0)
Immature Granulocytes: 1 %
Lymphocytes Relative: 23 %
Lymphs Abs: 1.6 10*3/uL (ref 0.7–4.0)
MCH: 26.5 pg (ref 26.0–34.0)
MCHC: 31.1 g/dL (ref 30.0–36.0)
MCV: 85.2 fL (ref 80.0–100.0)
Monocytes Absolute: 0.8 10*3/uL (ref 0.1–1.0)
Monocytes Relative: 11 %
Neutro Abs: 4.1 10*3/uL (ref 1.7–7.7)
Neutrophils Relative %: 60 %
Platelet Count: 321 10*3/uL (ref 150–400)
RBC: 5.54 MIL/uL (ref 4.22–5.81)
RDW: 14.3 % (ref 11.5–15.5)
WBC Count: 6.8 10*3/uL (ref 4.0–10.5)
nRBC: 0 % (ref 0.0–0.2)

## 2023-06-21 LAB — CMP (CANCER CENTER ONLY)
ALT: 19 U/L (ref 0–44)
AST: 27 U/L (ref 15–41)
Albumin: 3.7 g/dL (ref 3.5–5.0)
Alkaline Phosphatase: 81 U/L (ref 38–126)
Anion gap: 4 — ABNORMAL LOW (ref 5–15)
BUN: 8 mg/dL (ref 6–20)
CO2: 25 mmol/L (ref 22–32)
Calcium: 8.5 mg/dL — ABNORMAL LOW (ref 8.9–10.3)
Chloride: 106 mmol/L (ref 98–111)
Creatinine: 1.05 mg/dL (ref 0.61–1.24)
GFR, Estimated: >60 mL/min (ref >60)
Glucose, Bld: 105 mg/dL — ABNORMAL HIGH (ref 70–99)
Potassium: 4.2 mmol/L (ref 3.5–5.1)
Sodium: 135 mmol/L (ref 135–145)
Total Bilirubin: 0.3 mg/dL (ref 0.0–1.2)
Total Protein: 7.6 g/dL (ref 6.5–8.1)

## 2023-06-21 LAB — FOLATE: Folate: >40 ng/mL (ref >5.9)

## 2023-06-21 LAB — VITAMIN B12: Vitamin B-12: 326 pg/mL (ref 180–914)

## 2023-06-23 ENCOUNTER — Inpatient Hospital Stay (HOSPITAL_BASED_OUTPATIENT_CLINIC_OR_DEPARTMENT_OTHER): Payer: Medicare Other | Admitting: Oncology

## 2023-06-23 ENCOUNTER — Inpatient Hospital Stay: Payer: Medicare Other

## 2023-06-23 ENCOUNTER — Encounter: Payer: Self-pay | Admitting: Oncology

## 2023-06-23 VITALS — BP 103/76 | HR 84 | Temp 97.7°F | Resp 18 | Wt 124.4 lb

## 2023-06-23 DIAGNOSIS — K824 Cholesterolosis of gallbladder: Secondary | ICD-10-CM

## 2023-06-23 DIAGNOSIS — F1010 Alcohol abuse, uncomplicated: Secondary | ICD-10-CM

## 2023-06-23 DIAGNOSIS — E538 Deficiency of other specified B group vitamins: Secondary | ICD-10-CM | POA: Diagnosis not present

## 2023-06-23 DIAGNOSIS — D509 Iron deficiency anemia, unspecified: Secondary | ICD-10-CM | POA: Diagnosis not present

## 2023-06-23 NOTE — Assessment & Plan Note (Signed)
alcohol cessation was discussed with patient.

## 2023-06-23 NOTE — Progress Notes (Signed)
 Hematology/Oncology Progress note [phone] [phone]    REASON FOR VISIT  follow-up for anemia and thrombocytopenia, vitamin B12 and folic acid  deficiency.  ASSESSMENT & PLAN:   Vitamin B12 deficiency continue B12 1000mcg supplementation.   Anemia has resolved.   Alcohol abuse alcohol cessation was discussed with patient.  Folate deficiency Folate level is normalized.  Recommend patient to continue Folic acid  daily as maintenance.   Gallbladder polyp Repeat US  RUQ August 2024 showed  Decrease in size of previously described possible polyps. This may reflect adherent sludge or tiny polyps measuring up to 3 mm. Given small size, no dedicated follow-up is recommended.  Hypocalcemia Recommend calcium and vitamin D  supplementation.   Patient is discharged from my clinic. I recommend patient to continue follow up with primary care physician. Patient may re-establish care in the future if clinically indicated.  All questions were answered. The patient knows to call the clinic with any problems, questions or concerns.  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  Repeat MRI showed stable lesion. I will hold off additional image at this point.  He follows up with neurology for seizure. He takes all his meds.   INTERVAL HISTORY Trevor Lambert is a 63 y.o.[place] who has above oncology history reviewed by me today presented for follow up visit for management of microcytic anemia,  Reports feeling well. Drinks 1 can of beer per day. No new complaints.    Review of Systems  Constitutional:  Negative for appetite change, chills, fatigue, fever and unexpected weight change.  HENT:   Negative for hearing loss and voice  change.   Eyes:  Negative for eye problems and icterus.  Respiratory:  Negative for chest tightness, cough and shortness of breath.   Cardiovascular:  Negative for chest pain and leg swelling.  Gastrointestinal:  Negative for abdominal distention and abdominal pain.  Endocrine: Negative for hot flashes.  Genitourinary:  Negative for difficulty urinating, dysuria and frequency.   Musculoskeletal:  Negative for arthralgias.  Skin:  Negative for itching and rash.  Neurological:  Negative for light-headedness and numbness.  Hematological:  Negative for adenopathy. Does not bruise/bleed easily.  Psychiatric/Behavioral:  Negative for confusion.       No Known Allergies   Past Medical History:  Diagnosis Date   Seizures (HCC)    Vitamin B12 deficiency 02/03/2019     Past Surgical History:  Procedure Laterality Date   ESOPHAGOGASTRODUODENOSCOPY (EGD) WITH PROPOFOL  N/A 11/07/2018   Procedure: ESOPHAGOGASTRODUODENOSCOPY (EGD) WITH PROPOFOL ;  Surgeon: Therisa Bi, MD;  Location: [facility] ENDOSCOPY;  Service: Gastroenterology;  Laterality: N/A;   NO PAST SURGERIES      Social History   Socioeconomic History   Marital status: Single    Spouse name: Not on file   Number of children: 1   Years of education: Not on file   Highest education level: Not on file  Occupational History   Not on file  Tobacco Use   Smoking status: Every Day    Current packs/day: 0.50    Average packs/day: 0.5 packs/day for 41.0 years (20.5 ttl pk-yrs)    Types: Cigarettes   Smokeless tobacco: Never  Vaping Use   Vaping status: Never Used  Substance and Sexual Activity   Alcohol use: Yes  Alcohol/week: 1.0 standard drink of alcohol    Types: 1 Cans of beer per week   Drug use: Not Currently   Sexual activity: Yes    Birth control/protection: None  Other Topics Concern   Not on file  Social History Narrative   Not on file   Social Drivers of Health   Financial Resource Strain: Low Risk   (03/09/2023)   Overall Financial Resource Strain (CARDIA)    Difficulty of Paying Living Expenses: Not hard at all  Food Insecurity: No Food Insecurity (03/16/2023)   Hunger Vital Sign    Worried About Running Out of Food in the Last Year: Never true    Ran Out of Food in the Last Year: Never true  Transportation Needs: No Transportation Needs (03/16/2023)   PRAPARE - Administrator, Civil Service (Medical): No    Lack of Transportation (Non-Medical): No  Physical Activity: Inactive (03/09/2023)   Exercise Vital Sign    Days of Exercise per Week: 0 days    Minutes of Exercise per Session: 0 min  Stress: No Stress Concern Present (03/09/2023)   [employer] of Occupational Health - Occupational Stress Questionnaire    Feeling of Stress : Not at all  Social Connections: Socially Isolated (03/09/2023)   Social Connection and Isolation Panel [NHANES]    Frequency of Communication with Friends and Family: Once a week    Frequency of Social Gatherings with Friends and Family: More than three times a week    Attends Religious Services: Never    Database Administrator or Organizations: No    Attends Banker Meetings: Never    Marital Status: Never married  Intimate Partner Violence: Not At Risk (03/16/2023)   Humiliation, Afraid, Rape, and Kick questionnaire    Fear of Current or Ex-Partner: No    Emotionally Abused: No    Physically Abused: No    Sexually Abused: No    Family History  Problem Relation Age of Onset   Cancer Neg Hx      Current Outpatient Medications:    albuterol  (VENTOLIN  HFA) 108 (90 Base) MCG/ACT inhaler, , Disp: , Rfl:    folic acid  (FOLVITE ) 800 MCG tablet, Take 1 tablet (800 mcg total) by mouth daily., Disp: 90 tablet, Rfl: 3   levETIRAcetam  (KEPPRA ) 750 MG tablet, Take 3 tablets (2,250 mg total) by mouth daily., Disp: 270 tablet, Rfl: 4   levothyroxine  (SYNTHROID ) 50 MCG tablet, Take 1 tablet (50 mcg total) by mouth daily before  breakfast., Disp: 90 tablet, Rfl: 1   phenytoin  (DILANTIN ) 100 MG ER capsule, Take 3 capsules (300 mg total) by mouth daily., Disp: 270 capsule, Rfl: 4   vitamin B-12 (CYANOCOBALAMIN ) 1000 MCG tablet, Take 1 tablet (1,000 mcg total) by mouth daily., Disp: 90 tablet, Rfl: 3   Vitamin D , Ergocalciferol , (DRISDOL ) 1.25 MG (50000 UNIT) CAPS capsule, Take 1 capsule (50,000 Units total) by mouth every 7 (seven) days., Disp: 12 capsule, Rfl: 1  Physical exam: ECOG 1 Vitals:   06/23/23 0958  BP: 103/76  Pulse: 84  Resp: 18  Temp: 97.7 F (36.5 C)  TempSrc: Tympanic  SpO2: 99%  Weight: 124 lb 6.4 oz (56.4 kg)   Physical Exam Constitutional:      General: He is not in acute distress. HENT:     Head: Normocephalic and atraumatic.  Eyes:     General: No scleral icterus.    Pupils: Pupils are equal, round, and reactive to light.  Cardiovascular:     Rate and Rhythm: Normal rate and regular rhythm.     Heart sounds: Normal heart sounds.  Pulmonary:     Effort: Pulmonary effort is normal. No respiratory distress.     Breath sounds: No wheezing.  Abdominal:     General: Bowel sounds are normal. There is no distension.     Palpations: Abdomen is soft. There is no mass.     Tenderness: There is no abdominal tenderness.  Musculoskeletal:        General: No deformity. Normal range of motion.     Cervical back: Normal range of motion and neck supple.  Skin:    General: Skin is warm and dry.     Findings: No erythema or rash.  Neurological:     Mental Status: He is alert and oriented to person, place, and time. Mental status is at baseline.     Cranial Nerves: No cranial nerve deficit.     Coordination: Coordination normal.  Psychiatric:        Mood and Affect: Mood normal.        Behavior: Behavior normal.        Thought Content: Thought content normal.        Latest Ref Rng & Units 06/21/2023    9:08 AM  CMP  Glucose 70 - 99 mg/dL 894   BUN 6 - 20 mg/dL 8   Creatinine 9.38 - 8.75  mg/dL 8.94   Sodium 864 - 854 mmol/L 135   Potassium 3.5 - 5.1 mmol/L 4.2   Chloride 98 - 111 mmol/L 106   CO2 22 - 32 mmol/L 25   Calcium 8.9 - 10.3 mg/dL 8.5   Total Protein 6.5 - 8.1 g/dL 7.6   Total Bilirubin 0.0 - 1.2 mg/dL 0.3   Alkaline Phos 38 - 126 U/L 81   AST 15 - 41 U/L 27   ALT 0 - 44 U/L 19       Latest Ref Rng & Units 06/21/2023    9:08 AM  CBC  WBC 4.0 - 10.5 K/uL 6.8   Hemoglobin 13.0 - 17.0 g/dL 85.2   Hematocrit 60.9 - 52.0 % 47.2   Platelets 150 - 400 K/uL 321     No results found.

## 2023-06-23 NOTE — Assessment & Plan Note (Signed)
 Recommend calcium and vitamin D supplementation.

## 2023-06-23 NOTE — Assessment & Plan Note (Addendum)
continue B12 supplementation.   Anemia has resolved.

## 2023-06-23 NOTE — Assessment & Plan Note (Signed)
 Repeat US  RUQ August 2024 showed  Decrease in size of previously described possible polyps. This may reflect adherent sludge or tiny polyps measuring up to 3 mm. Given small size, no dedicated follow-up is recommended.

## 2023-06-23 NOTE — Assessment & Plan Note (Signed)
Folate level is normalized.  Recommend patient to continue Folic acid daily as maintenance.

## 2023-07-05 ENCOUNTER — Encounter: Payer: Self-pay | Admitting: Family Medicine

## 2023-07-05 ENCOUNTER — Ambulatory Visit (INDEPENDENT_AMBULATORY_CARE_PROVIDER_SITE_OTHER): Payer: Medicare Other | Admitting: Family Medicine

## 2023-07-05 VITALS — BP 114/75 | HR 69 | Temp 98.4°F | Resp 16 | Ht 68.0 in

## 2023-07-05 DIAGNOSIS — E559 Vitamin D deficiency, unspecified: Secondary | ICD-10-CM | POA: Diagnosis not present

## 2023-07-05 DIAGNOSIS — Z720 Tobacco use: Secondary | ICD-10-CM

## 2023-07-05 DIAGNOSIS — F1010 Alcohol abuse, uncomplicated: Secondary | ICD-10-CM

## 2023-07-05 DIAGNOSIS — E039 Hypothyroidism, unspecified: Secondary | ICD-10-CM

## 2023-07-05 DIAGNOSIS — Z5320 Procedure and treatment not carried out because of patient's decision for unspecified reasons: Secondary | ICD-10-CM

## 2023-07-05 DIAGNOSIS — R569 Unspecified convulsions: Secondary | ICD-10-CM

## 2023-07-05 DIAGNOSIS — K7030 Alcoholic cirrhosis of liver without ascites: Secondary | ICD-10-CM

## 2023-07-05 DIAGNOSIS — Z716 Tobacco abuse counseling: Secondary | ICD-10-CM

## 2023-07-05 DIAGNOSIS — Z09 Encounter for follow-up examination after completed treatment for conditions other than malignant neoplasm: Secondary | ICD-10-CM | POA: Diagnosis not present

## 2023-07-05 NOTE — Assessment & Plan Note (Addendum)
Has cut back to 2 beers per week; otherwise drinking soda, tea, kool-aid, chocolate milk and water

## 2023-07-05 NOTE — Assessment & Plan Note (Addendum)
Noted.  Patient endorses cutting back to 2 beers (and no other EtOH sources) per week.  Continue to monitor.

## 2023-07-05 NOTE — Assessment & Plan Note (Signed)
Not currently taking thyroid medication. Blood work planned to check thyroid levels. - Order blood work to check thyroid levels - Send thyroid medication prescription to pharmacy if levels indicate need

## 2023-07-05 NOTE — Assessment & Plan Note (Signed)
Counseled patient on his risk of lung cancer and the benefit of potentially identifying lung cancer early; patient continued to decline screening.

## 2023-07-05 NOTE — Assessment & Plan Note (Addendum)
Follows with neurology. No seizures since last visit. On Keppra - reports taking 1500 mg twice daily (3 tablets each time from 500 mg bottle he brought with him).

## 2023-07-05 NOTE — Progress Notes (Signed)
Established patient visit   Patient: Trevor Lambert   DOB: Feb 18, 1961   63 y.o. Male  MRN: [id number] Visit Date: 07/05/2023  Today's healthcare provider: Sherlyn Hay, DO   Chief Complaint  Patient presents with   Medical Management of Chronic Issues   Subjective    HPI Trevor Lambert is a 63 year old male who presents for a routine follow-up visit.  He is a very poor historian.  He has not experienced any seizures since his last visit and takes his seizure medication twice daily, at noon and nighttime. He maintains regular follow-up with neurology.  He smokes one pack of cigarettes per day and has no interest in quitting. He has not undergone lung cancer screening and has no current plans to do so.  No chest pain or shortness of breath. He drinks beer two days per week, typically on Thursday and Friday, and has been reducing his alcohol consumption. He also drinks water, tea, Kool-Aid, and chocolate milk.  He is not currently taking vitamin D or thyroid medication, reportedly having run out of his thyroid medication. He uses his inhaler infrequently, stating 'every blue moon'.     Medications: Outpatient Medications Prior to Visit  Medication Sig   albuterol (VENTOLIN HFA) 108 (90 Base) MCG/ACT inhaler    folic acid (FOLVITE) 800 MCG tablet Take 1 tablet (800 mcg total) by mouth daily.   levETIRAcetam (KEPPRA) 750 MG tablet Take 3 tablets (2,250 mg total) by mouth daily.   levothyroxine (SYNTHROID) 50 MCG tablet Take 1 tablet (50 mcg total) by mouth daily before breakfast.   phenytoin (DILANTIN) 100 MG ER capsule Take 3 capsules (300 mg total) by mouth daily.   vitamin B-12 (CYANOCOBALAMIN) 1000 MCG tablet Take 1 tablet (1,000 mcg total) by mouth daily.   Vitamin D, Ergocalciferol, (DRISDOL) 1.25 MG (50000 UNIT) CAPS capsule Take 1 capsule (50,000 Units total) by mouth every 7 (seven) days.   No facility-administered medications prior to visit.    Review of Systems   Respiratory: Negative.  Negative for cough, shortness of breath and wheezing.   Cardiovascular:  Negative for chest pain, palpitations and leg swelling.  Neurological:  Negative for weakness and headaches.         Objective    BP 114/75 (BP Location: Left Arm, Patient Position: Sitting, Cuff Size: Normal)   Pulse 69   Temp 98.4 F (36.9 C) (Oral)   Resp 16   Ht 5\' 8"  (1.727 m)   SpO2 100%   BMI 18.91 kg/m     Physical Exam Vitals and nursing note reviewed.  Constitutional:      General: He is not in acute distress.    Appearance: Normal appearance.  HENT:     Head: Normocephalic and atraumatic.  Eyes:     General: No scleral icterus.    Conjunctiva/sclera: Conjunctivae normal.  Cardiovascular:     Rate and Rhythm: Normal rate.  Pulmonary:     Effort: Pulmonary effort is normal.  Neurological:     Mental Status: He is alert and oriented to person, place, and time. Mental status is at baseline.  Psychiatric:        Mood and Affect: Mood normal.        Behavior: Behavior normal.      No results found for any visits on 07/05/23.  Assessment & Plan    Follow-up exam, 3-6 months since previous exam Assessment & Plan: Has not received COVID, pneumonia, or shingles  vaccines. Discussed benefits and risks, including increased hospitalization risk from pneumonia and severe complications of shingles. - Encourage consideration of COVID, pneumonia, and shingles vaccines, available at the pharmacy   Vitamin D deficiency Assessment & Plan: Not currently taking vitamin D; I suspect he may not have taken what was sent previously. Very low levels (7s) previously; will recheck vitamin D levels. - Order blood work to check vitamin D levels - Send vitamin D prescription to pharmacy if levels are low  Orders: -     VITAMIN D 25 Hydroxy (Vit-D Deficiency, Fractures)  Acquired hypothyroidism Assessment & Plan: Not currently taking thyroid medication. Blood work planned to  check thyroid levels. - Order blood work to check thyroid levels - Send thyroid medication prescription to pharmacy if levels indicate need  Orders: -     TSH  Alcohol abuse Assessment & Plan: Has cut back to 2 beers per week; otherwise drinking soda, tea, kool-aid, chocolate milk and water   Seizures (HCC) Assessment & Plan: Follows with neurology. No seizures since last visit. On Keppra - reports taking 1500 mg twice daily (3 tablets each time from 500 mg bottle he brought with him).   Declined smoking cessation  Encounter for smoking cessation counseling Assessment & Plan: Smokes one pack per day. Discussed risks including lung cancer and benefits of early detection through screening. - Discuss smoking cessation options including nicotine patches, gum, lozenges, Chantix, and Wellbutrin - Patient continues to decline interest in cessation at this time.   Lung cancer screening declined by patient Assessment & Plan: Counseled patient on his risk of lung cancer and the benefit of potentially identifying lung cancer early; patient continued to decline screening.   Alcoholic cirrhosis of liver without ascites (HCC) Assessment & Plan: Noted.  Patient endorses cutting back to 2 beers (and no other EtOH sources) per week.  Continue to monitor.   Moderate Cardiovascular Risk 9% risk of heart attack or stroke in the next ten years. No chest pain or dyspnea. Prefers diet and exercise changes over cholesterol medication.  - Declined calcium heart scoring CT due to preference. - Encourage increased physical activity and consumption of fresh fruits and vegetables - Monitor cardiovascular risk factors   Return in about 6 months (around 01/02/2024) for chronic f/u.      I discussed the assessment and treatment plan with the patient  The patient was provided an opportunity to ask questions and all were answered. The patient agreed with the plan and demonstrated an understanding of the  instructions.   The patient was advised to call back or seek an in-person evaluation if the symptoms worsen or if the condition fails to improve as anticipated.    Sherlyn Hay, DO  [facility] [phone] (phone) [phone] (fax)  [facility]

## 2023-07-05 NOTE — Assessment & Plan Note (Signed)
Has not received COVID, pneumonia, or shingles vaccines. Discussed benefits and risks, including increased hospitalization risk from pneumonia and severe complications of shingles. - Encourage consideration of COVID, pneumonia, and shingles vaccines, available at the pharmacy

## 2023-07-05 NOTE — Assessment & Plan Note (Signed)
Not currently taking vitamin D; I suspect he may not have taken what was sent previously. Very low levels (7s) previously; will recheck vitamin D levels. - Order blood work to check vitamin D levels - Send vitamin D prescription to pharmacy if levels are low

## 2023-07-05 NOTE — Assessment & Plan Note (Signed)
Smokes one pack per day. Discussed risks including lung cancer and benefits of early detection through screening. - Discuss smoking cessation options including nicotine patches, gum, lozenges, Chantix, and Wellbutrin - Patient continues to decline interest in cessation at this time.

## 2023-07-05 NOTE — Patient Instructions (Signed)
Request refills from pharmacy before you run out of medications as these are medications you need to continue to take regularly.

## 2023-07-06 LAB — VITAMIN D 25 HYDROXY (VIT D DEFICIENCY, FRACTURES): Vit D, 25-Hydroxy: 5.1 ng/mL — ABNORMAL LOW (ref 30.0–100.0)

## 2023-07-06 LAB — TSH: TSH: 6.14 u[IU]/mL — ABNORMAL HIGH (ref 0.450–4.500)

## 2023-07-13 ENCOUNTER — Encounter: Payer: Self-pay | Admitting: Family Medicine

## 2023-07-13 MED ORDER — VITAMIN D (ERGOCALCIFEROL) 1.25 MG (50000 UNIT) PO CAPS: 50000.0000 [IU] | ORAL_CAPSULE | ORAL | 1 refills | Status: AC

## 2023-07-13 NOTE — Addendum Note (Signed)
 Addended by: Jacquenette Shone on: 07/13/2023 09:02 AM   Modules accepted: Orders

## 2023-07-20 MED ORDER — LEVOTHYROXINE SODIUM 50 MCG PO TABS: 50.0000 ug | ORAL_TABLET | Freq: Every day | ORAL | 1 refills | Status: AC

## 2023-11-01 ENCOUNTER — Encounter: Payer: Self-pay | Admitting: Neurology

## 2023-11-01 ENCOUNTER — Ambulatory Visit (INDEPENDENT_AMBULATORY_CARE_PROVIDER_SITE_OTHER): Payer: Medicare Other | Admitting: Neurology

## 2023-11-01 VITALS — BP 126/84 | HR 60 | Resp 15

## 2023-11-01 DIAGNOSIS — G40909 Epilepsy, unspecified, not intractable, without status epilepticus: Secondary | ICD-10-CM

## 2023-11-01 MED ORDER — LEVETIRACETAM 750 MG PO TABS: 2250.0000 mg | ORAL_TABLET | Freq: Every day | ORAL | 4 refills | Status: AC

## 2023-11-01 MED ORDER — PHENYTOIN SODIUM EXTENDED 100 MG PO CAPS: 300.0000 mg | ORAL_CAPSULE | Freq: Every day | ORAL | 3 refills | Status: AC

## 2023-11-01 NOTE — Progress Notes (Signed)
 [facility]  PATIENT: Trevor Lambert DOB: Jun 28, 1960  REQUESTING CLINICIAN: Carlean Charter, DO HISTORY FROM: Patient and mother  REASON FOR VISIT: Establish care for epilepsy    HISTORICAL  CHIEF COMPLAINT:  Chief Complaint  Patient presents with   Seizures    Rm12, alone, Sz: denied recent sz, doing well. Pt stated that he ran out do the dilantin  last week so he decided to add 500mg  keppra  at noon to try to accomodate   INTERVAL HISTORY 11/01/2023 Trevor Lambert presents today for follow-up, last visit was in November.  Since then he tells me he has been doing well, has not had any seizure or seizure like activity.  He is compliant with his levetiracetam  and phenytoin  but 2 weeks ago ran out of Phenytoin  therefore he has been taking additional 1000 mg of Keppra .  Fortunately for patient no seizures.  Currently he has no questions, tells me that he is doing well, no additional concerns.   INTERVAL HISTORY 03/31/2023:  Trevor Lambert presents today for follow-up, last visit was in May since then he has been doing well, denies any seizure or seizure like activity.  He is on Keppra  2250 mg daily and Dilantin  300 mg daily.  He reports running out of Dilantin  a month ago but has been using her old bottle of Keppra  500 mg, take an additional 2 tablets.  So basically he is taking a total of 3250 mg of Keppra .  No side effect and luckily no seizure for patient.    INTERVAL HISTORY 09/16/2022:  Trevor Lambert presents today for follow-up, he is alone.  Last visit was in January.  At that time plan was to continue hi phenytoin  and to increase his Keppra  to 2000 mg. He has not have any issue taking his phenytoin  but if there is always confusion about the Keppra .  Instead of taking 2000 mg he is only taking 750.  He did have a breakthrough seizure 2 weeks ago.  He report has only seizure that he had since January.   INTERVAL HISTORY 06/10/2022 Trevor Lambert presents today for follow-up, he is alone today.  At  last visit plan was to continue with phenytoin  300 mg daily and Keppra  1000 mg daily but he showed me an old bottle of Keppra  500 mg, and states that he has been taking 1 tablet a day.   He reports 1 breakthrough seizure 2 weeks ago, denies any provoking factors.  He reports compliance with his medication even though at last visit his antiseizure level showed a Keppra  level less than 2.  Denies any side effect from the medication.  No other complaints or concerns.   INTERVAL HISTORY 02/26/2022:  Trevor Lambert presents today for follow-up.  Today he is alone.  His last visit was in July.  Since July he has been doing well, reports compliance with phenytoin  300 mg daily and Keppra  1000 mg daily.  He reported he had seizures 2 weeks ago, he fell off his bed, denies any major injury.  He did not go to the hospital.  He did not call the office to let us  know.  He reports on the specific day he has not been drinking heavily, denies any recent infection or fevers.  He reports that he cut his alcohol drink to 1 beer daily.  Previously he was drinking up to 5 beers per day.   INTERVAL HISTORY 11/26/21 Trevor Lambert presents today for follow-up, he is accompanied by his mother.  He reported being compliant with his medication, he  takes phenytoin  300 mg daily and Keppra  extended release 500 mg daily into the 1000 mg daily.  He does also take additional Keppra  500 mg daily.  He reported he has been doing well except 2 weeks ago he did have a seizure.  He report reports compliance with his medication.  He continues to drink reported he drink about 3X40 ounce beer 5 times a week.   HISTORY OF PRESENT ILLNESS:  This is a 63 year old man with PMHx of seizure disorder since the age of 7, alcohol abuse who is presenting to establish care. Patient reports history of seizure and medications non adherence, currently is on Phenytoin  300 mg and Keppra  500 mg TID but but again he has been taking in inconsistently. Last seizure was reported 2  weeks ago, when he believe that he had back to back seizures. Patient reports that he feel in the bathroom and has to crawl back to bed. Denies any injuries. He denies any seizure risk factor other than grandmother had seizures. He described his seizures as grand mal seizure, generalized convulsion.  He does also report long history of alcohol abuse. Reports drinking 3 to 4 days out of the week, 2 40 oz of beer. Denies any alcohol withdrawal seizures.  Currently patient is not driving.   Handedness: Right hand  Onset:Since the age of 19   Seizure Type: Generalized   Current frequency: Unclear, patient reports last seizure was 2 weeks ago   Any injuries from seizures: None   Seizure risk factors: Grandmother   Previous ASMs: Phenytoin , Levetiracetam    Currenty ASMs: Phenytoin  300 mg daily and Levetiracetam  2250 mg daily   ASMs side effects: Denies   Brain Images: Small vessel disease, no acute intracranial abnormality.   Previous EEGs: None available for review    OTHER MEDICAL CONDITIONS: Seizure, alcohol abuse, hyperlipidemia   REVIEW OF SYSTEMS: Full 14 system review of systems performed and negative with exception of: as noted in the HPI   ALLERGIES: No Known Allergies  HOME MEDICATIONS: Outpatient Medications Prior to Visit  Medication Sig Dispense Refill   albuterol  (VENTOLIN  HFA) 108 (90 Base) MCG/ACT inhaler      folic acid  (FOLVITE ) 800 MCG tablet Take 1 tablet (800 mcg total) by mouth daily. 90 tablet 3   levothyroxine  (SYNTHROID ) 50 MCG tablet Take 1 tablet (50 mcg total) by mouth daily before breakfast. 90 tablet 1   vitamin B-12 (CYANOCOBALAMIN ) 1000 MCG tablet Take 1 tablet (1,000 mcg total) by mouth daily. 90 tablet 3   Vitamin D , Ergocalciferol , (DRISDOL ) 1.25 MG (50000 UNIT) CAPS capsule Take 1 capsule (50,000 Units total) by mouth 2 (two) times a week. 24 capsule 1   levETIRAcetam  (KEPPRA ) 500 MG tablet Take 500 mg by mouth daily at 12 noon.      levETIRAcetam  (KEPPRA ) 750 MG tablet Take 3 tablets (2,250 mg total) by mouth daily. 270 tablet 4   phenytoin  (DILANTIN ) 100 MG ER capsule Take 3 capsules (300 mg total) by mouth daily. (Patient not taking: Reported on 11/01/2023) 270 capsule 4   No facility-administered medications prior to visit.    PAST MEDICAL HISTORY: Past Medical History:  Diagnosis Date   Seizures (HCC)    Vitamin B12 deficiency 02/03/2019    PAST SURGICAL HISTORY: Past Surgical History:  Procedure Laterality Date   ESOPHAGOGASTRODUODENOSCOPY (EGD) WITH PROPOFOL  N/A 11/07/2018   Procedure: ESOPHAGOGASTRODUODENOSCOPY (EGD) WITH PROPOFOL ;  Surgeon: Luke Salaam, MD;  Location: [facility] END[facility]COPY;  Service: Gastroenterology;  Laterality: N/A;  NO PAST SURGERIES      FAMILY HISTORY: Family History  Problem Relation Age of Onset   Cancer Neg Hx     SOCIAL HISTORY: Social History   Socioeconomic History   Marital status: Single    Spouse name: Not on file   Number of children: 1   Years of education: Not on file   Highest education level: Not on file  Occupational History   Not on file  Tobacco Use   Smoking status: Every Day    Current packs/day: 0.50    Average packs/day: 0.5 packs/day for 41.0 years (20.5 ttl pk-yrs)    Types: Cigarettes   Smokeless tobacco: Never  Vaping Use   Vaping status: Never Used  Substance and Sexual Activity   Alcohol use: Yes    Alcohol/week: 1.0 standard drink of alcohol    Types: 1 Cans of beer per week   Drug use: Not Currently   Sexual activity: Yes    Birth control/protection: None  Other Topics Concern   Not on file  Social History Narrative   Not on file   Social Drivers of Health   Financial Resource Strain: Low Risk  (03/09/2023)   Overall Financial Resource Strain (CARDIA)    Difficulty of Paying Living Expenses: Not hard at all  Food Insecurity: No Food Insecurity (03/16/2023)   Hunger Vital Sign    Worried About Running Out of Food in the Last  Year: Never true    Ran Out of Food in the Last Year: Never true  Transportation Needs: No Transportation Needs (03/16/2023)   PRAPARE - Administrator, Civil Service (Medical): No    Lack of Transportation (Non-Medical): No  Physical Activity: Inactive (03/09/2023)   Exercise Vital Sign    Days of Exercise per Week: 0 days    Minutes of Exercise per Session: 0 min  Stress: No Stress Concern Present (03/09/2023)   [employer] of Occupational Health - Occupational Stress Questionnaire    Feeling of Stress : Not at all  Social Connections: Socially Isolated (03/09/2023)   Social Connection and Isolation Panel    Frequency of Communication with Friends and Family: Once a week    Frequency of Social Gatherings with Friends and Family: More than three times a week    Attends Religious Services: Never    Database administrator or Organizations: No    Attends Banker Meetings: Never    Marital Status: Never married  Intimate Partner Violence: Not At Risk (03/16/2023)   Humiliation, Afraid, Rape, and Kick questionnaire    Fear of Current or Ex-Partner: No    Emotionally Abused: No    Physically Abused: No    Sexually Abused: No    PHYSICAL EXAM  GENERAL EXAM/CONSTITUTIONAL: Vitals:  Vitals:   11/01/23 1040  BP: 126/84  Pulse: 60  Resp: 15  SpO2: 96%   There is no height or weight on file to calculate BMI. Wt Readings from Last 3 Encounters:  06/23/23 124 lb 6.4 oz (56.4 kg)  03/31/23 121 lb (54.9 kg)  03/09/23 120 lb 1.6 oz (54.5 kg)   Patient is in no distress; well developed, nourished and groomed; neck is supple  MUSCULOSKELETAL: Gait, strength, tone, movements noted in Neurologic exam below  NEUROLOGIC: MENTAL STATUS:      No data to display         awake, alert, oriented to person, place, date and month but not year. Could not tell me  the current [place]  president  Unable to spell PENCIL (stop school at 10 grade), unable to calculate  the number of quarter in $1,75 language fluent, comprehension intact, naming intact  CRANIAL NERVE:  2nd, 3rd, 4th, 6th - visual fields full to confrontation, extraocular muscles intact, no nystagmus 5th - facial sensation symmetric 7th - facial strength symmetric 8th - hearing intact 9th - palate elevates symmetrically, uvula midline 11th - shoulder shrug symmetric 12th - tongue protrusion midline  MOTOR:  normal bulk and tone, full strength in the BUE, BLE  SENSORY:  normal and symmetric to light touch  COORDINATION:  finger-nose-finger, fine finger movements normal  GAIT/STATION:  Walks with a cane    DIAGNOSTIC DATA (LABS, IMAGING, TESTING) - I reviewed patient records, labs, notes, testing and imaging myself where available.  Lab Results  Component Value Date   WBC 6.8 06/21/2023   HGB 14.7 06/21/2023   HCT 47.2 06/21/2023   MCV 85.2 06/21/2023   PLT 321 06/21/2023      Component Value Date/Time   NA 135 06/21/2023 0908   NA 141 01/12/2023 1410   K 4.2 06/21/2023 0908   CL 106 06/21/2023 0908   CO2 25 06/21/2023 0908   GLUCOSE 105 (H) 06/21/2023 0908   BUN 8 06/21/2023 0908   BUN 10 01/12/2023 1410   CREATININE 1.05 06/21/2023 0908   CALCIUM 8.5 (L) 06/21/2023 0908   PROT 7.6 06/21/2023 0908   PROT 7.0 01/12/2023 1410   ALBUMIN 3.7 06/21/2023 0908   ALBUMIN 4.1 01/12/2023 1410   AST 27 06/21/2023 0908   ALT 19 06/21/2023 0908   ALKPHOS 81 06/21/2023 0908   BILITOT 0.3 06/21/2023 0908   GFRNONAA >60 06/21/2023 0908   GFRAA 118 05/01/2019 1329   Lab Results  Component Value Date   CHOL 134 01/12/2023   HDL 75 01/12/2023   LDLCALC 44 01/12/2023   TRIG 73 01/12/2023   No results found for: HGBA1C Lab Results  Component Value Date   VITAMINB12 326 06/21/2023   Lab Results  Component Value Date   TSH 6.140 (H) 07/05/2023    Head CT 06/2019 1. Small left frontal scalp contusion with swelling. No underlying skull fracture. 2. Chronic  appearing minimal small vessel ischemic disease of periventricular white matter. No acute intracranial abnormality. 3. No acute posttraumatic cervical spine fracture or subluxation   Routine EEG 09/02/21 This is a normal EEG recording in the waking and drowsy state. No evidence of interictal epileptiform discharges seen. A normal EEG does not exclude a diagnosis of epilepsy.     ASSESSMENT AND PLAN  63 y.o. year old male  with PMHx of seizure disorder, alcohol abuse, hyperlipidemia here for follow up. He reports compliance with his medications Phenytoin  XR 300 mg daily and Keppra  2250 mg daily. Ran out of phenytoin  a couple weeks ago, therefore has been taking additional Keppra  1000 mg daily (500 mg X2 Old prescription). Plan will be for patient to go back to Keppra  2250 mg and Dilantin  300 mg daily. He voiced understanding, return in 6 to 8 months or sooner if worse.    1. Seizure disorder [facility])      Patient Instructions  Continue with Keppra  750 mg 3 tablets daily  Continue with phenytoin  ER 300 mg daily Continue follow-up with your PCP Return in 6 months or sooner if worse  Per [place]  DMV statutes, patients with seizures are not allowed to drive until they have been seizure-free for six months.  Other  recommendations include using caution when using heavy equipment or power tools. Avoid working on ladders or at heights. Take showers instead of baths.  Do not swim alone.  Ensure the water temperature is not too high on the home water heater. Do not go swimming alone. Do not lock yourself in a room alone (i.e. bathroom). When caring for infants or small children, sit down when holding, feeding, or changing them to minimize risk of injury to the child in the event you have a seizure. Maintain good sleep hygiene. Avoid alcohol.  Also recommend adequate sleep, hydration, good diet and minimize stress.   During the Seizure  - First, ensure adequate ventilation and place patients on  the floor on their left side  Loosen clothing around the neck and ensure the airway is patent. If the patient is clenching the teeth, do not force the mouth open with any object as this can cause severe damage - Remove all items from the surrounding that can be hazardous. The patient may be oblivious to what's happening and may not even know what he or she is doing. If the patient is confused and wandering, either gently guide him/her away and block access to outside areas - Reassure the individual and be comforting - Call 911. In most cases, the seizure ends before EMS arrives. However, there are cases when seizures may last over 3 to 5 minutes. Or the individual may have developed breathing difficulties or severe injuries. If a pregnant patient or a person with diabetes develops a seizure, it is prudent to call an ambulance. - Finally, if the patient does not regain full consciousness, then call EMS. Most patients will remain confused for about 45 to 90 minutes after a seizure, so you must use judgment in calling for help. - Avoid restraints but make sure the patient is in a bed with padded side rails - Place the individual in a lateral position with the neck slightly flexed; this will help the saliva drain from the mouth and prevent the tongue from falling backward - Remove all nearby furniture and other hazards from the area - Provide verbal assurance as the individual is regaining consciousness - Provide the patient with privacy if possible - Call for help and start treatment as ordered by the caregiver   After the Seizure (Postictal Stage)  After a seizure, most patients experience confusion, fatigue, muscle pain and/or a headache. Thus, one should permit the individual to sleep. For the next few days, reassurance is essential. Being calm and helping reorient the person is also of importance.  Most seizures are painless and end spontaneously. Seizures are not harmful to others but can lead to  complications such as stress on the lungs, brain and the heart. Individuals with prior lung problems may develop labored breathing and respiratory distress.     No orders of the defined types were placed in this encounter.   Meds ordered this encounter  Medications   phenytoin  (DILANTIN ) 100 MG ER capsule    Sig: Take 3 capsules (300 mg total) by mouth daily.    Dispense:  270 capsule    Refill:  3   levETIRAcetam  (KEPPRA ) 750 MG tablet    Sig: Take 3 tablets (2,250 mg total) by mouth daily.    Dispense:  270 tablet    Refill:  4    Return in about 6 months (around 05/02/2024).   Cassandra Cleveland, MD 11/01/2023, 11:40 AM  [facility] [address], [address] [place],  [place] [address] [phone]

## 2023-11-01 NOTE — Patient Instructions (Addendum)
 Continue with Keppra  750 mg 3 tablets daily  Continue with phenytoin  ER 300 mg daily Continue follow-up with your PCP Return in 6 months or sooner if worse

## 2023-11-09 ENCOUNTER — Encounter: Payer: Self-pay | Admitting: Family Medicine

## 2024-01-03 ENCOUNTER — Ambulatory Visit: Payer: Medicare Other | Admitting: Family Medicine

## 2024-06-26 ENCOUNTER — Ambulatory Visit: Admitting: Neurology
